# Patient Record
Sex: Female | Born: 1965
Health system: Southern US, Community
[De-identification: ages and names within clinical notes are randomized; demographics above are authoritative.]

## PROBLEM LIST (undated history)

## (undated) DIAGNOSIS — E119 Type 2 diabetes mellitus without complications: Secondary | ICD-10-CM

## (undated) DIAGNOSIS — N189 Chronic kidney disease, unspecified: Secondary | ICD-10-CM

## (undated) DIAGNOSIS — F419 Anxiety disorder, unspecified: Secondary | ICD-10-CM

## (undated) DIAGNOSIS — D649 Anemia, unspecified: Secondary | ICD-10-CM

## (undated) DIAGNOSIS — I10 Essential (primary) hypertension: Secondary | ICD-10-CM

## (undated) HISTORY — DX: Chronic kidney disease, unspecified: N18.9

## (undated) HISTORY — DX: Type 2 diabetes mellitus without complications: E11.9

## (undated) HISTORY — DX: Anxiety disorder, unspecified: F41.9

## (undated) HISTORY — DX: Anemia, unspecified: D64.9

## (undated) HISTORY — PX: OTHER SURGICAL HISTORY: SHX169

---

## 1998-10-19 ENCOUNTER — Encounter: Payer: Self-pay | Admitting: Internal Medicine

## 1998-10-19 ENCOUNTER — Ambulatory Visit (HOSPITAL_COMMUNITY): Admission: RE | Admit: 1998-10-19 | Discharge: 1998-10-19 | Payer: Self-pay | Admitting: Internal Medicine

## 1999-01-21 ENCOUNTER — Encounter: Payer: Self-pay | Admitting: Internal Medicine

## 1999-01-21 ENCOUNTER — Ambulatory Visit (HOSPITAL_COMMUNITY): Admission: RE | Admit: 1999-01-21 | Discharge: 1999-01-21 | Payer: Self-pay | Admitting: Internal Medicine

## 2000-10-23 ENCOUNTER — Other Ambulatory Visit: Admission: RE | Admit: 2000-10-23 | Discharge: 2000-10-23 | Payer: Self-pay | Admitting: *Deleted

## 2000-11-21 ENCOUNTER — Other Ambulatory Visit: Admission: RE | Admit: 2000-11-21 | Discharge: 2000-11-21 | Payer: Self-pay | Admitting: *Deleted

## 2000-11-21 ENCOUNTER — Encounter (INDEPENDENT_AMBULATORY_CARE_PROVIDER_SITE_OTHER): Payer: Self-pay

## 2001-12-22 ENCOUNTER — Other Ambulatory Visit: Admission: RE | Admit: 2001-12-22 | Discharge: 2001-12-22 | Payer: Self-pay | Admitting: Obstetrics and Gynecology

## 2004-06-02 ENCOUNTER — Other Ambulatory Visit: Admission: RE | Admit: 2004-06-02 | Discharge: 2004-06-02 | Payer: Self-pay | Admitting: Obstetrics and Gynecology

## 2005-02-26 ENCOUNTER — Ambulatory Visit: Payer: Self-pay | Admitting: Internal Medicine

## 2005-06-07 ENCOUNTER — Other Ambulatory Visit: Admission: RE | Admit: 2005-06-07 | Discharge: 2005-06-07 | Payer: Self-pay | Admitting: Obstetrics and Gynecology

## 2005-06-14 ENCOUNTER — Ambulatory Visit (HOSPITAL_COMMUNITY): Admission: RE | Admit: 2005-06-14 | Discharge: 2005-06-14 | Payer: Self-pay | Admitting: Obstetrics and Gynecology

## 2005-09-07 ENCOUNTER — Ambulatory Visit: Payer: Self-pay | Admitting: Internal Medicine

## 2006-02-13 ENCOUNTER — Ambulatory Visit: Payer: Self-pay | Admitting: Internal Medicine

## 2006-10-14 ENCOUNTER — Ambulatory Visit: Payer: Self-pay | Admitting: Internal Medicine

## 2007-04-16 ENCOUNTER — Ambulatory Visit: Payer: Self-pay | Admitting: Internal Medicine

## 2007-06-24 ENCOUNTER — Ambulatory Visit: Payer: Self-pay | Admitting: Internal Medicine

## 2007-06-24 LAB — CONVERTED CEMR LAB
AST: 20 units/L (ref 0–37)
Albumin: 3.5 g/dL (ref 3.5–5.2)
Basophils Relative: 0.5 % (ref 0.0–1.0)
Bilirubin, Direct: 0.1 mg/dL (ref 0.0–0.3)
CO2: 29 meq/L (ref 19–32)
Chloride: 99 meq/L (ref 96–112)
Creatinine, Ser: 0.9 mg/dL (ref 0.4–1.2)
Eosinophils Relative: 1.3 % (ref 0.0–5.0)
Glucose, Bld: 94 mg/dL (ref 70–99)
HCT: 37.5 % (ref 36.0–46.0)
Hemoglobin, Urine: NEGATIVE
Hemoglobin: 12.5 g/dL (ref 12.0–15.0)
Iron: 38 ug/dL — ABNORMAL LOW (ref 42–145)
Ketones, ur: NEGATIVE mg/dL
Leukocytes, UA: NEGATIVE
MCHC: 33.3 g/dL (ref 30.0–36.0)
Monocytes Absolute: 0.6 10*3/uL (ref 0.2–0.7)
Neutrophils Relative %: 63.2 % (ref 43.0–77.0)
Nitrite: NEGATIVE
Potassium: 3.7 meq/L (ref 3.5–5.1)
RBC: 4.38 M/uL (ref 3.87–5.11)
RDW: 16.3 % — ABNORMAL HIGH (ref 11.5–14.6)
Sodium: 135 meq/L (ref 135–145)
Total Bilirubin: 0.4 mg/dL (ref 0.3–1.2)
Total Protein: 6.9 g/dL (ref 6.0–8.3)
Urobilinogen, UA: 0.2 (ref 0.0–1.0)
Vit D, 1,25-Dihydroxy: 52 (ref 20–57)
Vitamin B-12: 317 pg/mL (ref 211–911)
WBC: 5.1 10*3/uL (ref 4.5–10.5)
pH: 6 (ref 5.0–8.0)

## 2007-07-30 ENCOUNTER — Ambulatory Visit: Payer: Self-pay | Admitting: Internal Medicine

## 2007-10-27 ENCOUNTER — Telehealth: Payer: Self-pay | Admitting: Internal Medicine

## 2007-11-17 ENCOUNTER — Telehealth: Payer: Self-pay | Admitting: Internal Medicine

## 2007-11-18 ENCOUNTER — Ambulatory Visit: Payer: Self-pay | Admitting: Internal Medicine

## 2007-11-18 DIAGNOSIS — I10 Essential (primary) hypertension: Secondary | ICD-10-CM | POA: Insufficient documentation

## 2007-11-18 DIAGNOSIS — D259 Leiomyoma of uterus, unspecified: Secondary | ICD-10-CM | POA: Insufficient documentation

## 2008-03-30 ENCOUNTER — Encounter: Admission: RE | Admit: 2008-03-30 | Discharge: 2008-03-30 | Payer: Self-pay | Admitting: Obstetrics and Gynecology

## 2008-04-08 ENCOUNTER — Encounter: Admission: RE | Admit: 2008-04-08 | Discharge: 2008-04-08 | Payer: Self-pay | Admitting: Interventional Radiology

## 2008-05-27 ENCOUNTER — Telehealth: Payer: Self-pay | Admitting: Internal Medicine

## 2008-05-28 ENCOUNTER — Telehealth (INDEPENDENT_AMBULATORY_CARE_PROVIDER_SITE_OTHER): Payer: Self-pay | Admitting: *Deleted

## 2008-05-28 ENCOUNTER — Ambulatory Visit: Payer: Self-pay | Admitting: Internal Medicine

## 2008-05-28 ENCOUNTER — Ambulatory Visit (HOSPITAL_COMMUNITY): Admission: RE | Admit: 2008-05-28 | Discharge: 2008-05-28 | Payer: Self-pay | Admitting: Interventional Radiology

## 2008-06-09 ENCOUNTER — Telehealth (INDEPENDENT_AMBULATORY_CARE_PROVIDER_SITE_OTHER): Payer: Self-pay | Admitting: *Deleted

## 2008-06-18 ENCOUNTER — Ambulatory Visit (HOSPITAL_COMMUNITY): Admission: RE | Admit: 2008-06-18 | Discharge: 2008-06-19 | Payer: Self-pay | Admitting: Interventional Radiology

## 2008-07-06 ENCOUNTER — Encounter: Admission: RE | Admit: 2008-07-06 | Discharge: 2008-07-06 | Payer: Self-pay | Admitting: Interventional Radiology

## 2008-12-28 ENCOUNTER — Encounter: Admission: RE | Admit: 2008-12-28 | Discharge: 2008-12-28 | Payer: Self-pay | Admitting: Interventional Radiology

## 2009-01-18 ENCOUNTER — Telehealth: Payer: Self-pay | Admitting: Internal Medicine

## 2009-01-21 ENCOUNTER — Ambulatory Visit: Payer: Self-pay | Admitting: Internal Medicine

## 2009-05-28 ENCOUNTER — Telehealth: Payer: Self-pay | Admitting: Family Medicine

## 2009-05-30 ENCOUNTER — Telehealth: Payer: Self-pay | Admitting: Internal Medicine

## 2009-07-25 ENCOUNTER — Telehealth: Payer: Self-pay | Admitting: Internal Medicine

## 2009-07-27 ENCOUNTER — Ambulatory Visit: Payer: Self-pay | Admitting: Internal Medicine

## 2009-07-27 DIAGNOSIS — M542 Cervicalgia: Secondary | ICD-10-CM | POA: Insufficient documentation

## 2009-07-27 DIAGNOSIS — R071 Chest pain on breathing: Secondary | ICD-10-CM | POA: Insufficient documentation

## 2010-10-13 ENCOUNTER — Ambulatory Visit: Payer: Self-pay | Admitting: Internal Medicine

## 2010-10-13 LAB — CONVERTED CEMR LAB
Albumin: 3.5 g/dL (ref 3.5–5.2)
Alkaline Phosphatase: 73 units/L (ref 39–117)
CO2: 31 meq/L (ref 19–32)
Calcium: 9 mg/dL (ref 8.4–10.5)
Chloride: 103 meq/L (ref 96–112)
Creatinine, Ser: 0.7 mg/dL (ref 0.4–1.2)
Glucose, Bld: 82 mg/dL (ref 70–99)
Hgb A1c MFr Bld: 6.1 % (ref 4.6–6.5)
LDL Cholesterol: 62 mg/dL (ref 0–99)
Total Bilirubin: 0.2 mg/dL — ABNORMAL LOW (ref 0.3–1.2)
Total CHOL/HDL Ratio: 2
Triglycerides: 35 mg/dL (ref 0.0–149.0)

## 2010-10-17 ENCOUNTER — Encounter: Payer: Self-pay | Admitting: Internal Medicine

## 2010-11-06 ENCOUNTER — Telehealth: Payer: Self-pay | Admitting: Internal Medicine

## 2010-12-24 ENCOUNTER — Encounter: Payer: Self-pay | Admitting: Interventional Radiology

## 2011-01-04 NOTE — Letter (Signed)
[facility] [address] [place], [place]  [address] Phone: [phone]      October 17, 2010   [facility] [address] [place], [place] [address]  RE:  LAB RESULTS  Dear  Sydney [name],  The following is an interpretation of your most recent lab tests.  Please take note of any instructions provided or changes to medications that have resulted from your lab work.  ELECTROLYTES:  Good - no changes needed  KIDNEY FUNCTION TESTS:  Good - no changes needed  LIVER FUNCTION TESTS:  Good - no changes needed  Health professionals look at cholesterol as more involved than just the total cholesterol. We consider the level of LDL (bad) cholesterol, HDL (good), cholesterol, and Triglycerides (Grease) in the blood.  1. Your LDL should be under 100, and the HDL should be over 45, if you have any vascular disease such as heart attack, angina, stroke, TIA (mini stroke), claudication (pain in the legs when you walk due to poor circulation),  Abdominal Aortic Aneurysm (AAA), diabetes or prediabetes.  2. Your LDL should be under 130 if you have any two of the following:     a. Smoke or chew tobacco,     b. High blood pressure (if you are on medication or over 140/90 without medication),     c. Female gender,    d. HDL below 40,    e. A female relative (father, brother, or son), who have had any vascular event          as described in #1. above under the age of 57, or a female relative (mother,       sister, or daughter) who had an event as described above under age 64. (An HDL over 60 will subtract one risk factor from the total, so if you have two items in # 2 above, but an HDL over 60, you then fall into category # 3 below).  3. Your LDL should be under 160 if you have any one of the above.  Triglycerides should be under 200 with the ideal being under 150.  For diabetes or pre-diabetes, the ideal HgbA1C should be under 6.0%.  If you fall into any of the above  categories, you should make a follow up appointment to discuss this with your physician.  LIPID PANEL:  Good - no changes needed Triglyceride: 35.0   Cholesterol: 172   LDL: 62   HDL: 103.30   Chol/HDL%:  2   DIABETIC STUDIES:  Excellent - no changes needed Blood Glucose: 82   HgbA1C: 6.1     Lab results are NORMAL.  For any questions please make an appointment with Dr. Posey Rea.   Sincerely Yours,    Jacques Navy MD Patient: Sydney [name] Note: All result statuses are Final unless otherwise noted.  Tests: (1) BMP (METABOL)   Sodium                    138 mEq/L                   135-145   Potassium                 4.2 mEq/L                   3.5-5.1   Chloride                  103 mEq/L  96-112   Carbon Dioxide            31 mEq/L                    19-32   Glucose                   82 mg/dL                    09-81   BUN                       18 mg/dL                    1-91   Creatinine                0.7 mg/dL                   4.7-8.2   Calcium                   9.0 mg/dL                   9.5-62.1   GFR                       114.06 mL/min               >60  Tests: (2) Lipid Panel (LIPID)   Cholesterol               172 mg/dL                   3-086     ATP III Classification            Desirable:  < 200 mg/dL                    Borderline High:  200 - 239 mg/dL               High:  > = 240 mg/dL   Triglycerides             35.0 mg/dL                  5.7-846.9     Normal:  <150 mg/dL     Borderline High:  629 - 199 mg/dL   HDL                       528.41 mg/dL                >32.44   VLDL Cholesterol          7.0 mg/dL                   0.1-02.7   LDL Cholesterol           62 mg/dL                    2-53  CHO/HDL Ratio:  CHD Risk                             2                    Men          Women  1/2 Average Risk     3.4          3.3     Average Risk          5.0          4.4     2X Average Risk          9.6          7.1     3X  Average Risk          15.0          11.0                           Tests: (3) Hepatic/Liver Function Panel (HEPATIC)   Total Bilirubin      [L]  0.2 mg/dL                   1.6-1.0   Direct Bilirubin          0.0 mg/dL                   9.6-0.4   Alkaline Phosphatase      73 U/L                      39-117   AST                       15 U/L                      0-37   ALT                       11 U/L                      0-35   Total Protein             7.5 g/dL                    5.4-0.9   Albumin                   3.5 g/dL                    8.1-1.9  Tests: (4) Hemoglobin A1C (A1C)   Hemoglobin A1C            6.1 %                       4.6-6.5     Glycemic Control Guidelines for People with Diabetes:     Non Diabetic:  <6%     Goal of Therapy: <7%     Additional Action Suggested:  >8%

## 2011-01-04 NOTE — Assessment & Plan Note (Signed)
Summary: ?blood pressure/plot/lb   Vital Signs:  Patient profile:   45 year old female Height:      62 inches Weight:      140 pounds BMI:     25.70 O2 Sat:      98 % on Room air Temp:     98.2 degrees F oral Pulse rate:   60 / minute Pulse rhythm:   regular BP sitting:   130 / 80  (left arm) Cuff size:   large  Vitals Entered By: Rock Nephew CMA (October 13, 2010 11:02 AM)  O2 Flow:  Room air CC: Patient would like to discuss restart BP meds   Primary Care Anmol Paschen:  Tresa Garter MD  CC:  Patient would like to discuss restart BP meds.  History of Present Illness: Patient presents to refill BP meds. She has  been off medication for about a year, never filled Rx from last August. she has  been doing OK. She has not checked her BP in the interval. She has  been asymptomatic. Chart reviewed: at previous visits over the past two years her SBP 117-120, with DBP 80s. Today BP is 13/80. she had tolerated Avalide without problems.  She reports that she will get very shaky  between meals and is relieved when she take some form of concentrated sweet. No documented hypoglycemia. She does have a family history of DM  Patient's last labs were in '08 and at surgery '09. No lipid panel available.  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    Physical Exam  General:  WNWD woman in no distress Head:  normocephalic and atraumatic.   Eyes:  pupils equal and pupils round.  C&S clear Neck:  supple.   Lungs:  normal respiratory effort.   Heart:  normal rate and regular rhythm.   Msk:  normal ROM.   Pulses:  2+ radial Extremities:  No clubbing, cyanosis, edema, or deformity noted with normal full range of motion of all joints.   Neurologic:  alert & oriented X3, cranial nerves II-XII intact, and gait normal.   Skin:  turgor normal, color normal, and no rashes.   Psych:  Oriented X3, normally interactive, and good eye contact.     Impression & Recommendations:  Problem # 1:  HYPERTENSION (ICD-401.9) Patient with prehypertension that was previously well controlled.  Plan - continue ARB/Hct but use generic product, i.e. losartan/hct           Follow-up with Dr. AVP  Her updated medication list for this problem includes:    Hyzaar 100-25 Mg Tabs (Losartan potassium-hctz) .Marland Kitchen... Take 1 tab by mouth daily  Orders: TLB-BMP (Basic Metabolic Panel-BMET) (80048-METABOL)  Problem # 2:  Preventive Health Care (ICD-V70.0) Current with gyn. She is due for lipid panel. Due to reports of what may be hypoglycemia and a family h/o DM will check A1C   Orders: TLB-Lipid Panel (80061-LIPID) TLB-Hepatic/Liver Function Pnl (80076-HEPATIC) TLB-A1C / Hgb A1C (Glycohemoglobin) (  04540-J8J)  Patient: Sydney [name] Note: All result statuses are Final unless otherwise noted.  Tests: (1) BMP (METABOL)   Sodium                    138 mEq/L                   135-145   Potassium                 4.2 mEq/L                   3.5-5.1   Chloride                  103 mEq/L                   96-112   Carbon Dioxide            31 mEq/L                    19-32   Glucose                   82 mg/dL                    19-14   BUN                       18 mg/dL                    7-82   Creatinine                 0.7 mg/dL                   9.5-6.2   Calcium                   9.0 mg/dL                   1.3-08.6   GFR                       114.06 mL/min               >60  Tests: (2) Lipid Panel (LIPID)   Cholesterol               172 mg/dL                   5-784   Triglycerides             35.0 mg/dL                  6.9-629.5     Normal:  <150 mg/dL     Borderline High:  284 - 199 mg/dL   HDL                       132.44 mg/dL                >01.02   VLDL Cholesterol          7.0 mg/dL                   7.2-53.6   LDL Cholesterol           62 mg/dL  0-99  A1C pending  Complete Medication List: 1)  Vitamin D3 1000 Unit Tabs (Cholecalciferol) .Marland Kitchen.. 1 qd 2)  Hyzaar 100-25 Mg Tabs (Losartan potassium-hctz) .... Take 1 tab by mouth daily 3)  Naprosyn 500 Mg Tabs (Naproxen) .Marland Kitchen.. 1 two times a day pc as needed pain Prescriptions: HYZAAR 100-25 MG TABS (LOSARTAN POTASSIUM-HCTZ) Take 1 tab by mouth daily  #90 x 3   Entered and Authorized by:   Jacques Navy MD   Signed by:   Jacques Navy MD on 10/13/2010   Method used:   Electronically to        [facility]  [phone]* (retail)       [address]       [place], [place]  [address]       Ph: [phone] or [phone]       Fax: [phone]   RxID:   [id number]    Orders Added: 1)  TLB-BMP (Basic Metabolic Panel-BMET) [80048-METABOL] 2)  TLB-Lipid Panel [80061-LIPID] 3)  TLB-Hepatic/Liver Function Pnl [80076-HEPATIC] 4)  TLB-A1C / Hgb A1C (Glycohemoglobin) [83036-A1C] 5)  Est. Patient Level III [36644]

## 2011-01-04 NOTE — Progress Notes (Signed)
Summary: BP MED   Phone Note Call from Patient   Summary of Call: Pt was given different rx at last office visit which was w/Dr Norins b/c Plot was out of the office. She has not started the hyzaar and would like Dr Posey Rea to confirm he agrees w/the new med.  Initial call taken by: Lamar Sprinkles, CMA,  November 06, 2010 3:09 PM  Follow-up for Phone Call        I agree. Is BP nl? Follow-up by: Tresa Garter MD,  November 06, 2010 6:10 PM  Additional Follow-up for Phone Call Additional follow up Details #1::        Mailbox full  Additional Follow-up by: Lamar Sprinkles, CMA,  November 07, 2010 8:42 AM    Additional Follow-up for Phone Call Additional follow up Details #2::    Called pt gave md response. Pt is very concern about starting the new BP meds. She states when she was taking Avalide was working fine for her. Want to know if Dr. Posey Rea would have rx hyzaar for her if she saw him on that day. If not req to go back on avalide.    Follow-up by: Orlan Leavens RMA,  November 07, 2010 12:50 PM  Additional Follow-up for Phone Call Additional follow up Details #3:: Details for Additional Follow-up Action Taken: I think Dr Debby Bud was trying to save her money. Any med that controls her BP and has no side efeects on her is suitable. We can go back to Avalide if she feels more comfortable with it... Georgina Quint Plotnikov MD  November 07, 2010 1:14 PM   Pt informed, she wants avalide. No samples avail but there is 14 day trial & seperate discount card. Pt was on 300/25 1/2 daily but rx was written for 1 once daily. Avalide does not come in this strength any longer. Rx's ready w/savings cards. Pt aware to pick up...............Marland KitchenLamar Sprinkles, CMA  November 07, 2010 5:50 PM  Agree. Thank you!  Georgina Quint Plotnikov MD  November 07, 2010 6:11 PM   New/Updated Medications: AVALIDE 150-12.5 MG TABS (IRBESARTAN-HYDROCHLOROTHIAZIDE) 1 once daily Prescriptions: AVALIDE 150-12.5 MG TABS  (IRBESARTAN-HYDROCHLOROTHIAZIDE) 1 once daily  #30 x 6   Entered by:   Lamar Sprinkles, CMA   Authorized by:   Tresa Garter MD   Signed by:   Lamar Sprinkles, CMA on 11/07/2010   Method used:   Print then Give to Patient   RxID:   [id number] AVALIDE 150-12.5 MG TABS (IRBESARTAN-HYDROCHLOROTHIAZIDE) 1 once daily  #14 x 0   Entered by:   Lamar Sprinkles, CMA   Authorized by:   Tresa Garter MD   Signed by:   Lamar Sprinkles, CMA on 11/07/2010   Method used:   Print then Give to Patient   RxID:   [id number]

## 2011-04-20 NOTE — Assessment & Plan Note (Signed)
[facility]                                 ON-CALL NOTE   NAME:[name], Sydney                      MRN:          [id number]  DATE:10/29/2007                            DOB:          06/16/1972    Patient of Dr. Posey Rea   PHONE NUMBER:  [phone]   SUBJECTIVE:  The patient called with a concern regarding her blood  pressure medication.  She had a prescription called in by her doctor's  office for Avalide 150/25.  She was given samples to tide her over in  the meantime, but was given the wrong dose and was given 300/12.5 mg.  She wants to know whether she can take this medication.   ASSESSMENT:  I instructed her to take half tablet for the next two days  and once she can pick up her medication, she will return to her exact  dose.     Kerby Nora, MD  Electronically Signed    AB/MedQ  DD: 10/29/2007  DT: 10/29/2007  Job #: [id number]

## 2011-05-30 ENCOUNTER — Telehealth: Payer: Self-pay | Admitting: *Deleted

## 2011-05-30 NOTE — Telephone Encounter (Signed)
Agree. Thx 

## 2011-05-30 NOTE — Telephone Encounter (Signed)
Spoke w/pt - She woke up yesterday w/h/a that lasted all day. Per pt, it is rare that she has h/a. She checked BP at pharmacy, it was 145/112. She had eaten and drank very little that day. Pt took 500 mg tylenol and slept all night - woke up w/no h/a. I advised her to monitor bp on daily basis whether she had h/a or not. If BP is 140/90 or above to contact office for eval - she agreed.

## 2011-08-30 LAB — CBC
HCT: 37
Hemoglobin: 12.3
RBC: 4.29

## 2011-08-30 LAB — CREATININE, SERUM
Creatinine, Ser: 0.85
GFR calc non Af Amer: >60

## 2011-08-31 LAB — HCG, SERUM, QUALITATIVE: Preg, Serum: NEGATIVE

## 2011-12-04 LAB — HM PAP SMEAR

## 2011-12-04 LAB — HM MAMMOGRAPHY

## 2012-05-28 ENCOUNTER — Emergency Department (HOSPITAL_COMMUNITY): Payer: Federal, State, Local not specified - PPO

## 2012-05-28 ENCOUNTER — Emergency Department (HOSPITAL_COMMUNITY)
Admission: EM | Admit: 2012-05-28 | Discharge: 2012-05-28 | Disposition: A | Discharge status: Home / Self Care | Payer: Federal, State, Local not specified - PPO | Attending: Emergency Medicine | Admitting: Emergency Medicine

## 2012-05-28 ENCOUNTER — Encounter (HOSPITAL_COMMUNITY): Payer: Self-pay | Admitting: Emergency Medicine

## 2012-05-28 ENCOUNTER — Telehealth: Payer: Self-pay | Admitting: Internal Medicine

## 2012-05-28 DIAGNOSIS — G4700 Insomnia, unspecified: Secondary | ICD-10-CM | POA: Insufficient documentation

## 2012-05-28 DIAGNOSIS — R51 Headache: Secondary | ICD-10-CM | POA: Insufficient documentation

## 2012-05-28 DIAGNOSIS — I10 Essential (primary) hypertension: Secondary | ICD-10-CM | POA: Insufficient documentation

## 2012-05-28 HISTORY — DX: Essential (primary) hypertension: I10

## 2012-05-28 LAB — POCT I-STAT, CHEM 8
HCT: 40 % (ref 36.0–46.0)
Hemoglobin: 13.6 g/dL (ref 12.0–15.0)
Potassium: 3.7 mEq/L (ref 3.5–5.1)
Sodium: 139 mEq/L (ref 135–145)
TCO2: 24 mmol/L (ref 0–100)

## 2012-05-28 LAB — URINALYSIS, ROUTINE W REFLEX MICROSCOPIC
Leukocytes, UA: NEGATIVE
Nitrite: NEGATIVE
Protein, ur: NEGATIVE mg/dL
Urobilinogen, UA: 0.2 mg/dL (ref 0.0–1.0)

## 2012-05-28 MED ORDER — HYDROCHLOROTHIAZIDE 25 MG PO TABS: 25.0000 mg | ORAL_TABLET | Freq: Every day | ORAL | Status: DC

## 2012-05-28 NOTE — Telephone Encounter (Signed)
Caller: Dawanda/Patient; PCP: Sonda Primes; CB#: [phone]; ; ; Call regarding Headache, Insomnia; discontinued hbp medication 2 years ago on advice of Seymour Elam MD.  Onset of several consecutive nights not sleeping well, and then headache began 05/27/12 PM.  Continues to have headache 05/28/12.  Noted an episode of blurred vision 1445 05/28/12.  Per protocol, advised 911; patient declines, but states she will go to ED.

## 2012-05-28 NOTE — ED Provider Notes (Signed)
History     CSN: [id number]  Arrival date & time 05/28/12  9147   First MD Initiated Contact with Patient 05/28/12 2011      Chief Complaint  Patient presents with  . Headache  . Insomnia   HPI  History provided by the patient. Patient is a 46 year old female with history of hypertension presents with complaints of brief episodic blurry vision earlier today. Patient states that she was sitting down at her computer and working for a little while she had a brief episode of blurry vision in both eyes. She got up to walk to the kitchen to get something to drink. Symptoms lasted 10-20 minutes. Patient felt concerned she's never had symptoms like this before patient also states that she has history of hypertension and was formally treated for this by Dr. Jennette Banker of put on one of her last visits another physician told her she no longer needed blood pressure medications and she has been off medicine for the last few years. Patient was concerned that her vision symptoms may be related to her elevated blood pressure. Patient currently denies any other symptoms. She denies any headache, dizziness, confusion, speech difficulty, weakness or numbness in extremities, chest pain, heart palpitations or shortness of breath. Patient is a nonsmoker. No history of diabetes. Symptoms are described as mild. She denies any other aggravating or alleviating factors. Denies any other associated symptoms.    Past Medical History  Diagnosis Date  . Hypertension     History reviewed. No pertinent past surgical history.  No family history on file.  History  Substance Use Topics  . Smoking status: Never Smoker   . Smokeless tobacco: Not on file  . Alcohol Use: No    OB History    Grav Para Term Preterm Abortions TAB SAB Ect Mult Living                  Review of Systems  Constitutional: Negative for fever and chills.  HENT: Negative for neck pain.   Eyes: Positive for visual disturbance. Negative for  pain and redness.  Respiratory: Negative for cough and shortness of breath.   Cardiovascular: Negative for chest pain and palpitations.  Gastrointestinal: Negative for nausea, vomiting and abdominal pain.  Neurological: Negative for dizziness, light-headedness and headaches.    Allergies  Enalapril maleate  Home Medications   Current Outpatient Rx  Name Route Sig Dispense Refill  . CALCIUM CARBONATE-VITAMIN D 500-200 MG-UNIT PO TABS Oral Take 1 tablet by mouth daily.    Marland Kitchen VITAMIN D 1000 UNITS PO TABS Oral Take 1,000 Units by mouth daily.    . OMEGA-3 FATTY ACIDS 1000 MG PO CAPS Oral Take 1 g by mouth daily.    Marland Kitchen VITAMIN B-6 250 MG PO TABS Oral Take 250 mg by mouth daily.    Marland Kitchen VITAMIN A 82956 UNITS PO CAPS Oral Take 10,000 Units by mouth daily.    Marland Kitchen VITAMIN B-12 1000 MCG PO TABS Oral Take 1,000 mcg by mouth daily.    Marland Kitchen VITAMIN C 500 MG PO TABS Oral Take 500 mg by mouth daily.    Marland Kitchen VITAMIN E 100 UNITS PO CAPS Oral Take 100 Units by mouth daily.    Marland Kitchen ZINC GLUCONATE 50 MG PO TABS Oral Take 50 mg by mouth daily.      BP 157/102  Pulse 57  Temp 98.8 F (37.1 C) (Oral)  Resp 16  Ht 5\' 2"  (1.575 m)  SpO2 98%  LMP 05/04/2012  Physical Exam  Nursing note and vitals reviewed. Constitutional: She is oriented to person, place, and time. She appears well-developed and well-nourished. No distress.  HENT:  Head: Normocephalic and atraumatic.  Mouth/Throat: Oropharynx is clear and moist.  Eyes: Conjunctivae and EOM are normal. Pupils are equal, round, and reactive to light.  Neck: Normal range of motion. Neck supple. Carotid bruit is not present.  Cardiovascular: Normal rate and regular rhythm.   No murmur heard. Pulmonary/Chest: Effort normal and breath sounds normal. No respiratory distress. She has no wheezes. She has no rales.  Abdominal: Soft. There is no tenderness.  Neurological: She is alert and oriented to person, place, and time. She has normal strength. No cranial nerve  deficit or sensory deficit. Coordination normal.  Skin: Skin is warm and dry. No rash noted.  Psychiatric: She has a normal mood and affect. Her behavior is normal.    ED Course  Procedures   Results for orders placed during the hospital encounter of 05/28/12  POCT I-STAT, CHEM 8      Component Value Range   Sodium 139  135 - 145 mEq/L   Potassium 3.7  3.5 - 5.1 mEq/L   Chloride 103  96 - 112 mEq/L   BUN 12  6 - 23 mg/dL   Creatinine, Ser 1.61  0.50 - 1.10 mg/dL   Glucose, Bld 85  70 - 99 mg/dL   Calcium, Ion 0.96  0.45 - 1.32 mmol/L   TCO2 24  0 - 100 mmol/L   Hemoglobin 13.6  12.0 - 15.0 g/dL   HCT 40.9  81.1 - 91.4 %  URINALYSIS, ROUTINE W REFLEX MICROSCOPIC      Component Value Range   Color, Urine YELLOW  YELLOW   APPearance CLEAR  CLEAR   Specific Gravity, Urine 1.017  1.005 - 1.030   pH 7.0  5.0 - 8.0   Glucose, UA NEGATIVE  NEGATIVE mg/dL   Hgb urine dipstick NEGATIVE  NEGATIVE   Bilirubin Urine NEGATIVE  NEGATIVE   Ketones, ur NEGATIVE  NEGATIVE mg/dL   Protein, ur NEGATIVE  NEGATIVE mg/dL   Urobilinogen, UA 0.2  0.0 - 1.0 mg/dL   Nitrite NEGATIVE  NEGATIVE   Leukocytes, UA NEGATIVE  NEGATIVE  PREGNANCY, URINE      Component Value Range   Preg Test, Ur NEGATIVE  NEGATIVE     Ct Head Wo Contrast  05/28/2012  *RADIOLOGY REPORT*  Clinical Data: 46 year old female with headache and insomnia.  CT HEAD WITHOUT CONTRAST  Technique:  Contiguous axial images were obtained from the base of the skull through the vertex without contrast.  Comparison: None.  Findings: Visualized paranasal sinuses and mastoids are clear.  No acute osseous abnormality identified.  Visualized orbits and scalp soft tissues are within normal limits.  Cerebral volume is within normal limits for age.  No midline shift, ventriculomegaly, mass effect, evidence of mass lesion, intracranial hemorrhage or evidence of cortically based acute infarction.  Gray-white matter differentiation is within normal  limits throughout the brain.  No suspicious intracranial vascular hyperdensity.  IMPRESSION: Normal noncontrast CT appearance of the brain.  Original Report Authenticated By: Harley Hallmark, M.D.     1. Hypertension       MDM  8:50PM patient seen and evaluated. Patient in no acute distress. Patient currently without any symptoms. Symptoms were brief lasting 20 minutes or less.   ABCD2 Score = 2 and low risk (BP >140/90, duration of symptoms 10-83min)  Patient discussed with attending  physician. Labs and CT scan unremarkable. At this time patient to able to return home. She agrees with this plan. We'll prescribe HCTZ for blood pressure.     Angus Seller, [place] 05/29/12 [phone]

## 2012-05-28 NOTE — Discharge Instructions (Signed)
You were seen and evaluated for your symptoms of blurry vision. Your lab tests and CAT scan do not show any concerning causes of your symptoms. Your blood pressure was elevated today he given a prescription for blood pressure medicine to help with this. Please continue followup with your primary care provider for treatment of your blood pressure and medical conditions. Return for any worsening symptoms.   Hypertension Information As your heart beats, it forces blood through your arteries. This force is your blood pressure. If the pressure is too high, it is called hypertension (HTN) or high blood pressure. HTN is dangerous because you may have it and not know it. High blood pressure may mean that your heart has to work harder to pump blood. Your arteries may be narrow or stiff. The extra work puts you at risk for heart disease, stroke, and other problems.  Blood pressure consists of two numbers, a higher number over a lower, 110/72, for example. It is stated as "110 over 72." The ideal is below 120 for the top number (systolic) and under 80 for the bottom (diastolic).  You should pay close attention to your blood pressure if you have certain conditions such as:  Heart failure.   Prior heart attack.   Diabetes   Chronic kidney disease.   Prior stroke.   Multiple risk factors for heart disease.  To see if you have HTN, your blood pressure should be measured while you are seated with your arm held at the level of the heart. It should be measured at least twice. A one-time elevated blood pressure reading (especially in the Emergency Department) does not mean that you need treatment. There may be conditions in which the blood pressure is different between your right and left arms. It is important to see your caregiver soon for a recheck. Most people have essential hypertension which means that there is not a specific cause. This type of high blood pressure may be lowered by changing lifestyle factors  such as:  Stress.   Smoking.   Lack of exercise.   Excessive weight.   Drug/tobacco/alcohol use.   Eating less salt.  Most people do not have symptoms from high blood pressure until it has caused damage to the body. Effective treatment can often prevent, delay or reduce that damage. TREATMENT  Treatment for high blood pressure, when a cause has been identified, is directed at the cause. There are a large number of medications to treat HTN. These fall into several categories, and your caregiver will help you select the medicines that are best for you. Medications may have side effects. You should review side effects with your caregiver. If your blood pressure stays high after you have made lifestyle changes or started on medicines,   Your medication(s) may need to be changed.   Other problems may need to be addressed.   Be certain you understand your prescriptions, and know how and when to take your medicine.   Be sure to follow up with your caregiver within the time frame advised (usually within two weeks) to have your blood pressure rechecked and to review your medications.   If you are taking more than one medicine to lower your blood pressure, make sure you know how and at what times they should be taken. Taking two medicines at the same time can result in blood pressure that is too low.  Document Released: 01/22/2006 Document Revised: 08/01/2011 Document Reviewed: 01/29/2008 [facility] Patient Information 2012 [employer], [place].  RESOURCE GUIDE  Chronic Pain Problems: Contact [facility]  [phone] Patients need to be referred by their primary care doctor.  Insufficient Money for Medicine: Contact United Way:  call "211" or [facility] ([phone].  No Primary Care Doctor: - Call [facility] Connect  [phone] - can help you locate a primary care doctor that  accepts your insurance, provides certain services, etc. - Physician Referral Service-  [phone]  Agencies that provide inexpensive medical care: - [facility]  [phone] - [facility]  [phone] - [facility]  [phone] - Women's Clinic  [phone] - [facility]  [phone] [facility]  [phone]  Medicaid-accepting [facility] Providers: - [facility]- [address]  [phone], Mon-Fri 9am-7pm, Sat 9am-1pm - [facility]- [address] [place], [address] [place]  [phone] - [facility]- [address] [place]  [phone] [facility]- [address]  [phone] - Renaye Rakers- [address] [place], [address], [phone]  Only accepts [place] patients after they have their name  applied to their card  Self Pay (no insurance) in [place]: - Sickle Cell Patients: Dr Willey Blade, [facility]  [address] [place], [phone] - [facility]- [address] [place]  [phone]       [facility]- [address]       -     [facility]- see information above (Speak to [employer] if you do not have insurance)       -  [facility]- [address] [place], [phone]       -  [facility]- [address],  [phone]       -  [facility]- [address], [phone]       -  Dr Julio Sicks-  [address], [place], [phone]       -  [facility]- [address] [phone]       -  [facility]- [address] [phone], also [address] [phone]       -    [facility]- [address] [place], [phone], 1st & 3rd Saturday   every month, 10am-1pm  1) Find a Doctor and Pay Out of Pocket Although you won't have to find out who is covered by your insurance plan, it is a good idea to ask around and get recommendations. You will then need to call the office and  see if the doctor you have chosen will accept you as a new patient and what types of options they offer for patients who are self-pay. Some doctors offer discounts or will set up payment plans for their patients who do not have insurance, but you will need to ask so you aren't surprised when you get to your appointment.  2) Contact Your Local Health Department Not all health departments have doctors that can see patients for sick visits, but many do, so it is worth a call to see if yours does. If you don't know where your local health department is, you can check in your phone book. The CDC also has a tool to help you locate your state's health department, and many state websites also  have listings of all of their local health departments.  3) Find a Walk-in Clinic If your illness is not likely to be very severe or complicated, you may want to try a walk in clinic. These are popping up all over the country in pharmacies, drugstores, and shopping centers. They're usually staffed by nurse practitioners or physician assistants that have been trained to treat common illnesses and complaints. They're usually fairly quick and inexpensive. However, if you have serious medical issues or chronic medical problems, these are probably not your best option  STD Testing - [facility] Department of [facility] [place], STD Clinic, [address], [place], phone [phone] or [phone].  Monday - Friday, call for an appointment. [facility] Department of [employer], STD Clinic, [address], [place], phone [phone] or [phone].  Monday - Friday, call for an appointment.  Abuse/Neglect: [facility] Child Abuse Hotline 602-234-1165 [facility] Child Abuse Hotline 703 588 7813 (After Hours)  Emergency Shelter:  [facility] [phone]  Maternity Homes: - [facility] at [facility] [place] [facility] [phone] - [facility] 2061044389  MRSA Hotline #:   385 707 5755  [facility] Resources  [facility]  United Way [facility] Dept. [address]                 [address]         [address] [place] [address]  Blondell Reveal Phone:  [phone]                                  Phone:  [phone]                   Phone:  [phone]  [facility] Mental Health, [phone] - [facility] - [phone]       -     [facility] in [place], [address],                                  [phone], [facility] Child Abuse Hotline 9303082639 or (301) 565-0309 (After Hours)   Behavioral Health Services  Substance Abuse Resources: - [facility] Drug Services  ([phone] - [facility] [phone] - [facility] [phone] Floydene Flock [phone] - Residential & Outpatient Substance Abuse Program  [phone]  Psychological Services: [facility]  [phone] Services  [phone] - [facility], [phone] [place]. [address] [place], ACCESS LINE: (501)842-8304 or [phone], [employer]za  Dental Assistance  If unable to pay or uninsured, contact:  [facility] or [facility]. to become qualified for the adult dental clinic.  Patients with Medicaid: [facility] [phone] [address], [phone] [address], [phone]  If unable  to pay, or uninsured, contact [facility] (949)825-9251) or [facility] Department [phone] in [place], [phone] in [facility]) to become qualified for the adult dental clinic  Other Low-Cost Community Dental Services: - [facility]- [address] [place], [place], [place], [address], [phone], [phone], 2nd and 4th  Thursday of the month at 6:30am.  10 clients each day by appointment, can sometimes see walk-in patients if someone does not show for an appointment. [facility]- [address] [place], [place], [address], [phone] - [facility]- [address], [place], [place], [address], [phone] - [facility]- [phone] [facility] Department- [phone] [facility] Department- [phone]

## 2012-05-28 NOTE — ED Notes (Signed)
Pt alert, nad, c/o insomnia for weeks, h/a onset was yesterday, ambulates to triage, steady gait noted, speech clear, resp even unlabored, skin pwd

## 2012-05-28 NOTE — ED Notes (Signed)
Pt in c/o insomnia x2-3 weeks, also episode of headache and blurred vision yesterday, denies pain or symptoms at this time, states she has a history of hypertension and has been off medication x2 years, states she was taken off medication but has not followed up since that time. Pt states she has a history of insomnia and it occurs multiple times a year and usually resolves without medication. Pt here after contacting primary MD and told to come in due to history of hypertension.

## 2012-05-29 LAB — PREGNANCY, URINE: Preg Test, Ur: NEGATIVE

## 2012-05-29 NOTE — Telephone Encounter (Signed)
Noted. Thx.

## 2012-05-31 NOTE — ED Provider Notes (Signed)
Medical screening examination/treatment/procedure(s) were performed by non-physician practitioner and as supervising physician I was immediately available for consultation/collaboration.   Breckyn Troyer M Aurilla Coulibaly, DO 05/31/12 1255 

## 2013-01-26 ENCOUNTER — Ambulatory Visit: Payer: Federal, State, Local not specified - PPO | Admitting: Internal Medicine

## 2013-02-02 ENCOUNTER — Ambulatory Visit: Payer: Federal, State, Local not specified - PPO | Admitting: Internal Medicine

## 2013-02-06 ENCOUNTER — Ambulatory Visit: Payer: Federal, State, Local not specified - PPO | Admitting: Internal Medicine

## 2013-02-19 ENCOUNTER — Ambulatory Visit: Payer: Federal, State, Local not specified - PPO | Admitting: Internal Medicine

## 2013-03-13 ENCOUNTER — Encounter: Payer: Self-pay | Admitting: Internal Medicine

## 2013-03-13 ENCOUNTER — Other Ambulatory Visit (INDEPENDENT_AMBULATORY_CARE_PROVIDER_SITE_OTHER): Payer: Federal, State, Local not specified - PPO

## 2013-03-13 ENCOUNTER — Ambulatory Visit (INDEPENDENT_AMBULATORY_CARE_PROVIDER_SITE_OTHER): Payer: Federal, State, Local not specified - PPO | Admitting: Internal Medicine

## 2013-03-13 VITALS — BP 150/100 | HR 80 | Temp 98.1°F | Resp 16 | Wt 151.0 lb

## 2013-03-13 DIAGNOSIS — H538 Other visual disturbances: Secondary | ICD-10-CM

## 2013-03-13 DIAGNOSIS — I10 Essential (primary) hypertension: Secondary | ICD-10-CM

## 2013-03-13 DIAGNOSIS — G4700 Insomnia, unspecified: Secondary | ICD-10-CM

## 2013-03-13 LAB — CBC WITH DIFFERENTIAL/PLATELET
Basophils Absolute: 0 10*3/uL (ref 0.0–0.1)
Eosinophils Relative: 0.5 % (ref 0.0–5.0)
HCT: 40.1 % (ref 36.0–46.0)
Hemoglobin: 13.7 g/dL (ref 12.0–15.0)
Lymphs Abs: 1.3 10*3/uL (ref 0.7–4.0)
MCV: 86.4 fl (ref 78.0–100.0)
Monocytes Absolute: 0.4 10*3/uL (ref 0.1–1.0)
Neutro Abs: 2.6 10*3/uL (ref 1.4–7.7)
Platelets: 219 10*3/uL (ref 150.0–400.0)
RDW: 14.1 % (ref 11.5–14.6)

## 2013-03-13 LAB — URINALYSIS
Bilirubin Urine: NEGATIVE
Ketones, ur: NEGATIVE
Leukocytes, UA: NEGATIVE
Nitrite: NEGATIVE
Urobilinogen, UA: 0.2 (ref 0.0–1.0)
pH: 6.5 (ref 5.0–8.0)

## 2013-03-13 LAB — TSH: TSH: 1.5 u[IU]/mL (ref 0.35–5.50)

## 2013-03-13 LAB — BASIC METABOLIC PANEL
BUN: 15 mg/dL (ref 6–23)
Chloride: 104 mEq/L (ref 96–112)
Glucose, Bld: 77 mg/dL (ref 70–99)
Potassium: 4.1 mEq/L (ref 3.5–5.1)
Sodium: 137 mEq/L (ref 135–145)

## 2013-03-13 LAB — HEPATIC FUNCTION PANEL
ALT: 27 U/L (ref 0–35)
AST: 25 U/L (ref 0–37)
Albumin: 4 g/dL (ref 3.5–5.2)
Total Bilirubin: 0.3 mg/dL (ref 0.3–1.2)

## 2013-03-13 MED ORDER — ZOLPIDEM TARTRATE 10 MG PO TABS: 10.0000 mg | ORAL_TABLET | Freq: Every evening | ORAL | Status: DC | PRN

## 2013-03-13 MED ORDER — LOSARTAN POTASSIUM-HCTZ 100-25 MG PO TABS: 1.0000 | ORAL_TABLET | Freq: Every day | ORAL | Status: DC

## 2013-03-13 NOTE — Assessment & Plan Note (Signed)
Needs an eye exam

## 2013-03-13 NOTE — Progress Notes (Signed)
  Subjective:    Patient ID: Sydney [name], female    DOB: 12-Apr-1966, 47 y.o.   MRN: [id number]  HPI  Not seen in 3 + years C/o insomnia - chronic, worse lately C/o elev BP   Review of Systems  Constitutional: Negative for chills, activity change, appetite change, fatigue and unexpected weight change.  HENT: Negative for congestion, mouth sores and sinus pressure.   Eyes: Negative for visual disturbance.  Respiratory: Negative for cough, chest tightness and shortness of breath.   Gastrointestinal: Negative for nausea and abdominal pain.  Genitourinary: Negative for urgency, frequency, vaginal discharge, difficulty urinating and vaginal pain.  Musculoskeletal: Negative for back pain and gait problem.  Skin: Negative for pallor and rash.  Neurological: Negative for dizziness, tremors, weakness, numbness and headaches.  Psychiatric/Behavioral: Negative for confusion and sleep disturbance.    Wt Readings from Last 3 Encounters:  03/13/13 151 lb (68.493 kg)  10/13/10 140 lb (63.504 kg)  07/27/09 137 lb (62.143 kg)   BP Readings from Last 3 Encounters:  03/13/13 150/100  05/28/12 152/103  10/13/10 130/80        Objective:   Physical Exam  Constitutional: She appears well-developed. No distress.  HENT:  Head: Normocephalic.  Right Ear: External ear normal.  Left Ear: External ear normal.  Nose: Nose normal.  Mouth/Throat: Oropharynx is clear and moist.  Eyes: Conjunctivae are normal. Pupils are equal, round, and reactive to light. Right eye exhibits no discharge. Left eye exhibits no discharge.  Neck: Normal range of motion. Neck supple. No JVD present. No tracheal deviation present. No thyromegaly present.  Cardiovascular: Normal rate, regular rhythm and normal heart sounds.   Pulmonary/Chest: No stridor. No respiratory distress. She has no wheezes.  Abdominal: Soft. Bowel sounds are normal. She exhibits no distension and no mass. There is no tenderness. There is no  rebound and no guarding.  Musculoskeletal: She exhibits no edema and no tenderness.  Lymphadenopathy:    She has no cervical adenopathy.  Neurological: She displays normal reflexes. No cranial nerve deficit. She exhibits normal muscle tone. Coordination normal.  Skin: No rash noted. No erythema.  Psychiatric: She has a normal mood and affect. Her behavior is normal. Judgment and thought content normal.          Assessment & Plan:

## 2013-03-13 NOTE — Assessment & Plan Note (Signed)
Labs See med change 

## 2013-03-13 NOTE — Assessment & Plan Note (Signed)
Discussed  Zolpide prn

## 2013-03-19 ENCOUNTER — Encounter: Payer: Self-pay | Admitting: Internal Medicine

## 2014-05-26 ENCOUNTER — Encounter: Payer: Self-pay | Admitting: Internal Medicine

## 2014-05-26 ENCOUNTER — Ambulatory Visit (INDEPENDENT_AMBULATORY_CARE_PROVIDER_SITE_OTHER): Payer: Federal, State, Local not specified - PPO | Admitting: Internal Medicine

## 2014-05-26 ENCOUNTER — Telehealth: Payer: Self-pay | Admitting: Internal Medicine

## 2014-05-26 VITALS — BP 178/112 | HR 68 | Temp 98.1°F | Resp 16 | Ht 62.0 in | Wt 157.0 lb

## 2014-05-26 DIAGNOSIS — I10 Essential (primary) hypertension: Secondary | ICD-10-CM

## 2014-05-26 DIAGNOSIS — G4700 Insomnia, unspecified: Secondary | ICD-10-CM

## 2014-05-26 MED ORDER — LOSARTAN POTASSIUM-HCTZ 100-25 MG PO TABS: 1.0000 | ORAL_TABLET | Freq: Every day | ORAL | Status: DC

## 2014-05-26 MED ORDER — SUVOREXANT 10 MG PO TABS: 10.0000 mg | ORAL_TABLET | Freq: Every evening | ORAL | Status: DC | PRN

## 2014-05-26 NOTE — Progress Notes (Signed)
  Subjective:    HPI   C/o insomnia - chronic; never took Ambien C/o elev BP - not taking Rx   Review of Systems  Constitutional: Negative for chills, activity change, appetite change, fatigue and unexpected weight change.  HENT: Negative for congestion, mouth sores and sinus pressure.   Eyes: Negative for visual disturbance.  Respiratory: Negative for cough, chest tightness and shortness of breath.   Gastrointestinal: Negative for nausea and abdominal pain.  Genitourinary: Negative for urgency, frequency, vaginal discharge, difficulty urinating and vaginal pain.  Musculoskeletal: Negative for back pain and gait problem.  Skin: Negative for pallor and rash.  Neurological: Negative for dizziness, tremors, weakness, numbness and headaches.  Psychiatric/Behavioral: Negative for confusion and sleep disturbance.    Wt Readings from Last 3 Encounters:  05/26/14 157 lb (71.215 kg)  03/13/13 151 lb (68.493 kg)  10/13/10 140 lb (63.504 kg)   BP Readings from Last 3 Encounters:  05/26/14 178/112  03/13/13 150/100  05/28/12 152/103        Objective:   Physical Exam  Constitutional: She appears well-developed. No distress.  HENT:  Head: Normocephalic.  Right Ear: External ear normal.  Left Ear: External ear normal.  Nose: Nose normal.  Mouth/Throat: Oropharynx is clear and moist.  Eyes: Conjunctivae are normal. Pupils are equal, round, and reactive to light. Right eye exhibits no discharge. Left eye exhibits no discharge.  Neck: Normal range of motion. Neck supple. No JVD present. No tracheal deviation present. No thyromegaly present.  Cardiovascular: Normal rate, regular rhythm and normal heart sounds.   Pulmonary/Chest: No stridor. No respiratory distress. She has no wheezes.  Abdominal: Soft. Bowel sounds are normal. She exhibits no distension and no mass. There is no tenderness. There is no rebound and no guarding.  Musculoskeletal: She exhibits no edema and no tenderness.   Lymphadenopathy:    She has no cervical adenopathy.  Neurological: She displays normal reflexes. No cranial nerve deficit. She exhibits normal muscle tone. Coordination normal.  Skin: No rash noted. No erythema.  Psychiatric: She has a normal mood and affect. Her behavior is normal. Judgment and thought content normal.    Lab Results  Component Value Date   WBC 4.4* 03/13/2013   HGB 13.7 03/13/2013   HCT 40.1 03/13/2013   PLT 219.0 03/13/2013   GLUCOSE 77 03/13/2013   CHOL 172 10/13/2010   TRIG 35.0 10/13/2010   HDL 103.30 10/13/2010   LDLCALC 62 10/13/2010   ALT 27 03/13/2013   AST 25 03/13/2013   NA 137 03/13/2013   K 4.1 03/13/2013   CL 104 03/13/2013   CREATININE 0.7 03/13/2013   BUN 15 03/13/2013   CO2 27 03/13/2013   TSH 1.50 03/13/2013   HGBA1C 6.1 10/13/2010          Assessment & Plan:

## 2014-05-26 NOTE — Telephone Encounter (Signed)
Relevant patient education assigned to patient using Emmi. ° °

## 2014-05-26 NOTE — Progress Notes (Signed)
Pre visit review using our clinic review tool, if applicable. No additional management support is needed unless otherwise documented below in the visit note. 

## 2014-05-26 NOTE — Assessment & Plan Note (Signed)
Re-start Rx NAS diet

## 2014-05-26 NOTE — Assessment & Plan Note (Signed)
Pt declined Ambien, SSRIs Will try Belsomra

## 2014-05-31 DIAGNOSIS — Z0279 Encounter for issue of other medical certificate: Secondary | ICD-10-CM

## 2014-07-14 ENCOUNTER — Telehealth: Payer: Self-pay | Admitting: *Deleted

## 2014-07-14 NOTE — Telephone Encounter (Signed)
Called pt back no answer & can't leave msg due to vm full...Sydney [name]

## 2014-07-14 NOTE — Telephone Encounter (Signed)
Pt return call back inform her Dr. Tamala Julian can see her concerning her knee pain. Made appt for 07/28/14...Johny Chess

## 2014-07-14 NOTE — Telephone Encounter (Signed)
Left msg on triage stating she is still having problem with her knees. Sometimes it feel like they are going out. Requesting referral to see orthopedic md.../lmb

## 2014-07-21 ENCOUNTER — Telehealth: Payer: Self-pay | Admitting: *Deleted

## 2014-07-21 MED ORDER — VITAMIN D 1000 UNITS PO TABS: 1000.0000 [IU] | ORAL_TABLET | Freq: Every day | ORAL | Status: DC

## 2014-07-21 MED ORDER — VITAMIN B-12 1000 MCG PO TABS: 1000.0000 ug | ORAL_TABLET | Freq: Every day | ORAL | Status: DC

## 2014-07-21 MED ORDER — VITAMIN A 10000 UNITS PO CAPS: 10000.0000 [IU] | ORAL_CAPSULE | Freq: Every day | ORAL | Status: DC

## 2014-07-21 MED ORDER — VITAMIN B-6 250 MG PO TABS: 250.0000 mg | ORAL_TABLET | Freq: Every day | ORAL | Status: DC

## 2014-07-21 NOTE — Telephone Encounter (Signed)
Pt stated she is wanting a rx for avalide like she was taking before. Another md change rx to losartain.she has never used. rquesting md to rx avalide at highest dose and she can cut in half like before...Johny Chess

## 2014-07-22 MED ORDER — IRBESARTAN-HYDROCHLOROTHIAZIDE 300-25 MG PO TABS: 1.0000 | ORAL_TABLET | Freq: Every day | ORAL | Status: DC

## 2014-07-22 MED ORDER — BIOTIN 1000 MCG PO TABS: 1000.0000 ug | ORAL_TABLET | Freq: Every day | ORAL | Status: DC

## 2014-07-22 MED ORDER — VITAMIN C & E COMBINATION 500-400 MG-UNIT PO CAPS: ORAL_CAPSULE | ORAL | Status: DC

## 2014-07-22 NOTE — Telephone Encounter (Signed)
Notified pt md change BP med back to avalide rx has been sent to [facility], along with otc meds so she can use her flex spend card...Johny Chess

## 2014-07-22 NOTE — Telephone Encounter (Signed)
Ok. Done 

## 2014-07-23 ENCOUNTER — Telehealth: Payer: Self-pay | Admitting: *Deleted

## 2014-07-23 ENCOUNTER — Telehealth: Payer: Self-pay | Admitting: Internal Medicine

## 2014-07-23 MED ORDER — IRBESARTAN-HYDROCHLOROTHIAZIDE 300-12.5 MG PO TABS: 1.0000 | ORAL_TABLET | Freq: Every day | ORAL | Status: DC

## 2014-07-23 NOTE — Telephone Encounter (Signed)
Patient states pharmacy did not receive script for avalide or vit b6, vit b12, vit a, vit ce, fish oil omega3, viotion, iron (pt does not know mg) or valerian.  Patient uses pharmacy at [facility] on [address]  She has to have scripts for over the counter in order to use her benefits card.

## 2014-07-23 NOTE — Telephone Encounter (Signed)
Pt called and stated pharmacy states they do not make the 300/25 mg anymore come in 300/12.5mg . Inform pt will contact pharmacy and updated script. Called [facility] spoke with Heather change the 300/25 mg to 300/12.54  Mg.../lmb

## 2014-07-23 NOTE — Telephone Encounter (Signed)
Called [facility] spoke with pharmacy they stated they received all prescriptions. Called pt back inform her all rx's was sent & received...Sydney [name]

## 2014-07-28 ENCOUNTER — Other Ambulatory Visit (INDEPENDENT_AMBULATORY_CARE_PROVIDER_SITE_OTHER): Payer: Federal, State, Local not specified - PPO

## 2014-07-28 ENCOUNTER — Encounter: Payer: Self-pay | Admitting: Family Medicine

## 2014-07-28 ENCOUNTER — Ambulatory Visit (INDEPENDENT_AMBULATORY_CARE_PROVIDER_SITE_OTHER): Payer: Federal, State, Local not specified - PPO | Admitting: Family Medicine

## 2014-07-28 VITALS — BP 142/90 | HR 63 | Ht 62.0 in | Wt 154.0 lb

## 2014-07-28 DIAGNOSIS — M25561 Pain in right knee: Secondary | ICD-10-CM

## 2014-07-28 DIAGNOSIS — M25569 Pain in unspecified knee: Secondary | ICD-10-CM

## 2014-07-28 DIAGNOSIS — S83206A Unspecified tear of unspecified meniscus, current injury, right knee, initial encounter: Secondary | ICD-10-CM | POA: Insufficient documentation

## 2014-07-28 DIAGNOSIS — IMO0002 Reserved for concepts with insufficient information to code with codable children: Secondary | ICD-10-CM

## 2014-07-28 MED ORDER — MELOXICAM 15 MG PO TABS: 15.0000 mg | ORAL_TABLET | Freq: Every day | ORAL | Status: DC

## 2014-07-28 NOTE — Progress Notes (Signed)
  [name] [place], [place] [address] Phone: [phone] Subjective:    I'm seeing this patient by the request  of:  Walker Kehr, MD   CC: Knee pain  CHY:IFOYDXAJOI Sydney [name] is a 48 y.o. female coming in with complaint of knee pain. Patient states she is having this pain on and off for approximately 1 year. Patient states over the course last month she notices when she was dancing she had significant amount of pain when she was doing a twisting motion. Patient states the next day she did have some swelling. Since then whenever she just some twisting she can have sharp pain that is 8/10 in severity. Patient states the pain is mostly on the medial as well as lateral aspect of the knee. States that she is starting to lose flexibility in this knee as well. Patient states that flexing the knee can be troublesome. Patient denies any nighttime awakening at night. Patient states that ibuprofen and Tylenol does help a little bit.    Past medical history, social, surgical and family history all reviewed in electronic medical record.   Review of Systems: No headache, visual changes, nausea, vomiting, diarrhea, constipation, dizziness, abdominal pain, skin rash, fevers, chills, night sweats, weight loss, swollen lymph nodes, body aches, joint swelling, muscle aches, chest pain, shortness of breath, mood changes.   Objective Blood pressure 142/90, pulse 63, height 5\' 2"  (1.575 m), weight 154 lb (69.854 kg), SpO2 95.00%.  General: No apparent distress alert and oriented x3 mood and affect normal, dressed appropriately.  HEENT: Pupils equal, extraocular movements intact  Respiratory: Patient's speak in full sentences and does not appear short of breath  Cardiovascular: No lower extremity edema, non tender, no erythema  Skin: Warm dry intact with no signs of infection or rash on extremities or on axial skeleton.  Abdomen: Soft nontender  Neuro: Cranial  nerves II through XII are intact, neurovascularly intact in all extremities with 2+ DTRs and 2+ pulses.  Lymph: No lymphadenopathy of posterior or anterior cervical chain or axillae bilaterally.  Gait normal with good balance and coordination.  MSK:  Non tender with full range of motion and good stability and symmetric strength and tone of shoulders, elbows, wrist, hip, and ankles bilaterally.  Knee: Right Normal to inspection with no erythema or effusion or obvious bony abnormalities. Palpation normal with no warmth, joint line tenderness, patellar tenderness, or condyle tenderness. ROM full in flexion and extension and lower leg rotation. Ligaments with solid consistent endpoints including ACL, PCL, LCL, MCL. Positive Mcmurray's, Apley's, and Thessalonian tests. Non painful patellar compression. Patellar glide without crepitus. Patellar and quadriceps tendons unremarkable. Hamstring and quadriceps strength is normal.   MSK US performed of: Right knee This study was ordered, performed, and interpreted by Charlann Boxer D.O.  Knee: All structures visualized. Anteromedial meniscus has what appears to be an acute on chronic tear with mild displacement. Hypoechoic changes noted Anterolateral, posteromedial, and posterolateral menisci unremarkable without tearing, fraying, effusion, or displacement. Patellar Tendon unremarkable on long and transverse views without effusion. No abnormality of prepatellar bursa. LCL and MCL unremarkable on long and transverse views. No abnormality of origin of medial or lateral head of the gastrocnemius.  IMPRESSION:  Acute on chronic meniscal tear    Impression and Recommendations:     This case required medical decision making of moderate complexity.

## 2014-07-28 NOTE — Patient Instructions (Addendum)
Very nice to meet you Ice 20 minutes 2 times daily. Usually after activity and before bed. Exercises 3 times a week.  Meloxicam daily for 10 days then as needed Avoid significant twisting or squatting if you can Wear brace during the day not at night Come back in 3-4 weeks.

## 2014-07-28 NOTE — Assessment & Plan Note (Signed)
Patient does have acute and chronic meniscal tear. I think patient's physical findings is consistent with this. We discussed different treatment options the patient chose to do more of a conservative approach. Patient has decided to do an icing protocol, bracing, as well as anti-inflammatories for short course of time.   Patient will do this as well as a home exercise and come back again in 3-4 weeks. Patient declined x-rays or injection today.

## 2014-07-30 ENCOUNTER — Other Ambulatory Visit: Payer: Self-pay | Admitting: *Deleted

## 2014-07-30 MED ORDER — IRBESARTAN-HYDROCHLOROTHIAZIDE 300-12.5 MG PO TABS: 1.0000 | ORAL_TABLET | Freq: Every day | ORAL | Status: DC

## 2014-07-30 MED ORDER — PYRIDOXINE HCL 200 MG PO TABS: 200.0000 mg | ORAL_TABLET | Freq: Every day | ORAL | Status: DC

## 2014-08-27 ENCOUNTER — Encounter: Payer: Self-pay | Admitting: Internal Medicine

## 2014-08-27 ENCOUNTER — Encounter: Payer: Self-pay | Admitting: Family Medicine

## 2014-08-27 ENCOUNTER — Ambulatory Visit (INDEPENDENT_AMBULATORY_CARE_PROVIDER_SITE_OTHER): Payer: Federal, State, Local not specified - PPO | Admitting: Family Medicine

## 2014-08-27 ENCOUNTER — Ambulatory Visit (INDEPENDENT_AMBULATORY_CARE_PROVIDER_SITE_OTHER): Payer: Federal, State, Local not specified - PPO | Admitting: Internal Medicine

## 2014-08-27 VITALS — BP 138/86 | HR 57 | Ht 62.0 in | Wt 159.0 lb

## 2014-08-27 VITALS — BP 138/86 | HR 57 | Wt 159.0 lb

## 2014-08-27 DIAGNOSIS — G4700 Insomnia, unspecified: Secondary | ICD-10-CM

## 2014-08-27 DIAGNOSIS — I10 Essential (primary) hypertension: Secondary | ICD-10-CM

## 2014-08-27 DIAGNOSIS — Z5189 Encounter for other specified aftercare: Secondary | ICD-10-CM

## 2014-08-27 DIAGNOSIS — S83206D Unspecified tear of unspecified meniscus, current injury, right knee, subsequent encounter: Secondary | ICD-10-CM

## 2014-08-27 MED ORDER — LOSARTAN POTASSIUM-HCTZ 100-25 MG PO TABS: 1.0000 | ORAL_TABLET | Freq: Every day | ORAL | Status: DC

## 2014-08-27 MED ORDER — AMLODIPINE BESYLATE 5 MG PO TABS: 5.0000 mg | ORAL_TABLET | Freq: Every day | ORAL | Status: DC

## 2014-08-27 MED ORDER — IRBESARTAN-HYDROCHLOROTHIAZIDE 150-12.5 MG PO TABS: 2.0000 | ORAL_TABLET | Freq: Every day | ORAL | Status: DC

## 2014-08-27 NOTE — Patient Instructions (Signed)
You are doing great!!! Ice after exercise would still be good Brace with exercise and dance for next month.  Ok to squat 75 degrees and increase 5 degrees a week.  THen ok to increase wight 10% a week Start at 50% of what you were doing.  Start a walk-run progression: - Initially start one minute walking than one minute running for 20 mins in the first week,   then 25 mins during the second week, then 30 mins afterwards.  Once you have reached 30 mins: - Run 2 mins, then walk 1 min. -Then run 3 mins, and walk 1 min. -Then run 4 mins, and walk 1 min. -Then run 5 mins, and walk 1 min. -Slowly build up weekly to running 30 mins nonstop.  If painful at any of the steps, back up one step.  See me in 4 weeks if not perfect

## 2014-08-27 NOTE — Assessment & Plan Note (Signed)
Better  

## 2014-08-27 NOTE — Progress Notes (Signed)
Pre visit review using our clinic review tool, if applicable. No additional management support is needed unless otherwise documented below in the visit note. 

## 2014-08-27 NOTE — Assessment & Plan Note (Signed)
Continue with current prescription therapy as reflected on the Med list. BP Readings from Last 3 Encounters:  08/27/14 138/86  08/27/14 138/86  07/28/14 142/90

## 2014-08-27 NOTE — Assessment & Plan Note (Signed)
Patient has improved significantly over the course last 6 weeks. Patient was given different strengthening exercises and we are going to give her a running progression to increase slowly. On this progression. We discussed icing protocol and continue in the brace for another month just to be safe. Patient is very active and does work 2 jobs to does not have a significant amount of time to do physical therapy. Patient will follow up again with me in 4 weeks for further evaluation and treatment.  Spent greater than 25 minutes with patient face-to-face and had greater than 50% of counseling including as described above in assessment and plan.

## 2014-08-27 NOTE — Progress Notes (Signed)
  [name] [address] [place], [place] [address] Phone: [phone] Subjective:    I'm seeing this patient by the request  of:  Walker Kehr, MD   CC: Knee pain  [id number] Sydney [name] is a 48 y.o. female coming in with complaint of knee pain. Patient was found to have an acute on chronic meniscal tear. Patient was given a brace, icing protocol, anti-inflammatories and we did decrease her activity. Patient states that she is feeling 90% better. Patient has continued to be its but has not been to the gym. Denies any radiation of pain, any numbness, for any pain at night. Patient is no longer doing the anti-inflammatories. Patient is happy with the results so far. Patient has been doing the exercises intermittently.    Past medical history, social, surgical and family history all reviewed in electronic medical record.   Review of Systems: No headache, visual changes, nausea, vomiting, diarrhea, constipation, dizziness, abdominal pain, skin rash, fevers, chills, night sweats, weight loss, swollen lymph nodes, body aches, joint swelling, muscle aches, chest pain, shortness of breath, mood changes.   Objective Blood pressure 138/86, pulse 57, height 5\' 2"  (1.575 m), weight 159 lb (72.122 kg), SpO2 99.00%.  General: No apparent distress alert and oriented x3 mood and affect normal, dressed appropriately.  HEENT: Pupils equal, extraocular movements intact  Respiratory: Patient's speak in full sentences and does not appear short of breath  Cardiovascular: No lower extremity edema, non tender, no erythema  Skin: Warm dry intact with no signs of infection or rash on extremities or on axial skeleton.  Abdomen: Soft nontender  Neuro: Cranial nerves II through XII are intact, neurovascularly intact in all extremities with 2+ DTRs and 2+ pulses.  Lymph: No lymphadenopathy of posterior or anterior cervical chain or axillae bilaterally.  Gait normal with good  balance and coordination.  MSK:  Non tender with full range of motion and good stability and symmetric strength and tone of shoulders, elbows, wrist, hip, and ankles bilaterally.  Knee: Right Normal to inspection with no erythema or effusion or obvious bony abnormalities. Palpation normal with no warmth, joint line tenderness, patellar tenderness, or condyle tenderness. ROM full in flexion and extension and lower leg rotation. Ligaments with solid consistent endpoints including ACL, PCL, LCL, MCL. Mildly positive Mcmurray's, Apley's, and Thessalonian tests. Non painful patellar compression. Patellar glide without crepitus. Patellar and quadriceps tendons unremarkable. Hamstring and quadriceps strength is normal.     Impression and Recommendations:     This case required medical decision making of moderate complexity.

## 2014-08-27 NOTE — Progress Notes (Signed)
   Subjective:    HPI   F/u insomnia - chronic; never took Ambien F/u HTN   Review of Systems  Constitutional: Negative for chills, activity change, appetite change, fatigue and unexpected weight change.  HENT: Negative for congestion, mouth sores and sinus pressure.   Eyes: Negative for visual disturbance.  Respiratory: Negative for cough, chest tightness and shortness of breath.   Gastrointestinal: Negative for nausea and abdominal pain.  Genitourinary: Negative for urgency, frequency, vaginal discharge, difficulty urinating and vaginal pain.  Musculoskeletal: Negative for back pain and gait problem.  Skin: Negative for pallor and rash.  Neurological: Negative for dizziness, tremors, weakness, numbness and headaches.  Psychiatric/Behavioral: Negative for confusion and sleep disturbance.    Wt Readings from Last 3 Encounters:  08/27/14 159 lb (72.122 kg)  08/27/14 159 lb (72.122 kg)  07/28/14 154 lb (69.854 kg)   BP Readings from Last 3 Encounters:  08/27/14 138/86  08/27/14 138/86  07/28/14 142/90        Objective:   Physical Exam  Constitutional: She appears well-developed. No distress.  HENT:  Head: Normocephalic.  Right Ear: External ear normal.  Left Ear: External ear normal.  Nose: Nose normal.  Mouth/Throat: Oropharynx is clear and moist.  Eyes: Conjunctivae are normal. Pupils are equal, round, and reactive to light. Right eye exhibits no discharge. Left eye exhibits no discharge.  Neck: Normal range of motion. Neck supple. No JVD present. No tracheal deviation present. No thyromegaly present.  Cardiovascular: Normal rate, regular rhythm and normal heart sounds.   Pulmonary/Chest: No stridor. No respiratory distress. She has no wheezes.  Abdominal: Soft. Bowel sounds are normal. She exhibits no distension and no mass. There is no tenderness. There is no rebound and no guarding.  Musculoskeletal: She exhibits no edema and no tenderness.  Lymphadenopathy:   She has no cervical adenopathy.  Neurological: She displays normal reflexes. No cranial nerve deficit. She exhibits normal muscle tone. Coordination normal.  Skin: No rash noted. No erythema.  Psychiatric: She has a normal mood and affect. Her behavior is normal. Judgment and thought content normal.    Lab Results  Component Value Date   WBC 4.4* 03/13/2013   HGB 13.7 03/13/2013   HCT 40.1 03/13/2013   PLT 219.0 03/13/2013   GLUCOSE 77 03/13/2013   CHOL 172 10/13/2010   TRIG 35.0 10/13/2010   HDL 103.30 10/13/2010   LDLCALC 62 10/13/2010   ALT 27 03/13/2013   AST 25 03/13/2013   NA 137 03/13/2013   K 4.1 03/13/2013   CL 104 03/13/2013   CREATININE 0.7 03/13/2013   BUN 15 03/13/2013   CO2 27 03/13/2013   TSH 1.50 03/13/2013   HGBA1C 6.1 10/13/2010          Assessment & Plan:

## 2014-09-29 ENCOUNTER — Encounter: Payer: Self-pay | Admitting: Family Medicine

## 2014-09-29 ENCOUNTER — Ambulatory Visit (INDEPENDENT_AMBULATORY_CARE_PROVIDER_SITE_OTHER): Payer: Federal, State, Local not specified - PPO | Admitting: Family Medicine

## 2014-09-29 VITALS — BP 112/80 | HR 67 | Ht 62.0 in | Wt 153.0 lb

## 2014-09-29 DIAGNOSIS — R252 Cramp and spasm: Secondary | ICD-10-CM | POA: Insufficient documentation

## 2014-09-29 DIAGNOSIS — S83206D Unspecified tear of unspecified meniscus, current injury, right knee, subsequent encounter: Secondary | ICD-10-CM

## 2014-09-29 MED ORDER — FERROUS SULFATE 325 (65 FE) MG PO TBEC: 325.0000 mg | DELAYED_RELEASE_TABLET | Freq: Every day | ORAL | Status: DC

## 2014-09-29 NOTE — Patient Instructions (Addendum)
Good to see you Iron 325mg  daily.  New exercises 3 times a week alternating hip and knee.  Ice 20 minutes daily at the end of activity.  Continue the brace as needed If spasm occur again come back and see me.

## 2014-09-29 NOTE — Assessment & Plan Note (Signed)
Patient's exam is completely unremarkable today. Patient is able to actually do any activity that she feels she can tolerate. Continue the brace as needed. Continue icing after activity. Lungs patient does not have any recurrent pain she is more than willing to follow-up with me on an as-needed basis.

## 2014-09-29 NOTE — Assessment & Plan Note (Addendum)
Patient has had 2 nights of leg cramping at night that is waking her up. This is out of the ordinary for her. Seems to radiate sometimes up her legs but does not have any weakness and no significant duration of time either. Patient does have a past medical history significant for iron deficiency and has not been taking her pills for the last 2 weeks. Patient told to start supplementation again in patient did have a new prescription sent in. We discussed possible side effects. Patient will try this and if unfortunately the cramping does not go away in 2 weeks she will come back for further evaluation and treatment. No signs of anything such as a deep venous thrombosis. No injury that could've caused it.

## 2014-09-29 NOTE — Progress Notes (Signed)
  [name] [address] [place], [place] [address] Phone: [phone] Subjective:    I'm seeing this patient by the request  of:  Walker Kehr, MD   CC: Knee pain  [id number] Sydney [name] is a 48 y.o. female coming in with complaint of knee pain. Patient was found to have an acute on chronic meniscal tear. Patient was given a brace, icing protocol, anti-inflammatories and we did decrease her activity. Patient at last follow-up was doing significantly better one month ago. Patient was encouraged to increase her activity as tolerated but continue the bracing, icing, and topical anti-inflammatory. Patient states he continues to do significantly well. Patient states that she does have some cramping on the lateral aspect. Patient states that it has happened the last 2 nights. Patient denies any weakness. Patient states during the day she seems to be doing well. Patient has started increasing her activity without any significant pain. No clicking, popping or giving out on her.    Past medical history, social, surgical and family history all reviewed in electronic medical record.   Review of Systems: No headache, visual changes, nausea, vomiting, diarrhea, constipation, dizziness, abdominal pain, skin rash, fevers, chills, night sweats, weight loss, swollen lymph nodes, body aches, joint swelling, muscle aches, chest pain, shortness of breath, mood changes.   Objective Blood pressure 112/80, pulse 67, height 5\' 2"  (1.575 m), weight 153 lb (69.4 kg), SpO2 97.00%.  General: No apparent distress alert and oriented x3 mood and affect normal, dressed appropriately.  HEENT: Pupils equal, extraocular movements intact  Respiratory: Patient's speak in full sentences and does not appear short of breath  Cardiovascular: No lower extremity edema, non tender, no erythema  Skin: Warm dry intact with no signs of infection or rash on extremities or on axial skeleton.    Abdomen: Soft nontender  Neuro: Cranial nerves II through XII are intact, neurovascularly intact in all extremities with 2+ DTRs and 2+ pulses.  Lymph: No lymphadenopathy of posterior or anterior cervical chain or axillae bilaterally.  Gait normal with good balance and coordination.  MSK:  Non tender with full range of motion and good stability and symmetric strength and tone of shoulders, elbows, wrist, hip, and ankles bilaterally.  Knee: Right Normal to inspection with no erythema or effusion or obvious bony abnormalities. Palpation normal with no warmth, joint line tenderness, patellar tenderness, or condyle tenderness. ROM full in flexion and extension and lower leg rotation. Ligaments with solid consistent endpoints including ACL, PCL, LCL, MCL. Negative Mcmurray's, Apley's, and Thessalonian tests. Non painful patellar compression. Patellar glide without crepitus. Patellar and quadriceps tendons unremarkable. Hamstring and quadriceps strength is normal.  Completely normal exam   Impression and Recommendations:     This case required medical decision making of moderate complexity.

## 2014-10-13 ENCOUNTER — Ambulatory Visit: Payer: Federal, State, Local not specified - PPO | Admitting: Family Medicine

## 2014-12-01 LAB — HM MAMMOGRAPHY

## 2015-01-21 ENCOUNTER — Ambulatory Visit (INDEPENDENT_AMBULATORY_CARE_PROVIDER_SITE_OTHER): Payer: Federal, State, Local not specified - PPO | Admitting: Internal Medicine

## 2015-01-21 ENCOUNTER — Encounter: Payer: Self-pay | Admitting: Internal Medicine

## 2015-01-21 VITALS — BP 104/86 | HR 78 | Temp 98.6°F | Resp 15 | Ht 62.0 in | Wt 150.8 lb

## 2015-01-21 DIAGNOSIS — L2089 Other atopic dermatitis: Secondary | ICD-10-CM

## 2015-01-21 MED ORDER — PREDNISONE 20 MG PO TABS: 20.0000 mg | ORAL_TABLET | Freq: Two times a day (BID) | ORAL | Status: DC

## 2015-01-21 NOTE — Progress Notes (Signed)
Pre visit review using our clinic review tool, if applicable. No additional management support is needed unless otherwise documented below in the visit note. 

## 2015-01-21 NOTE — Patient Instructions (Signed)
Avoid perfumes and cosmetics which are not hypoallergenic. Restrict hyperallergenic foods at this time: Nuts, strawberries, seafood , chocolate, and tomatoes. Apply Cort Aid OTC twice a day to the involved tissues. Do not get this topical steroid into eyes. Use hypoallergenic cleansing motions.

## 2015-01-21 NOTE — Progress Notes (Signed)
   Subjective:    Patient ID: Sydney [name], female    DOB: 1965/12/11, 49 y.o.   MRN: [id number]  HPI The rash began 01/09/15 as papules between the breasts. She noticed it when she removed her sports bra. It was asymptomatic but subsequently she's developed some pruritis.  The evening this occurred she ate lobster bisque; she has a history of facial urticaria with with shellfish ingestion   It does appear to be spreading into the inframammary areas bilaterally. She's treated this with topical Vaseline.  There were no new exposures to cosmetics, detergents, or other topical agents.   Review of Systems  No associated itchy, watery eyes.  Swelling of the lips or tongue or intraoral lesions denied.  Shortness of breath, wheezing, or cough absent.  No vesicles, pustules or urticaria noted.  Fever ,chills , or sweats denied.   Diarrhea not present.  No dysuria, pyuria or hematuria.     Objective:   Physical Exam  Pertinent or positive findings include: She has an upper partial. She has small papules between the breasts and under the breasts bilaterally. There is a larger lesion over the epigastric area. There are no vesicles or pustules present. These lesions blanch with direct pressure.  General appearance :adequately nourished; in no distress. Eyes: No conjunctival inflammation or scleral icterus is present. Oral exam:  Lips and gums are healthy appearing.There is no oropharyngeal erythema or exudate noted.  Heart:  Normal rate and regular rhythm. S1 and S2 normal without gallop, murmur, click, rub or other extra sounds   Lungs:Chest clear to auscultation; no wheezes, rhonchi,rales ,or rubs present.No increased work of breathing.  Abdomen: bowel sounds normal, soft and non-tender without masses, organomegaly or hernias noted.  No guarding or rebound. Vascular : all pulses equal ; no bruits present. Skin:Warm & dry ; no  tenting Lymphatic: No lymphadenopathy is noted about  the head, neck, axilla Neuro: Strength, tone  normal.          Assessment & Plan:  #1 papular dermatitis, pruritic  Plan: See orders and recommendations

## 2015-02-01 ENCOUNTER — Other Ambulatory Visit: Payer: Self-pay | Admitting: Internal Medicine

## 2015-02-01 ENCOUNTER — Telehealth: Payer: Self-pay | Admitting: Internal Medicine

## 2015-02-01 DIAGNOSIS — L2089 Other atopic dermatitis: Secondary | ICD-10-CM

## 2015-02-01 MED ORDER — MOMETASONE FUROATE 0.1 % EX OINT: TOPICAL_OINTMENT | CUTANEOUS | Status: DC

## 2015-02-01 NOTE — Telephone Encounter (Signed)
Pt called in and said that rash has come back and wants to know if she needs to come back in or can there be more meds sent in for it?

## 2015-02-01 NOTE — Telephone Encounter (Signed)
Apply Cort Aid OTC twice a day to the involved tissues. This is safest if rash is on chest again.Do not get this topical steroid into eyes. Use hypoallergenic cleansing motions. Dermatology referral made

## 2015-02-01 NOTE — Telephone Encounter (Addendum)
Phone call patient has been advised. She states even though it's OTC she's requesting a script be sent to [facility] on [address]. Something to do with insurance. Different options come up. I do not know which to pick. Is it maximum strength? Advanced 12 hour? Please advise.

## 2015-02-01 NOTE — Telephone Encounter (Signed)
Pt called to check up on this request, request to send another massage about this

## 2015-02-02 ENCOUNTER — Telehealth: Payer: Self-pay | Admitting: Internal Medicine

## 2015-02-02 MED ORDER — HYDROCORTISONE 1 % EX LOTN: 1.0000 "application " | TOPICAL_LOTION | Freq: Every day | CUTANEOUS | Status: DC

## 2015-02-02 NOTE — Telephone Encounter (Signed)
Pt called stated that medication that we sent in for her has to say Cortaid in order for insurance will pay for. Please check because we call in something else yesterday.

## 2015-02-02 NOTE — Telephone Encounter (Signed)
changed

## 2015-02-14 ENCOUNTER — Telehealth: Payer: Self-pay | Admitting: Internal Medicine

## 2015-02-14 NOTE — Telephone Encounter (Signed)
Hyzaar is correct Thx

## 2015-02-14 NOTE — Telephone Encounter (Signed)
Would like a call back in regards to Dermatologist referral.

## 2015-02-14 NOTE — Telephone Encounter (Signed)
Patient has been taking losartan.  She ran out and went to fill.  They pharmacy also had a script for irbesartan hydrochlorothiazide.  Pharmacy instructed patient to ask which med is she suppose to be taking.

## 2015-02-14 NOTE — Telephone Encounter (Signed)
Chart shows Hyzaar. Please advise.

## 2015-02-15 NOTE — Telephone Encounter (Signed)
Pt informed

## 2015-02-16 ENCOUNTER — Telehealth: Payer: Self-pay | Admitting: Internal Medicine

## 2015-02-16 NOTE — Telephone Encounter (Signed)
Patient stated that the dermatologist office haven't call to schedule an appt. Please advise

## 2015-02-21 ENCOUNTER — Telehealth: Payer: Self-pay | Admitting: Internal Medicine

## 2015-02-21 ENCOUNTER — Ambulatory Visit: Payer: Federal, State, Local not specified - PPO | Admitting: Internal Medicine

## 2015-02-21 DIAGNOSIS — G479 Sleep disorder, unspecified: Secondary | ICD-10-CM

## 2015-02-21 NOTE — Telephone Encounter (Signed)
Patient came in late for appointment today and could not be seen.  She states sleep aids are not helping her.  She is requesting to be referred to a sleep specialist.

## 2015-02-21 NOTE — Telephone Encounter (Signed)
Ok Thx 

## 2015-02-22 NOTE — Telephone Encounter (Signed)
Notified pt referral has been place...lmb 

## 2015-02-22 NOTE — Telephone Encounter (Signed)
Pt scheduled. See referral.

## 2015-03-01 ENCOUNTER — Ambulatory Visit: Payer: Federal, State, Local not specified - PPO | Admitting: Internal Medicine

## 2015-03-28 ENCOUNTER — Encounter: Payer: Self-pay | Admitting: Family Medicine

## 2015-03-28 ENCOUNTER — Ambulatory Visit (INDEPENDENT_AMBULATORY_CARE_PROVIDER_SITE_OTHER): Payer: Federal, State, Local not specified - PPO | Admitting: Family Medicine

## 2015-03-28 VITALS — BP 106/72 | HR 65 | Ht 62.0 in | Wt 154.0 lb

## 2015-03-28 DIAGNOSIS — M999 Biomechanical lesion, unspecified: Secondary | ICD-10-CM | POA: Insufficient documentation

## 2015-03-28 DIAGNOSIS — M533 Sacrococcygeal disorders, not elsewhere classified: Secondary | ICD-10-CM | POA: Insufficient documentation

## 2015-03-28 DIAGNOSIS — M9904 Segmental and somatic dysfunction of sacral region: Secondary | ICD-10-CM

## 2015-03-28 DIAGNOSIS — M7542 Impingement syndrome of left shoulder: Secondary | ICD-10-CM

## 2015-03-28 MED ORDER — DICLOFENAC SODIUM 2 % TD SOLN: TRANSDERMAL | Status: DC

## 2015-03-28 NOTE — Progress Notes (Signed)
[name] [address] [place], [place] [address] Phone: [phone] Subjective:    I'm seeing this patient by the request  of:  Walker Kehr, MD   CC: hip pain and left shoulder pain  [id number] Sydney [name] is a 49 y.o. female coming in with complaint of hip pain and left shoulder pain. Patient's hip pain patient states it is mostly on the outside aspect of her right hip. Seems to happen more when she is having to stand long amount of time. States it seems to be more relocated down the posterior aspect of her hip. Patient denies any significant radiation down the legs but sometimes it seems to happen and can be associated with some of her leg cramping. Patient states that she does not stand for long amount of time and seems to do better. Denies any significant injury that she can things specifically for this problem. Rates the severity 3 out of 10  Patient's left shoulder pain.  He considers it hurts more when she is doing repetitive activity. Patient states that it is more dispositional when she reaches behind her back which is significantly above her head. Patient denies any radiation down the arm or any numbness or tingling. Patient states that it is more of a dull aching sensation and rates it 3 out of 10. Trying time awakening and does not stop her from daily activities.    Past medical history, social, surgical and family history all reviewed in electronic medical record.   Review of Systems: No headache, visual changes, nausea, vomiting, diarrhea, constipation, dizziness, abdominal pain, skin rash, fevers, chills, night sweats, weight loss, swollen lymph nodes, body aches, joint swelling, muscle aches, chest pain, shortness of breath, mood changes.   Objective Blood pressure 106/72, pulse 65, height 5\' 2"  (1.575 m), weight 154 lb (69.854 kg), SpO2 98 %.  General: No apparent distress alert and oriented x3 mood and affect normal, dressed  appropriately.  HEENT: Pupils equal, extraocular movements intact  Respiratory: Patient's speak in full sentences and does not appear short of breath  Cardiovascular: No lower extremity edema, non tender, no erythema  Skin: Warm dry intact with no signs of infection or rash on extremities or on axial skeleton.  Abdomen: Soft nontender  Neuro: Cranial nerves II through XII are intact, neurovascularly intact in all extremities with 2+ DTRs and 2+ pulses.  Lymph: No lymphadenopathy of posterior or anterior cervical chain or axillae bilaterally.  Gait normal with good balance and coordination.  MSK:  Non tender with full range of motion and good stability and symmetric strength and tone of shoulders, elbows, wrist, hip, and ankles bilaterally.  Back Exam:  Inspection: Unremarkable  Motion: Flexion 45 deg, Extension 45 deg, Side Bending to 45 deg bilaterally,  Rotation to 45 deg bilaterally  SLR laying: Negative  XSLR laying: Negative  Palpable tenderness: Tenderness over sacroiliac joint FABER: Positive right Sensory change: Gross sensation intact to all lumbar and sacral dermatomes.  Reflexes: 2+ at both patellar tendons, 2+ at achilles tendons, Babinski's downgoing.  Strength at foot  Plantar-flexion: 5/5 Dorsi-flexion: 5/5 Eversion: 5/5 Inversion: 5/5  Leg strength  Quad: 5/5 Hamstring: 5/5 Hip flexor: 5/5 Hip abductors: 4/5 but symmetric Gait unremarkable.  Shoulder: Left Inspection reveals no abnormalities, atrophy or asymmetry. Palpation is normal with no tenderness over AC joint or bicipital groove. ROM is full in all planes. Rotator cuff strength normal throughout. Mild impingement signs Speeds and Yergason's tests normal. No labral pathology  noted with negative Obrien's, negative clunk and good stability. Normal scapular function observed. No painful arc and no drop arm sign. No apprehension sign    Osteopathic findings Sacrum left on left  Procedure note 40981; 15  minutes spent for Therapeutic exercises as stated in above notes.  This included exercises focusing on stretching, strengthening, with significant focus on eccentric aspects. Shoulder Exercises that included:  Basic scapular stabilization to include adduction and depression of scapula Scaption, focusing on proper movement and good control Internal and External rotation utilizing a theraband, with elbow tucked at side entire time Rows with theraband  Proper technique shown and discussed handout in great detail with ATC.  All questions were discussed and answered.     Impression and Recommendations:     This case required medical decision making of moderate complexity.

## 2015-03-28 NOTE — Assessment & Plan Note (Signed)
Patient does have more of a sacroiliac joint dysfunction. Patient did go over home exercises with athletic trainer today. We discussed core strengthening exercises and how this can be beneficial. Discussed icing regimen and home exercises. Patient is to make these changes and come back and see me again in 3 weeks for further evaluation. Patient did respond well to osteopathic manipulation today.

## 2015-03-28 NOTE — Progress Notes (Signed)
Pre visit review using our clinic review tool, if applicable. No additional management support is needed unless otherwise documented below in the visit note. 

## 2015-03-28 NOTE — Assessment & Plan Note (Signed)
Patient went home exercises and did go over them with athletic trainer. We discussed icing and patient given a trial topical anti-inflammatories. Patient does have oral anti-inflammatories when needed. Patient will come back and see me again in 3 weeks. If continuing to have discomfort consider injection.

## 2015-03-28 NOTE — Assessment & Plan Note (Signed)
Decision today to treat with OMT was based on Physical Exam  After verbal consent patient was treated with HVLA, ME techniques in sacral areas  Patient tolerated the procedure well with improvement in symptoms  Patient given exercises, stretches and lifestyle modifications  See medications in patient instructions if given  Patient will follow up in 3 weeks

## 2015-03-28 NOTE — Patient Instructions (Signed)
Good to see you Ice is your friend Continue the exercises we discussed and new ones for your shoulder.  Sacroiliac Joint Mobilization and Rehab 1. Work on pretzel stretching, shoulder back and leg draped in front. 3-5 sets, 30 sec.. 2. hip abductor rotations. standing, hip flexion and rotation outward then inward. 3 sets, 15 reps. when can do comfortably, add ankle weights starting at 2 pounds.  3. cross over stretching - shoulder back to ground, same side leg crossover. 3-5 sets for 30 min..  4. rolling up and back knees to chest and rocking. 5. sacral tilt - 5 sets, hold for 5-10 seconds Exercises on wall.  Heel and butt touching.  Raise leg 6 inches and hold 2 seconds.  Down slow for count of 4 seconds.  1 set of 30 reps daily on both sides.  Try pennsaid to shoulder and back up to 2 times a day Good shoes with rigid bottom.  Sydney [name], [place] or New balance greater then 700 See me again in 3 weeks.

## 2015-04-13 ENCOUNTER — Ambulatory Visit (INDEPENDENT_AMBULATORY_CARE_PROVIDER_SITE_OTHER): Payer: Federal, State, Local not specified - PPO | Admitting: Pulmonary Disease

## 2015-04-13 ENCOUNTER — Encounter: Payer: Self-pay | Admitting: Pulmonary Disease

## 2015-04-13 VITALS — BP 110/70 | HR 53 | Ht 62.0 in | Wt 155.2 lb

## 2015-04-13 DIAGNOSIS — G4700 Insomnia, unspecified: Secondary | ICD-10-CM

## 2015-04-13 NOTE — Patient Instructions (Signed)
You may have insomnia or body clock Issue (delayed sleep phase) Rules of sleep hygiene were discussed  - light exercise x 30 mins daily -avoid caffeinated beverages after 4 pm - no more than 20 mins staying awake in bed, if not asleep, get out of bed & reading or light music - No TV or computer games at bedtime.  Keep sleep diary OK to take melatonin 5-10 mg 2 h prior to bedtime

## 2015-04-13 NOTE — Assessment & Plan Note (Addendum)
You may have insomnia or body clock Issue (delayed sleep phase) Rules of sleep hygiene were discussed  - light exercise x 30 mins daily -avoid caffeinated beverages after 4 pm - no more than 20 mins staying awake in bed, if not asleep, get out of bed & reading or light music - No TV or computer games at bedtime.  Keep sleep diary OK to take melatonin 5-10 mg 2 h prior to bedtime She was also given the contact for a cognitive behavioral therapist to help with sleep consolidation

## 2015-04-13 NOTE — Progress Notes (Signed)
Subjective:    Patient ID: Sydney [name], female    DOB: [birth year], 49 y.o.   MRN: [id number]  HPI   Chief Complaint  Patient presents with  . SLEEP CONSULT    Referred by Dr. Alain Marion; Trouble falling asleep and staying sleep; does not snore; does not stop breathing during sleep.  Epworth Score: 61   49 year old never smoker presents for evaluation of insomnia. 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 She reports trouble falling asleep and also has difficulty maintaining sleep. She has always been a night owl since her teenage years, bedtime is as late as 1 AM, earliest at midnight. In spite of taking melatonin at bedtime, and takes her about an hour to 2 hours to fall asleep, she sleeps on her side with one pillow. She has nocturnal awakening, she has a long sleep latency again is unable to falls back asleep. She has to get out of bed by 6 AM to 6:45 AM with difficulty for her job. On Friday mornings, she is able to stay in bed later up to 10:00. Her weight has remained steady. She drinks 16 ounces of caffeine in the morning. There is no history suggestive of cataplexy, sleep paralysis or parasomnias She denies creepy crawly sensations in the legs or leg discomfort or cramps after bedtime  She was given prescription for Ambien and other sleep aids but would like to avoid this if necessary  Past Medical History  Diagnosis Date  . Hypertension     No past surgical history on file.  Allergies  Allergen Reactions  . Shellfish Allergy Hives    Facial urticaria  . Enalapril Maleate     REACTION: cough   History   Social History  . Marital Status: Single    Spouse Name: N/A  . Number of Children: N/A  . Years of Education: N/A   Occupational History  . Not on file.   Social History Main Topics  . Smoking status: Never Smoker   . Smokeless tobacco: Not on file  . Alcohol Use: No    . Drug Use: No  . Sexual Activity: Not on file   Other Topics Concern  . Not on file   Social History Narrative    No family history on file.   Review of Systems  Constitutional: Negative for fever, chills and unexpected weight change.  HENT: Negative for congestion, dental problem, ear pain, nosebleeds, postnasal drip, rhinorrhea, sinus pressure, sneezing, sore throat, trouble swallowing and voice change.   Eyes: Negative for visual disturbance.  Respiratory: Negative for cough, choking and shortness of breath.   Cardiovascular: Negative for chest pain and leg swelling.  Gastrointestinal: Negative for vomiting, abdominal pain and diarrhea.  Genitourinary: Negative for difficulty urinating.  Musculoskeletal: Negative for arthralgias.  Skin: Negative for rash.  Neurological: Negative for tremors, syncope and headaches.  Hematological: Does not bruise/bleed easily.       Objective:   Physical Exam  Gen. Pleasant, well-nourished, in no distress, normal affect ENT - no lesions, no post nasal drip Neck: No JVD, no thyromegaly, no carotid bruits Lungs: no use of accessory muscles, no dullness to percussion, clear without rales or rhonchi  Cardiovascular: Rhythm regular, heart sounds  normal, no murmurs or gallops, no peripheral edema Abdomen: soft and non-tender, no hepatosplenomegaly, BS normal. Musculoskeletal: No deformities, no cyanosis or clubbing Neuro:  alert, non focal  Assessment & Plan:

## 2015-04-18 ENCOUNTER — Encounter: Payer: Self-pay | Admitting: Family Medicine

## 2015-04-18 ENCOUNTER — Ambulatory Visit (INDEPENDENT_AMBULATORY_CARE_PROVIDER_SITE_OTHER): Payer: Federal, State, Local not specified - PPO | Admitting: Family Medicine

## 2015-04-18 VITALS — BP 118/82 | HR 61 | Ht 62.0 in | Wt 153.0 lb

## 2015-04-18 DIAGNOSIS — M9904 Segmental and somatic dysfunction of sacral region: Secondary | ICD-10-CM

## 2015-04-18 DIAGNOSIS — M999 Biomechanical lesion, unspecified: Secondary | ICD-10-CM

## 2015-04-18 DIAGNOSIS — M533 Sacrococcygeal disorders, not elsewhere classified: Secondary | ICD-10-CM

## 2015-04-18 DIAGNOSIS — M7542 Impingement syndrome of left shoulder: Secondary | ICD-10-CM | POA: Diagnosis not present

## 2015-04-18 NOTE — Progress Notes (Signed)
Pre visit review using our clinic review tool, if applicable. No additional management support is needed unless otherwise documented below in the visit note. 

## 2015-04-18 NOTE — Assessment & Plan Note (Signed)
Decision today to treat with OMT was based on Physical Exam  After verbal consent patient was treated with HVLA, ME techniques in sacral areas  Patient tolerated the procedure well with improvement in symptoms  Patient given exercises, stretches and lifestyle modifications  See medications in patient instructions if given  Patient will follow up in 4 weeks          

## 2015-04-18 NOTE — Assessment & Plan Note (Signed)
Continues to improve, no change

## 2015-04-18 NOTE — Patient Instructions (Signed)
Good to see you .Ice 20 minutes 2 times daily. Usually after activity and before bed. Call me if shoulder gets worse Continue the exercise and try to focus on back and core strength a little more.  Go shopping for shoes.  [employer] See me again in 4 weeks.

## 2015-04-18 NOTE — Progress Notes (Signed)
[name] [address] [place], [place] [address] Phone: [phone] Subjective:    CC: hip pain and left shoulder pain follow-up  [id number] Sydney [name] is a 49 y.o. female coming in with complaint of hip pain and left shoulder pain. Patient's hip pain patient states it is mostly on the outside aspect of her right hip. Seems to happen more when she is having to stand long amount of time. States it seems to be more relocated down the posterior aspect of her hip. Patient has been diagnosed with more of a sacroiliac joint dysfunction. Patient was given home exercises and states that she's made some mild improvement. Patient continues to have difficult he when she stands for long amount of time. Patient though has been working out more regularly and is feeling overall somewhat better.  Patient's left shoulder pain.  Patient states that she continues to improve. Very mild discomfort more when reaching away from her body. States that there is no weakness. Nothing that his stopping her from her daily activities.    Past medical history, social, surgical and family history all reviewed in electronic medical record.   Review of Systems: No headache, visual changes, nausea, vomiting, diarrhea, constipation, dizziness, abdominal pain, skin rash, fevers, chills, night sweats, weight loss, swollen lymph nodes, body aches, joint swelling, muscle aches, chest pain, shortness of breath, mood changes.   Objective Blood pressure 118/82, pulse 61, height 5\' 2"  (1.575 m), weight 153 lb (69.4 kg), SpO2 97 %.  General: No apparent distress alert and oriented x3 mood and affect normal, dressed appropriately.  HEENT: Pupils equal, extraocular movements intact  Respiratory: Patient's speak in full sentences and does not appear short of breath  Cardiovascular: No lower extremity edema, non tender, no erythema  Skin: Warm dry intact with no signs of infection or rash on  extremities or on axial skeleton.  Abdomen: Soft nontender  Neuro: Cranial nerves II through XII are intact, neurovascularly intact in all extremities with 2+ DTRs and 2+ pulses.  Lymph: No lymphadenopathy of posterior or anterior cervical chain or axillae bilaterally.  Gait normal with good balance and coordination.  MSK:  Non tender with full range of motion and good stability and symmetric strength and tone of shoulders, elbows, wrist, hip, and ankles bilaterally.  Back Exam:  Inspection: Unremarkable  Motion: Flexion 45 deg, Extension 45 deg, Side Bending to 45 deg bilaterally,  Rotation to 45 deg bilaterally  SLR laying: Negative  XSLR laying: Negative  Palpable tenderness: Tenderness over sacroiliac joint still present FABER: Positive right but moderately improved Sensory change: Gross sensation intact to all lumbar and sacral dermatomes.  Reflexes: 2+ at both patellar tendons, 2+ at achilles tendons, Babinski's downgoing.  Strength at foot  Plantar-flexion: 5/5 Dorsi-flexion: 5/5 Eversion: 5/5 Inversion: 5/5  Leg strength  Quad: 5/5 Hamstring: 5/5 Hip flexor: 5/5 Hip abductors: 4/5 but symmetric Gait unremarkable. No change from previous exam  Shoulder: Left Inspection reveals no abnormalities, atrophy or asymmetry. Palpation is normal with no tenderness over AC joint or bicipital groove. ROM is full in all planes. Rotator cuff strength normal throughout. Mild impingement signsStill present but only with subscapularis  Speeds and Yergason's tests normal. No labral pathology noted with negative Obrien's, negative clunk and good stability. Normal scapular function observed. No painful arc and no drop arm sign. No apprehension sign    Osteopathic findings Sacrum left on left      Impression and Recommendations:  This case required medical decision making of moderate complexity.

## 2015-04-18 NOTE — Assessment & Plan Note (Signed)
Patient overall is doing well no radiation respond to OMT HEP given for phase 2 and core strength.  Discussed icing and shoes RTC in 4 weeks.

## 2015-05-11 ENCOUNTER — Telehealth: Payer: Self-pay | Admitting: Internal Medicine

## 2015-05-16 ENCOUNTER — Encounter: Payer: Self-pay | Admitting: Family Medicine

## 2015-05-16 ENCOUNTER — Encounter: Payer: Self-pay | Admitting: *Deleted

## 2015-05-16 ENCOUNTER — Ambulatory Visit (INDEPENDENT_AMBULATORY_CARE_PROVIDER_SITE_OTHER): Payer: Federal, State, Local not specified - PPO | Admitting: Family Medicine

## 2015-05-16 VITALS — BP 118/82 | HR 57 | Ht 62.0 in | Wt 157.0 lb

## 2015-05-16 DIAGNOSIS — M9903 Segmental and somatic dysfunction of lumbar region: Secondary | ICD-10-CM | POA: Diagnosis not present

## 2015-05-16 DIAGNOSIS — M9904 Segmental and somatic dysfunction of sacral region: Secondary | ICD-10-CM

## 2015-05-16 DIAGNOSIS — M999 Biomechanical lesion, unspecified: Secondary | ICD-10-CM | POA: Insufficient documentation

## 2015-05-16 DIAGNOSIS — M9902 Segmental and somatic dysfunction of thoracic region: Secondary | ICD-10-CM | POA: Diagnosis not present

## 2015-05-16 DIAGNOSIS — M533 Sacrococcygeal disorders, not elsewhere classified: Secondary | ICD-10-CM | POA: Diagnosis not present

## 2015-05-16 NOTE — Patient Instructions (Addendum)
Good to see you Continue to work on posture Stand on wall for total of 5 minutes a day if you can.  Keep watching your sleep position.  Ice is your friend Ergonomics Hip abductors  Exercises on wall.  Heel and butt touching.  Raise leg 6 inches and hold 2 seconds.  Down slow for count of 4 seconds.  1 set of 30 reps daily on both sides.   See me again in 4-6 weeks.

## 2015-05-16 NOTE — Assessment & Plan Note (Addendum)
Decision today to treat with OMT was based on Physical Exam  After verbal consent patient was treated with HVLA, ME techniques in the right side, lumbar and sacral areas  Patient tolerated the procedure well with improvement in symptoms  Patient given exercises, stretches and lifestyle modifications  See medications in patient instructions if given  Patient will follow up in 3-4 weeks

## 2015-05-16 NOTE — Progress Notes (Signed)
Pre visit review using our clinic review tool, if applicable. No additional management support is needed unless otherwise documented below in the visit note. 

## 2015-05-16 NOTE — Assessment & Plan Note (Signed)
Patient continues to have some difficulty with this. We discussed home exercises, icing protocol, and we discussed the importance of hip abductor strengthening. We discussed the possibility of patient working on her core which she has made some improvement. Patient does have topical anti-inflammatory's on an as-needed basis. Patient's will get new shoes and we discussed ergonomics and patient given a note for work. Patient and will come back and see me again in 3-4 weeks for further evaluation and treatment.

## 2015-05-16 NOTE — Progress Notes (Signed)
[name] [place], [place] [address] Phone: [phone] Subjective:    CC: hip pain and neck pain follow-up  [id number] Sydney [name] is a 49 y.o. female coming in with complaint of hip pain and left shoulder pain. Patient's hip pain patient states it is mostly on the outside aspect of her right hip. Patient was diagnosed with more of a sacroiliac joint dysfunction as well as having some mild radicular symptoms. Patient has responded fairly well to osteopathic manipulation. Patient continues to attempt to try to do the exercises on a regular basis. Patient denies any numbness or tingling. States that the radiation seems to be better controlled than usual. Patient states overall she seems to be doing relatively well. Patient states that her car as well as her bed does not give her any support. Patient is working on getting orthotics at work. He is wondering if she can have a letter for this.  Patient states that she continues to have some neck pain. Worse after long days. Patient states that it is more of an upper neck as well as upper back pain. Home exercises. Patient has not been doing them on a regular basis. Patient though has noticed that she is more tightness when she is standing on concrete for greater amount of time.      Past medical history, social, surgical and family history all reviewed in electronic medical record.   Review of Systems: No headache, visual changes, nausea, vomiting, diarrhea, constipation, dizziness, abdominal pain, skin rash, fevers, chills, night sweats, weight loss, swollen lymph nodes, body aches, joint swelling, muscle aches, chest pain, shortness of breath, mood changes.   Objective Blood pressure 118/82, pulse 57, height 5\' 2"  (1.575 m), weight 157 lb (71.215 kg), SpO2 97 %.  General: No apparent distress alert and oriented x3 mood and affect normal, dressed appropriately.  HEENT: Pupils equal, extraocular  movements intact  Respiratory: Patient's speak in full sentences and does not appear short of breath  Cardiovascular: No lower extremity edema, non tender, no erythema  Skin: Warm dry intact with no signs of infection or rash on extremities or on axial skeleton.  Abdomen: Soft nontender  Neuro: Cranial nerves II through XII are intact, neurovascularly intact in all extremities with 2+ DTRs and 2+ pulses.  Lymph: No lymphadenopathy of posterior or anterior cervical chain or axillae bilaterally.  Gait normal with good balance and coordination.  MSK:  Non tender with full range of motion and good stability and symmetric strength and tone of shoulders, elbows, wrist, hip, and ankles bilaterally.  Back Exam:  Inspection: Unremarkable  Motion: Flexion 45 deg, Extension 45 deg, Side Bending to 45 deg bilaterally,  Rotation to 45 deg bilaterally  SLR laying: Negative  XSLR laying: Negative  Palpable tenderness: Tenderness over sacroiliac joint still present for many mild improvement FABER: Positive right continued tightness Sensory change: Gross sensation intact to all lumbar and sacral dermatomes.  Reflexes: 2+ at both patellar tendons, 2+ at achilles tendons, Babinski's downgoing.  Strength at foot  Plantar-flexion: 5/5 Dorsi-flexion: 5/5 Eversion: 5/5 Inversion: 5/5  Leg strength  Quad: 5/5 Hamstring: 5/5 Hip flexor: 5/5 Hip abductors: 4/5 but symmetric Gait unremarkable. Mild improvement from previous exam   Osteopathic findings Thoracic T 5 extended rotated and side bent right Lumbar L2 flexed rotated and side bent right  Sacrum left on left      Impression and Recommendations:     This case required medical decision making  of moderate complexity.

## 2015-05-24 ENCOUNTER — Ambulatory Visit (INDEPENDENT_AMBULATORY_CARE_PROVIDER_SITE_OTHER): Payer: Federal, State, Local not specified - PPO | Admitting: Internal Medicine

## 2015-05-24 ENCOUNTER — Encounter: Payer: Self-pay | Admitting: Internal Medicine

## 2015-05-24 ENCOUNTER — Encounter: Payer: Self-pay | Admitting: *Deleted

## 2015-05-24 VITALS — BP 128/82 | HR 53 | Temp 98.4°F | Ht 62.0 in | Wt 156.0 lb

## 2015-05-24 DIAGNOSIS — I10 Essential (primary) hypertension: Secondary | ICD-10-CM | POA: Diagnosis not present

## 2015-05-24 DIAGNOSIS — R531 Weakness: Secondary | ICD-10-CM

## 2015-05-24 LAB — GLUCOSE, POCT (MANUAL RESULT ENTRY): POC Glucose: 93 mg/dl (ref 70–99)

## 2015-05-24 NOTE — Assessment & Plan Note (Addendum)
6/16 ?"pseudohypoglycemia" vs other CBGs at home - glucometer was given Labs

## 2015-05-24 NOTE — Progress Notes (Signed)
   Subjective:    HPI  C/o "sugar dropping" episode after lunch on some days. No LOC. C/o borderline DM  F/u HTN   Review of Systems  Constitutional: Negative for chills, activity change, appetite change, fatigue and unexpected weight change.  HENT: Negative for congestion, mouth sores and sinus pressure.   Eyes: Negative for visual disturbance.  Respiratory: Negative for cough, chest tightness and shortness of breath.   Gastrointestinal: Negative for nausea and abdominal pain.  Genitourinary: Negative for urgency, frequency, vaginal discharge, difficulty urinating and vaginal pain.  Musculoskeletal: Negative for back pain and gait problem.  Skin: Negative for pallor and rash.  Neurological: Negative for dizziness, tremors, weakness, numbness and headaches.  Psychiatric/Behavioral: Negative for confusion and sleep disturbance.    Wt Readings from Last 3 Encounters:  05/24/15 156 lb (70.761 kg)  05/16/15 157 lb (71.215 kg)  04/18/15 153 lb (69.4 kg)   BP Readings from Last 3 Encounters:  05/24/15 128/82  05/16/15 118/82  04/18/15 118/82        Objective:   Physical Exam  Constitutional: She appears well-developed. No distress.  HENT:  Head: Normocephalic.  Right Ear: External ear normal.  Left Ear: External ear normal.  Nose: Nose normal.  Mouth/Throat: Oropharynx is clear and moist.  Eyes: Conjunctivae are normal. Pupils are equal, round, and reactive to light. Right eye exhibits no discharge. Left eye exhibits no discharge.  Neck: Normal range of motion. Neck supple. No JVD present. No tracheal deviation present. No thyromegaly present.  Cardiovascular: Normal rate, regular rhythm and normal heart sounds.   Pulmonary/Chest: No stridor. No respiratory distress. She has no wheezes.  Abdominal: Soft. Bowel sounds are normal. She exhibits no distension and no mass. There is no tenderness. There is no rebound and no guarding.  Musculoskeletal: She exhibits no edema or  tenderness.  Lymphadenopathy:    She has no cervical adenopathy.  Neurological: She displays normal reflexes. No cranial nerve deficit. She exhibits normal muscle tone. Coordination normal.  Skin: No rash noted. No erythema.  Psychiatric: She has a normal mood and affect. Her behavior is normal. Judgment and thought content normal.    Lab Results  Component Value Date   WBC 4.4* 03/13/2013   HGB 13.7 03/13/2013   HCT 40.1 03/13/2013   PLT 219.0 03/13/2013   GLUCOSE 77 03/13/2013   CHOL 172 10/13/2010   TRIG 35.0 10/13/2010   HDL 103.30 10/13/2010   LDLCALC 62 10/13/2010   ALT 27 03/13/2013   AST 25 03/13/2013   NA 137 03/13/2013   K 4.1 03/13/2013   CL 104 03/13/2013   CREATININE 0.7 03/13/2013   BUN 15 03/13/2013   CO2 27 03/13/2013   TSH 1.50 03/13/2013   HGBA1C 6.1 10/13/2010     CBG 93     Assessment & Plan:

## 2015-05-24 NOTE — Progress Notes (Signed)
Pre visit review using our clinic review tool, if applicable. No additional management support is needed unless otherwise documented below in the visit note. 

## 2015-05-25 ENCOUNTER — Telehealth: Payer: Self-pay

## 2015-05-25 ENCOUNTER — Encounter: Payer: Self-pay | Admitting: Internal Medicine

## 2015-05-25 NOTE — Telephone Encounter (Signed)
Patient came back to our office about 45 min after your office visit with her yesterday---she was experiencing hypoglycemic event--glucose reading was 84, very shaky all over and didn't feel completely oriented, patient was wanting to know if you could place a referral for dietician/nutritionist to help with managing her diet for hypoglycemia to minimize these events----and do you have any other suggestions for how she can prevent or manage these episodes better----please advise, i will call her---patients phone is [phone]

## 2015-05-25 NOTE — Assessment & Plan Note (Signed)
Chronic  On Hyzaar

## 2015-05-25 NOTE — Telephone Encounter (Signed)
CBG 84 is WNL Pls come for fasting labs Thx

## 2015-05-27 NOTE — Telephone Encounter (Signed)
Tried to call patient.  MB full.

## 2015-05-27 NOTE — Telephone Encounter (Signed)
Labs are entered. Will you call pt and inform her to come in fasting for labs.

## 2015-05-30 NOTE — Telephone Encounter (Signed)
Patient states that her lab work from [facility] are supposed to be faxed over. She is concerned about doing the same lab work over again and insurance won't pay for.

## 2015-05-30 NOTE — Telephone Encounter (Signed)
I tried calling pt- no answer/unable to leave vm. Closing phone note.

## 2015-06-20 ENCOUNTER — Ambulatory Visit (INDEPENDENT_AMBULATORY_CARE_PROVIDER_SITE_OTHER): Payer: Federal, State, Local not specified - PPO | Admitting: Family Medicine

## 2015-06-20 ENCOUNTER — Encounter: Payer: Self-pay | Admitting: Family Medicine

## 2015-06-20 VITALS — BP 118/82 | HR 56 | Wt 155.0 lb

## 2015-06-20 DIAGNOSIS — M533 Sacrococcygeal disorders, not elsewhere classified: Secondary | ICD-10-CM

## 2015-06-20 DIAGNOSIS — M9903 Segmental and somatic dysfunction of lumbar region: Secondary | ICD-10-CM

## 2015-06-20 DIAGNOSIS — M999 Biomechanical lesion, unspecified: Secondary | ICD-10-CM

## 2015-06-20 DIAGNOSIS — M9902 Segmental and somatic dysfunction of thoracic region: Secondary | ICD-10-CM

## 2015-06-20 DIAGNOSIS — M9904 Segmental and somatic dysfunction of sacral region: Secondary | ICD-10-CM | POA: Diagnosis not present

## 2015-06-20 NOTE — Assessment & Plan Note (Signed)
Decision today to treat with OMT was based on Physical Exam  After verbal consent patient was treated with HVLA, ME techniques in the right side, lumbar and sacral areas  Patient tolerated the procedure well with improvement in symptoms  Patient given exercises, stretches and lifestyle modifications  See medications in patient instructions if given  Patient will follow up in 6 weeks

## 2015-06-20 NOTE — Progress Notes (Signed)
  [name] [place], [place] [address] Phone: [phone] Subjective:    CC: hip pain and neck pain follow-up  [id number] Sydney [name] is a 49 y.o. female coming in with complaint of hip pain and left shoulder pain. Patient's hip pain patient states it is mostly on the outside aspect of her right hip. Patient was diagnosed with more of a sacroiliac joint dysfunction as well as having some mild radicular symptoms. Patient has responded fairly well to osteopathic manipulation. Patient continues to try to increase her activity. Patient is standing active. Some mild tightness of the right side of her back but nothing that it was previously and no radicular symptoms. Neck pain seems to be better as well. Patient is doing much more of the postural exercises and has noticed that this is been helpful.     Past medical history, social, surgical and family history all reviewed in electronic medical record.   Review of Systems: No headache, visual changes, nausea, vomiting, diarrhea, constipation, dizziness, abdominal pain, skin rash, fevers, chills, night sweats, weight loss, swollen lymph nodes, body aches, joint swelling, muscle aches, chest pain, shortness of breath, mood changes.   Objective Blood pressure 118/82, pulse 56, weight 155 lb (70.308 kg), SpO2 97 %.  General: No apparent distress alert and oriented x3 mood and affect normal, dressed appropriately.  HEENT: Pupils equal, extraocular movements intact  Respiratory: Patient's speak in full sentences and does not appear short of breath  Cardiovascular: No lower extremity edema, non tender, no erythema  Skin: Warm dry intact with no signs of infection or rash on extremities or on axial skeleton.  Abdomen: Soft nontender  Neuro: Cranial nerves II through XII are intact, neurovascularly intact in all extremities with 2+ DTRs and 2+ pulses.  Lymph: No lymphadenopathy of posterior or anterior  cervical chain or axillae bilaterally.  Gait normal with good balance and coordination.  MSK:  Non tender with full range of motion and good stability and symmetric strength and tone of shoulders, elbows, wrist, hip, and ankles bilaterally.  Back Exam:  Inspection: Unremarkable  Motion: Flexion 45 deg, Extension 45 deg, Side Bending to 45 deg bilaterally,  Rotation to 45 deg bilaterally  SLR laying: Negative  XSLR laying: Negative  Palpable tenderness: Still tender over the sacroiliac joint but improved FABER: Would consider negative which is an improvement Sensory change: Gross sensation intact to all lumbar and sacral dermatomes.  Reflexes: 2+ at both patellar tendons, 2+ at achilles tendons, Babinski's downgoing.  Strength at foot  Plantar-flexion: 5/5 Dorsi-flexion: 5/5 Eversion: 5/5 Inversion: 5/5  Leg strength  Quad: 5/5 Hamstring: 5/5 Hip flexor: 5/5 Hip abductors: 4/5 but symmetric Gait unremarkable. Continued improvement   Osteopathic findings Cervical C4 flexed rotated and side bent right Thoracic T 5 extended rotated and side bent right Lumbar L2 flexed rotated and side bent right  Sacrum left on left  Same pattern as previously    Impression and Recommendations:     This case required medical decision making of moderate complexity.

## 2015-06-20 NOTE — Patient Instructions (Addendum)
Good to see you Ice will always be your friend Continue to do what you are doing.  Continue the vitamins One new exercise for the hip flexor I showed you See me again in 6 weeks

## 2015-06-20 NOTE — Assessment & Plan Note (Signed)
Patient is doing much better overall. We discussed icing regimen and home exercise. We discussed which activities to do and which ones to potentially avoid. We discussed the importance of core strengthening patient will continue with the hip abductor strengthening as well. Patient will come back in 6 weeks for further evaluation

## 2015-08-01 ENCOUNTER — Ambulatory Visit: Payer: Federal, State, Local not specified - PPO | Admitting: Family Medicine

## 2015-08-29 ENCOUNTER — Ambulatory Visit (INDEPENDENT_AMBULATORY_CARE_PROVIDER_SITE_OTHER): Payer: Federal, State, Local not specified - PPO | Admitting: Internal Medicine

## 2015-08-29 ENCOUNTER — Encounter: Payer: Self-pay | Admitting: Internal Medicine

## 2015-08-29 VITALS — BP 138/85 | HR 55 | Wt 160.0 lb

## 2015-08-29 DIAGNOSIS — I10 Essential (primary) hypertension: Secondary | ICD-10-CM | POA: Diagnosis not present

## 2015-08-29 MED ORDER — LOSARTAN POTASSIUM-HCTZ 100-25 MG PO TABS: 1.0000 | ORAL_TABLET | Freq: Every day | ORAL | Status: DC

## 2015-08-29 NOTE — Assessment & Plan Note (Signed)
On Hyzaar 

## 2015-08-29 NOTE — Progress Notes (Signed)
Subjective:  Patient ID: Sydney [name], female    DOB: 04-Mar-1966  Age: 48 y.o. MRN: [id number]  CC: No chief complaint on file.   HPI Sydney [name] presents for HTN f/u  Outpatient Prescriptions Prior to Visit  Medication Sig Dispense Refill  . Biotin 1000 MCG tablet Take 1 tablet (1 mg total) by mouth daily. 90 tablet 3  . cholecalciferol (VITAMIN D) 1000 UNITS tablet Take 1 tablet (1,000 Units total) by mouth daily. 90 tablet 3  . Diclofenac Sodium 2 % SOLN Apply 1 pump twice daily. 112 g 3  . ferrous sulfate 325 (65 FE) MG EC tablet Take 1 tablet (325 mg total) by mouth daily with breakfast. 90 tablet 3  . fish oil-omega-3 fatty acids 1000 MG capsule Take 1 g by mouth daily.    . hydrocortisone 1 % lotion Apply 1 application topically daily. 59 mL 0  . ibuprofen (ADVIL,MOTRIN) 200 MG tablet Take 200 mg by mouth daily as needed.    . pyridoxine (B-6) 200 MG tablet Take 1 tablet (200 mg total) by mouth daily. 90 tablet 2  . vitamin A 10000 UNIT capsule Take 1 capsule (10,000 Units total) by mouth daily. 90 capsule 3  . vitamin B-12 (CYANOCOBALAMIN) 1000 MCG tablet Take 1 tablet (1,000 mcg total) by mouth daily. 90 tablet 3  . Vitamins C E (VITAMIN C & E COMBINATION) 500-400 MG-UNIT CAPS Take 1 capsule daily 90 each 3  . losartan-hydrochlorothiazide (HYZAAR) 100-25 MG per tablet Take 1 tablet by mouth daily. 90 tablet 3   No facility-administered medications prior to visit.    ROS Review of Systems  Constitutional: Negative for chills, activity change, appetite change, fatigue and unexpected weight change.  HENT: Negative for congestion, mouth sores and sinus pressure.   Eyes: Negative for visual disturbance.  Respiratory: Negative for cough and chest tightness.   Gastrointestinal: Negative for nausea and abdominal pain.  Genitourinary: Negative for frequency, difficulty urinating and vaginal pain.  Musculoskeletal: Negative for back pain and gait problem.  Skin: Negative  for pallor and rash.  Neurological: Negative for dizziness, tremors, weakness, numbness and headaches.  Psychiatric/Behavioral: Negative for suicidal ideas, confusion and sleep disturbance.    Objective:  BP 138/85 mmHg  Pulse 55  Wt 160 lb (72.576 kg)  SpO2 99%  BP Readings from Last 3 Encounters:  08/29/15 138/85  06/20/15 118/82  05/24/15 128/82    Wt Readings from Last 3 Encounters:  08/29/15 160 lb (72.576 kg)  06/20/15 155 lb (70.308 kg)  05/24/15 156 lb (70.761 kg)    Physical Exam  Constitutional: She appears well-developed. No distress.  HENT:  Head: Normocephalic.  Right Ear: External ear normal.  Left Ear: External ear normal.  Nose: Nose normal.  Mouth/Throat: Oropharynx is clear and moist.  Eyes: Conjunctivae are normal. Pupils are equal, round, and reactive to light. Right eye exhibits no discharge. Left eye exhibits no discharge.  Neck: Normal range of motion. Neck supple. No JVD present. No tracheal deviation present. No thyromegaly present.  Cardiovascular: Normal rate, regular rhythm and normal heart sounds.   Pulmonary/Chest: No stridor. No respiratory distress. She has no wheezes.  Abdominal: Soft. Bowel sounds are normal. She exhibits no distension and no mass. There is no tenderness. There is no rebound and no guarding.  Musculoskeletal: She exhibits no edema or tenderness.  Lymphadenopathy:    She has no cervical adenopathy.  Neurological: She displays normal reflexes. No cranial nerve deficit. She exhibits normal muscle tone.  Coordination normal.  Skin: No rash noted. No erythema.  Psychiatric: She has a normal mood and affect. Her behavior is normal. Thought content normal.    Lab Results  Component Value Date   WBC 4.4* 03/13/2013   HGB 13.7 03/13/2013   HCT 40.1 03/13/2013   PLT 219.0 03/13/2013   GLUCOSE 77 03/13/2013   CHOL 172 10/13/2010   TRIG 35.0 10/13/2010   HDL 103.30 10/13/2010   LDLCALC 62 10/13/2010   ALT 27 03/13/2013    AST 25 03/13/2013   NA 137 03/13/2013   K 4.1 03/13/2013   CL 104 03/13/2013   CREATININE 0.7 03/13/2013   BUN 15 03/13/2013   CO2 27 03/13/2013   TSH 1.50 03/13/2013   HGBA1C 6.1 10/13/2010    Ct Head Wo Contrast  05/28/2012   *RADIOLOGY REPORT*  Clinical Data: 49 year old female with headache and insomnia.  CT HEAD WITHOUT CONTRAST  Technique:  Contiguous axial images were obtained from the base of the skull through the vertex without contrast.  Comparison: None.  Findings: Visualized paranasal sinuses and mastoids are clear.  No acute osseous abnormality identified.  Visualized orbits and scalp soft tissues are within normal limits.  Cerebral volume is within normal limits for age.  No midline shift, ventriculomegaly, mass effect, evidence of mass lesion, intracranial hemorrhage or evidence of cortically based acute infarction.  Gray-white matter differentiation is within normal limits throughout the brain.  No suspicious intracranial vascular hyperdensity.  IMPRESSION: Normal noncontrast CT appearance of the brain.  Original Report Authenticated By: Randall An, M.D.   Assessment & Plan:   Diagnoses and all orders for this visit:  Essential hypertension  Other orders -     losartan-hydrochlorothiazide (HYZAAR) 100-25 MG per tablet; Take 1 tablet by mouth daily.  I am having Ms. Scurlock maintain her fish oil-omega-3 fatty acids, cholecalciferol, vitamin B-12, vitamin A, VITAMIN C & E COMBINATION, Biotin, ibuprofen, pyridoxine, ferrous sulfate, hydrocortisone, Diclofenac Sodium, and losartan-hydrochlorothiazide.  Meds ordered this encounter  Medications  . losartan-hydrochlorothiazide (HYZAAR) 100-25 MG per tablet    Sig: Take 1 tablet by mouth daily.    Dispense:  90 tablet    Refill:  3     Follow-up: Return in about 6 months (around 02/26/2016) for Wellness Exam.  Walker Kehr, MD

## 2015-08-29 NOTE — Progress Notes (Signed)
Pre visit review using our clinic review tool, if applicable. No additional management support is needed unless otherwise documented below in the visit note. 

## 2015-09-08 ENCOUNTER — Ambulatory Visit: Payer: Federal, State, Local not specified - PPO | Admitting: Family Medicine

## 2015-09-08 ENCOUNTER — Ambulatory Visit (INDEPENDENT_AMBULATORY_CARE_PROVIDER_SITE_OTHER): Payer: Federal, State, Local not specified - PPO | Admitting: Family Medicine

## 2015-09-08 ENCOUNTER — Encounter: Payer: Self-pay | Admitting: Family Medicine

## 2015-09-08 VITALS — BP 126/82 | HR 60 | Ht 62.0 in | Wt 158.0 lb

## 2015-09-08 DIAGNOSIS — M533 Sacrococcygeal disorders, not elsewhere classified: Secondary | ICD-10-CM | POA: Diagnosis not present

## 2015-09-08 DIAGNOSIS — M9904 Segmental and somatic dysfunction of sacral region: Secondary | ICD-10-CM | POA: Diagnosis not present

## 2015-09-08 DIAGNOSIS — M999 Biomechanical lesion, unspecified: Secondary | ICD-10-CM

## 2015-09-08 NOTE — Assessment & Plan Note (Signed)
Continues to have some increasing in discomfort. We discussed the icing regimen and the home exercises. We discussed which activities once to do an which was potentially avoid. We discussed the importance of the core strength as well as the lower abdominal region. Patient is not to continue to work on this. Mild changes and exercise routine done. Discussed lifting mechanics. Patient will come back and see me again in 6-8 weeks for further evaluation and treatment.

## 2015-09-08 NOTE — Patient Instructions (Addendum)
Good to see you Ice is your friend when needed Keep up all the activity Watch your protein for at least a week (my fitness pal) Make sure you have 10 cups of water.  See me again 6-8 weeks.

## 2015-09-08 NOTE — Progress Notes (Signed)
  [name] [place], [place] [address] Phone: [phone] Subjective:    CC: hip pain and neck pain follow-up  CHY:[id number] Sydney [name] is a 49 y.o. female coming in with complaint of hip pain and left shoulder pain. Patient's hip pain patient states it is mostly on the outside aspect of her right hip. Patient was diagnosed with more of a sacroiliac joint dysfunction as well as having some mild radicular symptoms. Patient has responded fairly well to osteopathic manipulation. Patient continues to try to increase her activity. 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 Still some mild discomfort on the right side with some mild radicular symptom she states on the lateral aspect of leg. This seems to be very intermittent. Nothing that is stopping her activities on a regular basis. We discussed icing regimen which patient is only doing them intermittently. Patient though is fairly happy with the results but would of course like to be pain-free.     Past medical history, social, surgical and family history all reviewed in electronic medical record.   Review of Systems: No headache, visual changes, nausea, vomiting, diarrhea, constipation, dizziness, abdominal pain, skin rash, fevers, chills, night sweats, weight loss, swollen lymph nodes, body aches, joint swelling, muscle aches, chest pain, shortness of breath, mood changes.   Objective Blood pressure 126/82, pulse 60, height 5\' 2"  (1.575 m), weight 158 lb (71.668 kg), SpO2 97 %.  General: No apparent distress alert and oriented x3 mood and affect normal, dressed appropriately.  HEENT: Pupils equal, extraocular movements intact  Respiratory: Patient's speak in full sentences and does not appear short of breath  Cardiovascular: No lower extremity edema, non tender, no erythema  Skin: Warm dry intact with no signs of infection or rash on  extremities or on axial skeleton.  Abdomen: Soft nontender  Neuro: Cranial nerves II through XII are intact, neurovascularly intact in all extremities with 2+ DTRs and 2+ pulses.  Lymph: No lymphadenopathy of posterior or anterior cervical chain or axillae bilaterally.  Gait normal with good balance and coordination.  MSK:  Non tender with full range of motion and good stability and symmetric strength and tone of shoulders, elbows, wrist, hip, and ankles bilaterally.  Back Exam:  Inspection: Unremarkable  Motion: Flexion 45 deg, Extension 45 deg, Side Bending to 45 deg bilaterally,  Rotation to 45 deg bilaterally  SLR laying: Negative  XSLR laying: Negative  Palpable tenderness: Continued tenderness over the sacroiliac joints bilaterally right greater than left FABER: Would consider negative which is an improvement Sensory change: Gross sensation intact to all lumbar and sacral dermatomes.  Reflexes: 2+ at both patellar tendons, 2+ at achilles tendons, Babinski's downgoing.  Strength at foot  Plantar-flexion: 5/5 Dorsi-flexion: 5/5 Eversion: 5/5 Inversion: 5/5  Leg strength  Quad: 5/5 Hamstring: 5/5 Hip flexor: 5/5 Hip abductors: 4+/5 but symmetric and improving Gait unremarkable. Continued improvement   Osteopathic findings Cervical C4 flexed rotated and side bent right Thoracic T 5 extended rotated and side bent right Lumbar L2 flexed rotated and side bent right  Sacrum left on left  Continued same pattern    Impression and Recommendations:     This case required medical decision making of moderate complexity.

## 2015-09-08 NOTE — Assessment & Plan Note (Signed)
Decision today to treat with OMT was based on Physical Exam  After verbal consent patient was treated with HVLA, ME techniques in the right side, lumbar and sacral areas  Patient tolerated the procedure well with improvement in symptoms  Patient given exercises, stretches and lifestyle modifications  See medications in patient instructions if given  Patient will follow up in 6-8 weeks

## 2015-09-08 NOTE — Progress Notes (Signed)
Pre visit review using our clinic review tool, if applicable. No additional management support is needed unless otherwise documented below in the visit note. 

## 2015-10-20 ENCOUNTER — Ambulatory Visit: Payer: Federal, State, Local not specified - PPO | Admitting: Family Medicine

## 2015-12-26 ENCOUNTER — Ambulatory Visit: Payer: Federal, State, Local not specified - PPO | Admitting: Family Medicine

## 2015-12-26 ENCOUNTER — Telehealth: Payer: Self-pay | Admitting: Family Medicine

## 2015-12-26 NOTE — Telephone Encounter (Signed)
Patient had an appointment today, and missed due to misunderstanding. She has a 2:45 on Wednesday with dr plotnikov. Is it possible to fit her in around that time to see her? Next available access is next week. Thanks.

## 2015-12-27 NOTE — Telephone Encounter (Signed)
12:45p appt is no longer available. Called her back to offer her a 2:30 appt. Unable to leave a msg.

## 2015-12-27 NOTE — Telephone Encounter (Signed)
Called pt to offer her 12:45 on Wednesday. Her voicemail box is full, unable to leave a msg.

## 2015-12-28 ENCOUNTER — Encounter: Payer: Self-pay | Admitting: Internal Medicine

## 2015-12-28 ENCOUNTER — Ambulatory Visit (INDEPENDENT_AMBULATORY_CARE_PROVIDER_SITE_OTHER): Payer: Federal, State, Local not specified - PPO | Admitting: Internal Medicine

## 2015-12-28 ENCOUNTER — Other Ambulatory Visit (INDEPENDENT_AMBULATORY_CARE_PROVIDER_SITE_OTHER): Payer: Federal, State, Local not specified - PPO

## 2015-12-28 VITALS — BP 164/108 | HR 57 | Wt 161.0 lb

## 2015-12-28 DIAGNOSIS — I10 Essential (primary) hypertension: Secondary | ICD-10-CM

## 2015-12-28 DIAGNOSIS — R635 Abnormal weight gain: Secondary | ICD-10-CM | POA: Diagnosis not present

## 2015-12-28 LAB — BASIC METABOLIC PANEL
BUN: 18 mg/dL (ref 6–23)
CHLORIDE: 103 meq/L (ref 96–112)
CO2: 30 mEq/L (ref 19–32)
CREATININE: 0.78 mg/dL (ref 0.40–1.20)
Calcium: 9.6 mg/dL (ref 8.4–10.5)
GFR: 101.58 mL/min (ref >60.00)
Glucose, Bld: 93 mg/dL (ref 70–99)
Potassium: 4.2 mEq/L (ref 3.5–5.1)
Sodium: 138 mEq/L (ref 135–145)

## 2015-12-28 LAB — TSH: TSH: 1.06 u[IU]/mL (ref 0.35–4.50)

## 2015-12-28 MED ORDER — LOSARTAN POTASSIUM-HCTZ 100-25 MG PO TABS: 1.0000 | ORAL_TABLET | Freq: Every day | ORAL | Status: DC

## 2015-12-28 NOTE — Assessment & Plan Note (Signed)
Labs

## 2015-12-28 NOTE — Progress Notes (Signed)
Subjective:  Patient ID: Sydney [name], female    DOB: 10-14-1966  Age: 50 y.o. MRN: [id number]  CC: No chief complaint on file.   HPI Sydney [name] presents for HTN f/u. Pt ran out of med in Nov 2016  Outpatient Prescriptions Prior to Visit  Medication Sig Dispense Refill  . Biotin 1000 MCG tablet Take 1 tablet (1 mg total) by mouth daily. 90 tablet 3  . cholecalciferol (VITAMIN D) 1000 UNITS tablet Take 1 tablet (1,000 Units total) by mouth daily. 90 tablet 3  . Diclofenac Sodium 2 % SOLN Apply 1 pump twice daily. 112 g 3  . ferrous sulfate 325 (65 FE) MG EC tablet Take 1 tablet (325 mg total) by mouth daily with breakfast. 90 tablet 3  . fish oil-omega-3 fatty acids 1000 MG capsule Take 1 g by mouth daily.    . hydrocortisone 1 % lotion Apply 1 application topically daily. 59 mL 0  . ibuprofen (ADVIL,MOTRIN) 200 MG tablet Take 200 mg by mouth daily as needed.    . pyridoxine (B-6) 200 MG tablet Take 1 tablet (200 mg total) by mouth daily. 90 tablet 2  . vitamin A 10000 UNIT capsule Take 1 capsule (10,000 Units total) by mouth daily. 90 capsule 3  . vitamin B-12 (CYANOCOBALAMIN) 1000 MCG tablet Take 1 tablet (1,000 mcg total) by mouth daily. 90 tablet 3  . Vitamins C E (VITAMIN C & E COMBINATION) 500-400 MG-UNIT CAPS Take 1 capsule daily 90 each 3  . losartan-hydrochlorothiazide (HYZAAR) 100-25 MG per tablet Take 1 tablet by mouth daily. 90 tablet 3   No facility-administered medications prior to visit.    ROS Review of Systems  Constitutional: Positive for unexpected weight change. Negative for chills, activity change, appetite change and fatigue.  HENT: Negative for congestion, mouth sores and sinus pressure.   Eyes: Negative for visual disturbance.  Respiratory: Negative for cough and chest tightness.   Gastrointestinal: Negative for nausea and abdominal pain.  Genitourinary: Negative for frequency, difficulty urinating and vaginal pain.  Musculoskeletal: Negative for  back pain and gait problem.  Skin: Negative for pallor and rash.  Neurological: Negative for dizziness, tremors, weakness, numbness and headaches.  Psychiatric/Behavioral: Negative for confusion and sleep disturbance.    Objective:  BP 164/108 mmHg  Pulse 57  Wt 161 lb (73.029 kg)  SpO2 98%  BP Readings from Last 3 Encounters:  12/28/15 164/108  09/08/15 126/82  08/29/15 138/85    Wt Readings from Last 3 Encounters:  12/28/15 161 lb (73.029 kg)  09/08/15 158 lb (71.668 kg)  08/29/15 160 lb (72.576 kg)    Physical Exam  Constitutional: She appears well-developed. No distress.  HENT:  Head: Normocephalic.  Right Ear: External ear normal.  Left Ear: External ear normal.  Nose: Nose normal.  Mouth/Throat: Oropharynx is clear and moist.  Eyes: Conjunctivae are normal. Pupils are equal, round, and reactive to light. Right eye exhibits no discharge. Left eye exhibits no discharge.  Neck: Normal range of motion. Neck supple. No JVD present. No tracheal deviation present. No thyromegaly present.  Cardiovascular: Normal rate, regular rhythm and normal heart sounds.   Pulmonary/Chest: No stridor. No respiratory distress. She has no wheezes.  Abdominal: Soft. Bowel sounds are normal. She exhibits no distension and no mass. There is no tenderness. There is no rebound and no guarding.  Musculoskeletal: She exhibits no edema or tenderness.  Lymphadenopathy:    She has no cervical adenopathy.  Neurological: She displays normal reflexes. No  cranial nerve deficit. She exhibits normal muscle tone. Coordination normal.  Skin: No rash noted. No erythema.  Psychiatric: She has a normal mood and affect. Her behavior is normal. Judgment and thought content normal.    Lab Results  Component Value Date   WBC 4.4* 03/13/2013   HGB 13.7 03/13/2013   HCT 40.1 03/13/2013   PLT 219.0 03/13/2013   GLUCOSE 93 12/28/2015   CHOL 172 10/13/2010   TRIG 35.0 10/13/2010   HDL 103.30 10/13/2010    LDLCALC 62 10/13/2010   ALT 27 03/13/2013   AST 25 03/13/2013   NA 138 12/28/2015   K 4.2 12/28/2015   CL 103 12/28/2015   CREATININE 0.78 12/28/2015   BUN 18 12/28/2015   CO2 30 12/28/2015   TSH 1.06 12/28/2015   HGBA1C 6.1 10/13/2010    Ct Head Wo Contrast  05/28/2012  *RADIOLOGY REPORT* Clinical Data: 50 year old female with headache and insomnia. CT HEAD WITHOUT CONTRAST Technique:  Contiguous axial images were obtained from the base of the skull through the vertex without contrast. Comparison: None. Findings: Visualized paranasal sinuses and mastoids are clear.  No acute osseous abnormality identified.  Visualized orbits and scalp soft tissues are within normal limits. Cerebral volume is within normal limits for age.  No midline shift, ventriculomegaly, mass effect, evidence of mass lesion, intracranial hemorrhage or evidence of cortically based acute infarction.  Gray-white matter differentiation is within normal limits throughout the brain.  No suspicious intracranial vascular hyperdensity. IMPRESSION: Normal noncontrast CT appearance of the brain. Original Report Authenticated By: Randall An, M.D.   Assessment & Plan:   Diagnoses and all orders for this visit:  Essential hypertension -     TSH; Future -     Basic metabolic panel; Future  Weight gain -     TSH; Future -     Basic metabolic panel; Future  Other orders -     losartan-hydrochlorothiazide (HYZAAR) 100-25 MG tablet; Take 1 tablet by mouth daily.  I have changed Sydney [name]'s losartan-hydrochlorothiazide. I am also having her maintain her fish oil-omega-3 fatty acids, cholecalciferol, vitamin B-12, vitamin A, VITAMIN C & E COMBINATION, Biotin, ibuprofen, pyridoxine, ferrous sulfate, hydrocortisone, and Diclofenac Sodium.  Meds ordered this encounter  Medications  . losartan-hydrochlorothiazide (HYZAAR) 100-25 MG tablet    Sig: Take 1 tablet by mouth daily.    Dispense:  90 tablet    Refill:  3      Follow-up: Return in about 6 months (around 06/26/2016) for Wellness Exam.  Walker Kehr, MD

## 2015-12-28 NOTE — Assessment & Plan Note (Signed)
Re-start Rx - Hyzaar Risks associated with treatment noncompliance were discussed. Compliance was encouraged.

## 2015-12-28 NOTE — Progress Notes (Signed)
Pre visit review using our clinic review tool, if applicable. No additional management support is needed unless otherwise documented below in the visit note. 

## 2015-12-29 ENCOUNTER — Encounter: Payer: Self-pay | Admitting: Internal Medicine

## 2015-12-29 NOTE — Telephone Encounter (Signed)
Patient scheduled by front desk to be seen on Jan 31st

## 2016-01-03 ENCOUNTER — Ambulatory Visit (INDEPENDENT_AMBULATORY_CARE_PROVIDER_SITE_OTHER)
Admission: RE | Admit: 2016-01-03 | Discharge: 2016-01-03 | Disposition: A | Discharge status: Home / Self Care | Payer: Federal, State, Local not specified - PPO | Source: Ambulatory Visit | Attending: Family Medicine | Admitting: Family Medicine

## 2016-01-03 ENCOUNTER — Encounter: Payer: Self-pay | Admitting: Family Medicine

## 2016-01-03 ENCOUNTER — Ambulatory Visit (INDEPENDENT_AMBULATORY_CARE_PROVIDER_SITE_OTHER): Payer: Federal, State, Local not specified - PPO | Admitting: Family Medicine

## 2016-01-03 VITALS — BP 128/80 | HR 64 | Wt 162.0 lb

## 2016-01-03 DIAGNOSIS — M222X2 Patellofemoral disorders, left knee: Secondary | ICD-10-CM

## 2016-01-03 DIAGNOSIS — M9902 Segmental and somatic dysfunction of thoracic region: Secondary | ICD-10-CM | POA: Diagnosis not present

## 2016-01-03 DIAGNOSIS — M9904 Segmental and somatic dysfunction of sacral region: Secondary | ICD-10-CM | POA: Diagnosis not present

## 2016-01-03 DIAGNOSIS — M999 Biomechanical lesion, unspecified: Secondary | ICD-10-CM

## 2016-01-03 DIAGNOSIS — M9903 Segmental and somatic dysfunction of lumbar region: Secondary | ICD-10-CM

## 2016-01-03 DIAGNOSIS — M222X1 Patellofemoral disorders, right knee: Secondary | ICD-10-CM | POA: Insufficient documentation

## 2016-01-03 DIAGNOSIS — M533 Sacrococcygeal disorders, not elsewhere classified: Secondary | ICD-10-CM

## 2016-01-03 NOTE — Addendum Note (Signed)
Addended by: Douglass Rivers T on: 01/03/2016 02:24 PM   Modules accepted: Orders

## 2016-01-03 NOTE — Progress Notes (Signed)
[name] [place], [place] [address] Phone: [phone] Subjective:    CC: Right hip pain and bilateral knee pain  [id number] Sydney [name] is a 50 y.o. female coming in with complaint of Right hip pain and bilateral knee pain. Patient is having more of right sacroiliac joint dysfunction. Had been responding very well to osteopathic manipulation. Patient has been doing the exercises. 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 Denies any numbness, denies any radiation. Rates the severity of pain though at this point is 6 out of 10. Seems to be worsening over the course of time. Had been responding previously very well.  Patient is also having bilateral knee pain. Has been seen for this one time previously. Never had any significant workup. Has noticed that she is feels a little more stiff. Unable to flex her knee is completely bilaterally she states. Denies any giving out on her any instability. Denies any radiation down the leg. Rates the severity of pain is 4 out of 10. Just concerned because she used to be able to be much more flexible. \     Past medical history, social, surgical and family history all reviewed in electronic medical record.   Review of Systems: No headache, visual changes, nausea, vomiting, diarrhea, constipation, dizziness, abdominal pain, skin rash, fevers, chills, night sweats, weight loss, swollen lymph nodes, body aches, joint swelling, muscle aches, chest pain, shortness of breath, mood changes.   Objective Blood pressure 128/80, pulse 64, weight 162 lb (73.483 kg).  General: No apparent distress alert and oriented x3 mood and affect normal, dressed appropriately.  HEENT: Pupils equal, extraocular movements intact  Respiratory: Patient's speak in full sentences and does not appear short of breath  Cardiovascular: No lower  extremity edema, non tender, no erythema  Skin: Warm dry intact with no signs of infection or rash on extremities or on axial skeleton.  Abdomen: Soft nontender  Neuro: Cranial nerves II through XII are intact, neurovascularly intact in all extremities with 2+ DTRs and 2+ pulses.  Lymph: No lymphadenopathy of posterior or anterior cervical chain or axillae bilaterally.  Gait normal with good balance and coordination.  MSK:  Non tender with full range of motion and good stability and symmetric strength and tone of shoulders, elbows, wrist, hip, and ankles bilaterally.  Knee: Bilateral Normal to inspection with no erythema or effusion or obvious bony abnormalities. Mild discomfort with palpation over the patella. Mild lateral tracking noted bilaterally ROM full in flexion and extension and lower leg rotation. Ligaments with solid consistent endpoints including ACL, PCL, LCL, MCL. Negative Mcmurray's, Apley's, and Thessalonian tests. Non painful patellar compression. Patellar glide without crepitus. Patellar and quadriceps tendons unremarkable. Hamstring and quadriceps strength is normal.   Back Exam:  Inspection: Unremarkable  Motion: Flexion 45 deg, Extension 45 deg, Side Bending to 45 deg bilaterally,  Rotation to 45 deg bilaterally  SLR laying: Negative  XSLR laying: Negative  Palpable tenderness: Or tenderness over the right sacroiliac joint FABER: Positive and worse than previous exam Sensory change: Gross sensation intact to all lumbar and sacral dermatomes.  Reflexes: 2+ at both patellar tendons, 2+ at achilles tendons, Babinski's downgoing.  Strength at foot  Plantar-flexion: 5/5 Dorsi-flexion: 5/5 Eversion: 5/5 Inversion: 5/5  Leg strength  Quad: 5/5 Hamstring: 5/5 Hip flexor: 5/5 Hip abductors: 4+/5 but symmetric and improving Gait unremarkable. Continued improvement  Osteopathic findings Cervical C4 flexed rotated and side bent right Thoracic T3 extended rotated and  side bent right T 5 extended rotated and side bent right Lumbar L2 flexed rotated and side bent right  Sacrum left on left      Impression and Recommendations:     This case required medical decision making of moderate complexity.

## 2016-01-03 NOTE — Assessment & Plan Note (Signed)
Patient is more of a patellofemoral syndrome bilaterally. Patient has some mild lateral tracking noted. X-rays ordered today. Home exercises given. Patient will try topical anti-inflammatories. Return in 3-4 weeks. If continuing have trouble we'll consider ultrasound as well as possible injections.

## 2016-01-03 NOTE — Assessment & Plan Note (Signed)
Decision today to treat with OMT was based on Physical Exam  After verbal consent patient was treated with HVLA, ME techniques in the right side, lumbar and sacral areas  Patient tolerated the procedure well with improvement in symptoms  Patient given exercises, stretches and lifestyle modifications  See medications in patient instructions if given  Patient will follow up in 3-4 weeks      

## 2016-01-03 NOTE — Patient Instructions (Signed)
Good to see you  Ice 20 minutes 2 times daily. Usually after activity and before bed. Exercises 3 times a week.  We will get xray of knee today  pennsaid pinkie amount topically 2 times daily as needed.  See me again in 3 weeks to make sure knee is improving.

## 2016-01-03 NOTE — Progress Notes (Signed)
Pre visit review using our clinic review tool, if applicable. No additional management support is needed unless otherwise documented below in the visit note. 

## 2016-01-03 NOTE — Assessment & Plan Note (Signed)
Continues to have discomfort overall. Patient doing okay with manipulation. We discussed icing regimen and home exercises. Patient given topical anti-inflammatories. Patient doing some icing pedicle. Patient will continue with vitamin D supplementation. Encourage patient to continue to work on core strength. Patient will follow-up again in 3-4 weeks for further evaluation and treatment.

## 2016-01-16 ENCOUNTER — Telehealth: Payer: Self-pay | Admitting: Internal Medicine

## 2016-01-16 NOTE — Telephone Encounter (Signed)
Use over-the-counter  "cold" medicines  such as " Delsym" or" Robitussin" cough syrup varietis for cough. Use Halls or Ricola cough drops. Please, make an appointment if you are not better or if you're worse. Thx

## 2016-01-16 NOTE — Telephone Encounter (Signed)
Pt called in and said that she has had a cough for 10 days and would like to know what nurse would recommend for her to take because she has high BP.  Can you call her when you get a chance ?    Her Cell number is best

## 2016-01-17 NOTE — Telephone Encounter (Signed)
Notified patient.

## 2016-01-18 ENCOUNTER — Encounter: Payer: Self-pay | Admitting: Nurse Practitioner

## 2016-01-18 ENCOUNTER — Ambulatory Visit (INDEPENDENT_AMBULATORY_CARE_PROVIDER_SITE_OTHER): Payer: Federal, State, Local not specified - PPO | Admitting: Nurse Practitioner

## 2016-01-18 VITALS — BP 144/98 | HR 59 | Temp 98.0°F | Ht 62.0 in | Wt 163.1 lb

## 2016-01-18 DIAGNOSIS — J069 Acute upper respiratory infection, unspecified: Secondary | ICD-10-CM

## 2016-01-18 DIAGNOSIS — B9789 Other viral agents as the cause of diseases classified elsewhere: Principal | ICD-10-CM

## 2016-01-18 MED ORDER — HYDROCOD POLST-CPM POLST ER 10-8 MG/5ML PO SUER: 5.0000 mL | Freq: Every evening | ORAL | Status: DC | PRN

## 2016-01-18 MED ORDER — METHYLPREDNISOLONE 4 MG PO TABS: ORAL_TABLET | ORAL | Status: DC

## 2016-01-18 NOTE — Patient Instructions (Signed)
Prednisone with breakfast or lunch at the latest.  6 tablets on day 1, 5 tablets on day 2, 4 tablets on day 3, 3 tablets on day 4, 2 tablets day 5, 1 tablet on day 6...done! Take tablets all together not spaced out Don't take with NSAIDs (Ibuprofen, Aleve, Naproxen, Meloxicam ect...)  5 mL (1 teaspoon) of cough syrup. Don't drive or make important decisions while taking this....just sleep and rest.

## 2016-01-18 NOTE — Progress Notes (Signed)
Pre visit review using our clinic review tool, if applicable. No additional management support is needed unless otherwise documented below in the visit note. 

## 2016-01-18 NOTE — Progress Notes (Signed)
Patient ID: Sydney [name], female    DOB: March 17, 1966  Age: 50 y.o. MRN: [id number]  CC: No chief complaint on file.   HPI [name] presents for CC of cough x 14 days.  1) Cough-productive Hoarse at first, improved  Nasal irritation  Nasal congestion  Not sleeping well  HA from coughing  Clear   Treatment to date:  [place]   Sick contacts: Denies   History Anthia has a past medical history of Hypertension.   She has no past surgical history on file.   Her family history is not on file.She reports that she has never smoked. She does not have any smokeless tobacco history on file. She reports that she does not drink alcohol or use illicit drugs.  Outpatient Prescriptions Prior to Visit  Medication Sig Dispense Refill  . Biotin 1000 MCG tablet Take 1 tablet (1 mg total) by mouth daily. 90 tablet 3  . cholecalciferol (VITAMIN D) 1000 UNITS tablet Take 1 tablet (1,000 Units total) by mouth daily. 90 tablet 3  . Diclofenac Sodium 2 % SOLN Apply 1 pump twice daily. 112 g 3  . ferrous sulfate 325 (65 FE) MG EC tablet Take 1 tablet (325 mg total) by mouth daily with breakfast. 90 tablet 3  . fish oil-omega-3 fatty acids 1000 MG capsule Take 1 g by mouth daily.    . hydrocortisone 1 % lotion Apply 1 application topically daily. 59 mL 0  . ibuprofen (ADVIL,MOTRIN) 200 MG tablet Take 200 mg by mouth daily as needed.    Marland Kitchen losartan-hydrochlorothiazide (HYZAAR) 100-25 MG tablet Take 1 tablet by mouth daily. 90 tablet 3  . pyridoxine (B-6) 200 MG tablet Take 1 tablet (200 mg total) by mouth daily. 90 tablet 2  . vitamin A 10000 UNIT capsule Take 1 capsule (10,000 Units total) by mouth daily. 90 capsule 3  . vitamin B-12 (CYANOCOBALAMIN) 1000 MCG tablet Take 1 tablet (1,000 mcg total) by mouth daily. 90 tablet 3  . Vitamins C E (VITAMIN C & E COMBINATION) 500-400 MG-UNIT CAPS Take 1 capsule daily 90 each 3   No facility-administered medications prior to visit.     ROS Review of Systems  Constitutional: Positive for fatigue. Negative for fever, chills and diaphoresis.  HENT: Positive for congestion, sore throat and voice change.        Hoarseness has improved  Respiratory: Positive for cough. Negative for chest tightness, shortness of breath and wheezing.   Cardiovascular: Negative for chest pain, palpitations and leg swelling.  Gastrointestinal: Negative for nausea, vomiting and diarrhea.  Skin: Negative for rash.  Neurological: Positive for headaches. Negative for dizziness.  Psychiatric/Behavioral: Positive for sleep disturbance.    Objective:  BP 144/98 mmHg  Pulse 59  Temp(Src) 98 F (36.7 C) (Oral)  Ht 5\' 2"  (1.575 m)  Wt 163 lb 2 oz (73.993 kg)  BMI 29.83 kg/m2  SpO2 98%  Physical Exam  Constitutional: She is oriented to person, place, and time. She appears well-developed and well-nourished. No distress.  HENT:  Head: Normocephalic and atraumatic.  Right Ear: External ear normal.  Left Ear: External ear normal.  Mouth/Throat: Oropharynx is clear and moist.  Nasal turbinates boggy  Eyes: EOM are normal. Pupils are equal, round, and reactive to light. Right eye exhibits no discharge. Left eye exhibits no discharge. No scleral icterus.  Neck: Normal range of motion. Neck supple.  Cardiovascular: Normal rate, regular rhythm and normal heart sounds.  Exam reveals no gallop and no  friction rub.   No murmur heard. Pulmonary/Chest: Effort normal and breath sounds normal. No respiratory distress. She has no wheezes. She has no rales. She exhibits no tenderness.  Lymphadenopathy:    She has no cervical adenopathy.  Neurological: She is alert and oriented to person, place, and time.  Skin: Skin is warm and dry. No rash noted. She is not diaphoretic.  Psychiatric: She has a normal mood and affect. Her behavior is normal. Judgment and thought content normal.      Assessment & Plan:   Diagnoses and all orders for this  visit:  Viral URI with cough  Other orders -     methylPREDNISolone (MEDROL) 4 MG tablet; Take 6 tablets by mouth with breakfast or lunch and decrease by 1 tablet each day until gone. -     chlorpheniramine-HYDROcodone (TUSSIONEX PENNKINETIC ER) 10-8 MG/5ML SUER; Take 5 mLs by mouth at bedtime as needed for cough.  I am having Ms. Highland start on methylPREDNISolone and chlorpheniramine-HYDROcodone. I am also having her maintain her fish oil-omega-3 fatty acids, cholecalciferol, vitamin B-12, vitamin A, VITAMIN C & E COMBINATION, Biotin, ibuprofen, pyridoxine, ferrous sulfate, hydrocortisone, Diclofenac Sodium, and losartan-hydrochlorothiazide.  Meds ordered this encounter  Medications  . methylPREDNISolone (MEDROL) 4 MG tablet    Sig: Take 6 tablets by mouth with breakfast or lunch and decrease by 1 tablet each day until gone.    Dispense:  21 tablet    Refill:  0    Order Specific Question:  Supervising Provider    Answer:  Deborra Medina L [[id number]]  . chlorpheniramine-HYDROcodone (TUSSIONEX PENNKINETIC ER) 10-8 MG/5ML SUER    Sig: Take 5 mLs by mouth at bedtime as needed for cough.    Dispense:  115 mL    Refill:  0    Order Specific Question:  Supervising Provider    Answer:  Crecencio Mc [[id number]]     Follow-up: Return if symptoms worsen or fail to improve.

## 2016-01-23 DIAGNOSIS — B9789 Other viral agents as the cause of diseases classified elsewhere: Principal | ICD-10-CM

## 2016-01-23 DIAGNOSIS — J069 Acute upper respiratory infection, unspecified: Secondary | ICD-10-CM | POA: Insufficient documentation

## 2016-01-23 NOTE — Assessment & Plan Note (Signed)
New onset Will treat conservatively due to probable viral nature Prednisone taper to decrease inflammation given  Instructions for taking done verbally and on AVS Tussionex given to patient to take to pharmacy and cautioned about drowsiness effects and how much to take FU prn worsening/failure to improve.

## 2016-01-30 ENCOUNTER — Ambulatory Visit: Payer: Federal, State, Local not specified - PPO | Admitting: Family Medicine

## 2016-02-07 ENCOUNTER — Ambulatory Visit (INDEPENDENT_AMBULATORY_CARE_PROVIDER_SITE_OTHER): Payer: Federal, State, Local not specified - PPO | Admitting: Family Medicine

## 2016-02-07 ENCOUNTER — Encounter: Payer: Self-pay | Admitting: Family Medicine

## 2016-02-07 VITALS — BP 126/82 | HR 64 | Wt 165.0 lb

## 2016-02-07 DIAGNOSIS — M9902 Segmental and somatic dysfunction of thoracic region: Secondary | ICD-10-CM

## 2016-02-07 DIAGNOSIS — M222X1 Patellofemoral disorders, right knee: Secondary | ICD-10-CM | POA: Diagnosis not present

## 2016-02-07 DIAGNOSIS — M9903 Segmental and somatic dysfunction of lumbar region: Secondary | ICD-10-CM

## 2016-02-07 DIAGNOSIS — M9904 Segmental and somatic dysfunction of sacral region: Secondary | ICD-10-CM

## 2016-02-07 DIAGNOSIS — M533 Sacrococcygeal disorders, not elsewhere classified: Secondary | ICD-10-CM

## 2016-02-07 DIAGNOSIS — M999 Biomechanical lesion, unspecified: Secondary | ICD-10-CM

## 2016-02-07 DIAGNOSIS — M222X2 Patellofemoral disorders, left knee: Secondary | ICD-10-CM

## 2016-02-07 NOTE — Assessment & Plan Note (Signed)
Patient was to start running. Given a running progression today. We discussed possible bracing. We discussed the importance of loss weight as well as proper shoes. Return in 8 weeks. If worsening symptoms possible injections.

## 2016-02-07 NOTE — Assessment & Plan Note (Signed)
Patient is making significant improvement. Encourage her to continue on the iron supple mentation at this point. We discussed icing regimen, home exercises, we discussed the importance of hip abductor's. Patient wants to start running. Concern with her knees in the patellofemoral arthritis as well as the medial compartment arthritis. Patient will get good shoes. Patient come back and see me again in 8 weeks for further evaluation and treatment.

## 2016-02-07 NOTE — Assessment & Plan Note (Signed)
Decision today to treat with OMT was based on Physical Exam  After verbal consent patient was treated with HVLA, ME techniques in the right side, lumbar and sacral areas  Patient tolerated the procedure well with improvement in symptoms  Patient given exercises, stretches and lifestyle modifications  See medications in patient instructions if given  Patient will follow up in 8 weeks

## 2016-02-07 NOTE — Progress Notes (Signed)
[name] [name], [name] [address] Phone: [phone] Subjective:    CC: Right hip pain and bilateral knee pain f/u  [id number] Sydney [name] is a 50 y.o. female coming in with complaint of Right hip pain and bilateral knee pain. Patient is having more of right sacroiliac joint dysfunction. Had been responding very well to osteopathic manipulation.  Patient takes over-the-counter natural supplementations and continues to stay active. Patient has been working out most days of the week. Continues to work on [employer], [employer]. Patient states overall she is been doing relatively well. Has numbness over the course last week little more of a stiffness.Patient states though that since she taking prednisone recently she is not having as much pain. Starting to work on a more regular basis again.  Patient is also having bilateral knee pain. Patient was having worsening symptoms at last exam. Patient elected to get x-rays. These were independently visualized by me and showed the patient has moderate to near severe arthritic changes of the medial joint space bilaterally. Patient was given some topical anti-inflammatory's, icing, home exercises and we discussed proper shoewear. Patient states Overall she seems to be doing relatively well. Not having as much pain. Recently was on prednisone for a cough. States that since she's been taking the pain is completely resolved.      Past medical history, social, surgical and family history all reviewed in electronic medical record.   Review of Systems: No headache, visual changes, nausea, vomiting, diarrhea, constipation, dizziness, abdominal pain, skin rash, fevers, chills, night sweats, weight loss, swollen lymph nodes, body aches, joint swelling, muscle aches, chest pain, shortness of breath, mood changes.   Objective Blood pressure 126/82, pulse 64, weight 165 lb (74.844 kg), SpO2 95 %.  General: No  apparent distress alert and oriented x3 mood and affect normal, dressed appropriately.  HEENT: Pupils equal, extraocular movements intact  Respiratory: Patient's speak in full sentences and does not appear short of breath  Cardiovascular: No lower extremity edema, non tender, no erythema  Skin: Warm dry intact with no signs of infection or rash on extremities or on axial skeleton.  Abdomen: Soft nontender  Neuro: Cranial nerves II through XII are intact, neurovascularly intact in all extremities with 2+ DTRs and 2+ pulses.  Lymph: No lymphadenopathy of posterior or anterior cervical chain or axillae bilaterally.  Gait normal with good balance and coordination.  MSK:  Non tender with full range of motion and good stability and symmetric strength and tone of shoulders, elbows, wrist, hip, and ankles bilaterally.  Knee: Bilateral Normal to inspection with no erythema or effusion or obvious bony abnormalities. Nontender to palpation but continued to have lateral tracking of the patellas bilaterally ROM full in flexion and extension and lower leg rotation. Ligaments with solid consistent endpoints including ACL, PCL, LCL, MCL. Negative Mcmurray's, Apley's, and Thessalonian tests. Non painful patellar compression. Patellar glide without crepitus. Patellar and quadriceps tendons unremarkable. Hamstring and quadriceps strength is normal.   Back Exam:  Inspection: Unremarkable  Motion: Flexion 45 deg, Extension 45 deg, Side Bending to 45 deg bilaterally,  Rotation to 45 deg bilaterally  SLR laying: Negative  XSLR laying: Negative  Palpable tenderness: Mild tenderness over the SI joint FABER: Positive bilaterally Sensory change: Gross sensation intact to all lumbar and sacral dermatomes.  Reflexes: 2+ at both patellar tendons, 2+ at achilles tendons, Babinski's downgoing.  Strength at foot  Plantar-flexion: 5/5 Dorsi-flexion: 5/5 Eversion: 5/5 Inversion: 5/5  Leg  strength  Quad: 5/5  Hamstring: 5/5 Hip flexor: 5/5 Hip abductors: 4+/5 but symmetric and improving Gait unremarkable. Continued improvement   Osteopathic findings Cervical C4 flexed rotated and side bent right C6 flexed rotated and side bent left  Thoracic T3 extended rotated and side bent right T 5 extended rotated and side bent right Lumbar L2 flexed rotated and side bent right  Sacrum left on left      Impression and Recommendations:     This case required medical decision making of moderate complexity.

## 2016-02-07 NOTE — Patient Instructions (Signed)
Good to see you  Ice is your friend Stay active overall  Start a walk-run progression: Run only 3 times a week. 30 minutes max - Run 2 mins, then walk 1 min. -Then run 3 mins, and walk 1 min. -Then run 4 mins, and walk 1 min. -Then run 5 mins, and walk 1 min. -Slowly build up weekly to running 30 mins nonstop.  If painful at any of the steps, back up one step. Overall you are doing great and see me again in 2 months!

## 2016-02-27 ENCOUNTER — Encounter: Payer: Federal, State, Local not specified - PPO | Admitting: Internal Medicine

## 2016-03-12 DIAGNOSIS — K080 Exfoliation of teeth due to systemic causes: Secondary | ICD-10-CM | POA: Diagnosis not present

## 2016-03-19 ENCOUNTER — Telehealth: Payer: Self-pay | Admitting: Internal Medicine

## 2016-03-19 MED ORDER — NORTRIPTYLINE HCL 10 MG PO CAPS: 10.0000 mg | ORAL_CAPSULE | Freq: Every day | ORAL | Status: DC

## 2016-03-19 NOTE — Addendum Note (Signed)
Addended by: Cassandria Anger on: 03/19/2016 05:03 PM   Modules accepted: Orders, Medications

## 2016-03-19 NOTE — Telephone Encounter (Signed)
Nortriptyline Rx emailed Thx

## 2016-03-19 NOTE — Telephone Encounter (Signed)
Pt informed

## 2016-03-19 NOTE — Telephone Encounter (Signed)
Patient states she is having trouble sleeping.  States that Dr. Alain Marion has written her scripts before for medication to help her sleep and she has refused to take them.  Now she is willing to take the medication and is requesting Dr. Alain Marion to send something to [facility] on [address].  Please follow up with patient. Patient would like a call back today if possible.  States has been trying for a week to get through on phone system and can not sleep.

## 2016-03-19 NOTE — Telephone Encounter (Signed)
FYI: Accidentally closed

## 2016-03-19 NOTE — Telephone Encounter (Signed)
Patient has called back in regards.  Would really like to know something today if possible.

## 2016-04-02 ENCOUNTER — Encounter: Payer: Self-pay | Admitting: Family Medicine

## 2016-04-02 ENCOUNTER — Ambulatory Visit (INDEPENDENT_AMBULATORY_CARE_PROVIDER_SITE_OTHER): Payer: Federal, State, Local not specified - PPO | Admitting: Family Medicine

## 2016-04-02 VITALS — BP 118/84 | HR 56 | Ht 62.0 in | Wt 162.0 lb

## 2016-04-02 DIAGNOSIS — M533 Sacrococcygeal disorders, not elsewhere classified: Secondary | ICD-10-CM | POA: Diagnosis not present

## 2016-04-02 DIAGNOSIS — M9902 Segmental and somatic dysfunction of thoracic region: Secondary | ICD-10-CM

## 2016-04-02 DIAGNOSIS — M9904 Segmental and somatic dysfunction of sacral region: Secondary | ICD-10-CM

## 2016-04-02 DIAGNOSIS — M9903 Segmental and somatic dysfunction of lumbar region: Secondary | ICD-10-CM | POA: Diagnosis not present

## 2016-04-02 DIAGNOSIS — M999 Biomechanical lesion, unspecified: Secondary | ICD-10-CM

## 2016-04-02 DIAGNOSIS — F418 Other specified anxiety disorders: Secondary | ICD-10-CM

## 2016-04-02 MED ORDER — HYDROXYZINE HCL 25 MG PO TABS
25.0000 mg | ORAL_TABLET | Freq: Three times a day (TID) | ORAL | Status: DC | PRN
Start: 2016-04-02 — End: 2017-02-13

## 2016-04-02 NOTE — Progress Notes (Signed)
Pre visit review using our clinic review tool, if applicable. No additional management support is needed unless otherwise documented below in the visit note. 

## 2016-04-02 NOTE — Progress Notes (Signed)
[name] [address] [place], [place] [address] Phone: [phone] Subjective:    CC: Right hip pain follow-up  [id number] Sydney [name] is a 50 y.o. female coming in with complaint of Right hip pain and bilateral knee pain. Patient is having more of right sacroiliac joint dysfunction. Had been responding very well to osteopathic manipulation.  Patient has been doing relatively well. Some mild increase in tightness but no significant pain. Patient continues to do well overall. Having some increasing anxiety that is contributing to some of the tightness she thinks. Continues to try to stay active and in shape. Not going to the gym as much as she would like but still 2 times a week.        Past medical history, social, surgical and family history all reviewed in electronic medical record.   Review of Systems: No headache, visual changes, nausea, vomiting, diarrhea, constipation, dizziness, abdominal pain, skin rash, fevers, chills, night sweats, weight loss, swollen lymph nodes, body aches, joint swelling, muscle aches, chest pain, shortness of breath, mood changes.   Objective Blood pressure 118/84, pulse 56, height 5\' 2"  (1.575 m), weight 162 lb (73.483 kg), SpO2 95 %.  General: No apparent distress alert and oriented x3 mood and affect normal, dressed appropriately.  HEENT: Pupils equal, extraocular movements intact  Respiratory: Patient's speak in full sentences and does not appear short of breath  Cardiovascular: No lower extremity edema, non tender, no erythema  Skin: Warm dry intact with no signs of infection or rash on extremities or on axial skeleton.  Abdomen: Soft nontender  Neuro: Cranial nerves II through XII are intact, neurovascularly intact in all extremities with 2+ DTRs and 2+ pulses.  Lymph: No lymphadenopathy of posterior or anterior cervical chain or axillae bilaterally.  Gait normal with good balance and coordination.  MSK:   Non tender with full range of motion and good stability and symmetric strength and tone of shoulders, elbows, wrist, hip, and ankles bilaterally.  Knee: Bilateral Normal to inspection with no erythema or effusion or obvious bony abnormalities. nontender on exam ROM full in flexion and extension and lower leg rotation. Ligaments with solid consistent endpoints including ACL, PCL, LCL, MCL. Negative Mcmurray's, Apley's, and Thessalonian tests. Non painful patellar compression. Patellar glide without crepitus. Patellar and quadriceps tendons unremarkable. Hamstring and quadriceps strength is normal.   Back Exam:  Inspection: Unremarkable  Motion: Flexion 45 deg, Extension 25 deg, Side Bending to 35 deg bilaterally,  Rotation to 35 deg bilaterally  SLR laying: Negative  XSLR laying: Negative  Palpable tenderness: Mild tenderness over the SI jointas well as somewhat over the paraspinal musculature of the lumbar spine.FABER: Positive bilaterally Sensory change: Gross sensation intact to all lumbar and sacral dermatomes.  Reflexes: 2+ at both patellar tendons, 2+ at achilles tendons, Babinski's downgoing.  Strength at foot  Plantar-flexion: 5/5 Dorsi-flexion: 5/5 Eversion: 5/5 Inversion: 5/5  Leg strength  Quad: 5/5 Hamstring: 5/5 Hip flexor: 5/5 Hip abductors: 4+/5 but symmetric and improving Gait unremarkable. Mild increasing tightness of the musculature from previous exam   Osteopathic findings Cervical C4 flexed rotated and side bent right C6 flexed rotated and side bent left  Thoracic T3 extended rotated and side bent right T 5 extended rotated and side bent right Lumbar L2 flexed rotated and side bent right  Sacrum left on left      Impression and Recommendations:     This case required medical decision making of moderate complexity.

## 2016-04-02 NOTE — Patient Instructions (Signed)
Good to see you  Ice is your friend Hydroxyzine up to 2 times a daywhen feeling stressed Watch the wrist but should get better soon.  Avoid impact on that side of the wrist  See me again in 4-5 weeks.

## 2016-04-02 NOTE — Assessment & Plan Note (Signed)
Continues to have difficulty overall. Mild range of motion seems to be decreased. Having more tightness. Potential some anxiety. Given name medicine to help with some of the anxiety features. We discussed continuing the topical anti-inflammatories and icing. Patient and will come back and see me again in 4-6 weeks for further evaluation and treatment.

## 2016-04-02 NOTE — Assessment & Plan Note (Signed)
Decision today to treat with OMT was based on Physical Exam  After verbal consent patient was treated with HVLA, ME techniques in the right side, lumbar and sacral areas  Patient tolerated the procedure well with improvement in symptoms  Patient given exercises, stretches and lifestyle modifications  See medications in patient instructions if given  Patient will follow up in 4-6 weeks

## 2016-05-07 ENCOUNTER — Ambulatory Visit: Payer: Federal, State, Local not specified - PPO | Admitting: Internal Medicine

## 2016-05-07 ENCOUNTER — Encounter: Payer: Self-pay | Admitting: Family Medicine

## 2016-05-07 ENCOUNTER — Ambulatory Visit (INDEPENDENT_AMBULATORY_CARE_PROVIDER_SITE_OTHER): Payer: Federal, State, Local not specified - PPO | Admitting: Family Medicine

## 2016-05-07 VITALS — BP 116/72 | HR 63 | Ht 62.0 in | Wt 164.0 lb

## 2016-05-07 DIAGNOSIS — M533 Sacrococcygeal disorders, not elsewhere classified: Secondary | ICD-10-CM | POA: Diagnosis not present

## 2016-05-07 DIAGNOSIS — M9902 Segmental and somatic dysfunction of thoracic region: Secondary | ICD-10-CM

## 2016-05-07 DIAGNOSIS — M9903 Segmental and somatic dysfunction of lumbar region: Secondary | ICD-10-CM

## 2016-05-07 DIAGNOSIS — M9904 Segmental and somatic dysfunction of sacral region: Secondary | ICD-10-CM | POA: Diagnosis not present

## 2016-05-07 DIAGNOSIS — M999 Biomechanical lesion, unspecified: Secondary | ICD-10-CM

## 2016-05-07 NOTE — Assessment & Plan Note (Signed)
Patient overall continues to be active. We discussed icing regimen and home exercises. We discussed core strengthening and hip abductors. Encourage patient to potentially try the hydroxyzine and these at night. Patient states that she has not been sleeping well. Patient will try to make these changes and come back and see me again in 6 weeks for further evaluation and treatment.

## 2016-05-07 NOTE — Patient Instructions (Signed)
Good to see you  Ice is your friend Try the hydroxyzine at night Bromelain 2500GCU with each meal can help with protein absorption.  Start your day with 2 glasses of water immediately.  Make sure you never go longer then 2 hours without eating Your diet sounds good otherwise See me again in 6 weeks.

## 2016-05-07 NOTE — Assessment & Plan Note (Signed)
Decision today to treat with OMT was based on Physical Exam  After verbal consent patient was treated with HVLA, ME techniques in the right side, lumbar and sacral areas  Patient tolerated the procedure well with improvement in symptoms  Patient given exercises, stretches and lifestyle modifications  See medications in patient instructions if given  Patient will follow up in 6 weeks        

## 2016-05-07 NOTE — Progress Notes (Signed)
[name] [address] [place], [place] [address] Phone: [phone] Subjective:    CC: Right hip pain follow-up  [id number] Sydney [name] is a 50 y.o. female coming in with complaint of Right hip pain and bilateral knee pain. Patient is having more of right sacroiliac joint dysfunction. Had been responding very well to osteopathic manipulation.  Patient has been doing relatively well. Some mild increase in tightness but no significant pain. Patient continues to do well overall. Feels anxiety has been improved. Not taking the hydroxyzine. Patient states that she has been trying to work on a more regular basis. Has no some mild tightness in the back but nothing as severe as what it was previously.  Past Medical History  Diagnosis Date  . Hypertension    No past surgical history on file. Social History   Social History  . Marital Status: Single    Spouse Name: N/A  . Number of Children: N/A  . Years of Education: N/A   Occupational History  . Not on file.   Social History Main Topics  . Smoking status: Never Smoker   . Smokeless tobacco: Not on file  . Alcohol Use: No  . Drug Use: No  . Sexual Activity: Not on file   Other Topics Concern  . Not on file   Social History Narrative   Allergies  Allergen Reactions  . Shellfish Allergy Hives    Facial urticaria  . Enalapril Maleate     REACTION: cough   No family history on file. No family history of rheumatological diseases         Past medical history, social, surgical and family history all reviewed in electronic medical record.   Review of Systems: No headache, visual changes, nausea, vomiting, diarrhea, constipation, dizziness, abdominal pain, skin rash, fevers, chills, night sweats, weight loss, swollen lymph nodes, body aches, joint swelling, muscle aches, chest pain, shortness of breath, mood changes.   Objective Blood pressure 116/72, pulse 63, height 5\' 2"  (1.575  m), weight 164 lb (74.39 kg), SpO2 97 %.  General: No apparent distress alert and oriented x3 mood and affect normal, dressed appropriately.  HEENT: Pupils equal, extraocular movements intact  Respiratory: Patient's speak in full sentences and does not appear short of breath  Cardiovascular: No lower extremity edema, non tender, no erythema  Skin: Warm dry intact with no signs of infection or rash on extremities or on axial skeleton.  Abdomen: Soft nontender  Neuro: Cranial nerves II through XII are intact, neurovascularly intact in all extremities with 2+ DTRs and 2+ pulses.  Lymph: No lymphadenopathy of posterior or anterior cervical chain or axillae bilaterally.  Gait normal with good balance and coordination.  MSK:  Non tender with full range of motion and good stability and symmetric strength and tone of shoulders, elbows, wrist, hip, and ankles bilaterally.   Back Exam:  Inspection: Unremarkable  Motion: Flexion 45 deg, Extension 25 deg, Side Bending to 35 deg bilaterally,  Rotation to 35 deg bilaterally  SLR laying: Negative  XSLR laying: Negative  Palpable tenderness: Less tenderness than previously but still more over the sacroiliac joints bilaterally FABER: Positive bilaterally Sensory change: Gross sensation intact to all lumbar and sacral dermatomes.  Reflexes: 2+ at both patellar tendons, 2+ at achilles tendons, Babinski's downgoing.  Strength at foot  Plantar-flexion: 5/5 Dorsi-flexion: 5/5 Eversion: 5/5 Inversion: 5/5  Leg strength  Quad: 5/5 Hamstring: 5/5 Hip flexor: 5/5 Hip abductors: 4+/5 but symmetric and improving  Gait unremarkable.    Osteopathic findings Cervical C4 flexed rotated and side bent right C6 flexed rotated and side bent left  Thoracic T3 extended rotated and side bent right T 5 extended rotated and side bent right Lumbar L2 flexed rotated and side bent right  Sacrum left on left      Impression and Recommendations:     This case  required medical decision making of moderate complexity.

## 2016-05-07 NOTE — Progress Notes (Signed)
Pre visit review using our clinic review tool, if applicable. No additional management support is needed unless otherwise documented below in the visit note. 

## 2016-05-09 ENCOUNTER — Ambulatory Visit (INDEPENDENT_AMBULATORY_CARE_PROVIDER_SITE_OTHER): Payer: Federal, State, Local not specified - PPO | Admitting: Internal Medicine

## 2016-05-09 ENCOUNTER — Encounter: Payer: Self-pay | Admitting: Internal Medicine

## 2016-05-09 VITALS — BP 140/96 | HR 64 | Temp 98.6°F | Wt 161.0 lb

## 2016-05-09 DIAGNOSIS — J069 Acute upper respiratory infection, unspecified: Secondary | ICD-10-CM

## 2016-05-09 DIAGNOSIS — B9789 Other viral agents as the cause of diseases classified elsewhere: Principal | ICD-10-CM

## 2016-05-09 MED ORDER — PROMETHAZINE-CODEINE 6.25-10 MG/5ML PO SYRP: 5.0000 mL | ORAL_SOLUTION | ORAL | Status: DC | PRN

## 2016-05-09 MED ORDER — AZITHROMYCIN 250 MG PO TABS: ORAL_TABLET | ORAL | Status: DC

## 2016-05-09 NOTE — Patient Instructions (Signed)
Laryngitis  Laryngitis is inflammation of your vocal cords. This causes hoarseness, coughing, loss of voice, sore throat, or a dry throat. Your vocal cords are two bands of muscles that are found in your throat. When you speak, these cords come together and vibrate. These vibrations come out through your mouth as sound. When your vocal cords are inflamed, your voice sounds different.  Laryngitis can be temporary (acute) or long-term (chronic). Most cases of acute laryngitis improve with time. Chronic laryngitis is laryngitis that lasts for more than three weeks.  CAUSES  Acute laryngitis may be caused by:   A viral infection.   Lots of talking, yelling, or singing. This is also called vocal strain.   Bacterial infections.  Chronic laryngitis may be caused by:   Vocal strain.   Injury to your vocal cords.   Acid reflux (gastroesophageal reflux disease or GERD).   Allergies.   Sinus infection.   Smoking.   Alcohol abuse.   Breathing in chemicals or dust.   Growths on the vocal cords.  RISK FACTORS  Risk factors for laryngitis include:   Smoking.   Alcohol abuse.   Having allergies.  SIGNS AND SYMPTOMS  Symptoms of laryngitis may include:   Low, hoarse voice.   Loss of voice.   Dry cough.   Sore throat.   Stuffy nose.  DIAGNOSIS  Laryngitis may be diagnosed by:   Physical exam.   Throat culture.   Blood test.   Laryngoscopy. This procedure allows your health care provider to look at your vocal cords with a mirror or viewing tube.  TREATMENT  Treatment for laryngitis depends on what is causing it. Usually, treatment involves resting your voice and using medicines to soothe your throat. However, if your laryngitis is caused by a bacterial infection, you may need to take antibiotic medicine. If your laryngitis is caused by a growth, you may need to have a procedure to remove it.  HOME CARE INSTRUCTIONS   Drink enough fluid to keep your urine clear or pale yellow.   Breathe in moist air. Use a  humidifier if you live in a dry climate.   Take medicines only as directed by your health care provider.   If you were prescribed an antibiotic medicine, finish it all even if you start to feel better.   Do not smoke cigarettes or electronic cigarettes. If you need help quitting, ask your health care provider.   Talk as little as possible. Also avoid whispering, which can cause vocal strain.   Write instead of talking. Do this until your voice is back to normal.  SEEK MEDICAL CARE IF:   You have a fever.   You have increasing pain.   You have difficulty swallowing.  SEEK IMMEDIATE MEDICAL CARE IF:   You cough up blood.   You have trouble breathing.     This information is not intended to replace advice given to you by your health care provider. Make sure you discuss any questions you have with your health care provider.     Document Released: 11/19/2005 Document Revised: 12/10/2014 Document Reviewed: 05/04/2014  Elsevier Interactive Patient Education 2016 [employer]

## 2016-05-09 NOTE — Progress Notes (Signed)
Pre visit review using our clinic review tool, if applicable. No additional management support is needed unless otherwise documented below in the visit note. 

## 2016-05-09 NOTE — Progress Notes (Signed)
Subjective:  Patient ID: Sydney [name], female    DOB: 05/06/1966  Age: 50 y.o. MRN: [id number]  CC: Cough   HPI Mykeia Wingerter presents for cough and hoarseness x 4-5 d. Not better. OTC meds not helping  Outpatient Prescriptions Prior to Visit  Medication Sig Dispense Refill  . Biotin 1000 MCG tablet Take 1 tablet (1 mg total) by mouth daily. 90 tablet 3  . cholecalciferol (VITAMIN D) 1000 UNITS tablet Take 1 tablet (1,000 Units total) by mouth daily. 90 tablet 3  . Diclofenac Sodium 2 % SOLN Apply 1 pump twice daily. 112 g 3  . ferrous sulfate 325 (65 FE) MG EC tablet Take 1 tablet (325 mg total) by mouth daily with breakfast. 90 tablet 3  . fish oil-omega-3 fatty acids 1000 MG capsule Take 1 g by mouth daily.    . hydrocortisone 1 % lotion Apply 1 application topically daily. 59 mL 0  . hydrOXYzine (ATARAX/VISTARIL) 25 MG tablet Take 1 tablet (25 mg total) by mouth 3 (three) times daily as needed. 30 tablet 1  . ibuprofen (ADVIL,MOTRIN) 200 MG tablet Take 200 mg by mouth daily as needed.    Marland Kitchen losartan-hydrochlorothiazide (HYZAAR) 100-25 MG tablet Take 1 tablet by mouth daily. 90 tablet 3  . methylPREDNISolone (MEDROL) 4 MG tablet Take 6 tablets by mouth with breakfast or lunch and decrease by 1 tablet each day until gone. 21 tablet 0  . nortriptyline (PAMELOR) 10 MG capsule Take 1 capsule (10 mg total) by mouth at bedtime. 30 capsule 5  . pyridoxine (B-6) 200 MG tablet Take 1 tablet (200 mg total) by mouth daily. 90 tablet 2  . vitamin A 10000 UNIT capsule Take 1 capsule (10,000 Units total) by mouth daily. 90 capsule 3  . vitamin B-12 (CYANOCOBALAMIN) 1000 MCG tablet Take 1 tablet (1,000 mcg total) by mouth daily. 90 tablet 3  . Vitamins C E (VITAMIN C & E COMBINATION) 500-400 MG-UNIT CAPS Take 1 capsule daily 90 each 3   No facility-administered medications prior to visit.    ROS Review of Systems  Constitutional: Negative for chills, activity change, appetite change,  fatigue and unexpected weight change.  HENT: Positive for rhinorrhea, sore throat and voice change. Negative for congestion, mouth sores and sinus pressure.   Eyes: Negative for visual disturbance.  Respiratory: Positive for cough. Negative for chest tightness, shortness of breath and wheezing.   Gastrointestinal: Negative for nausea and abdominal pain.  Genitourinary: Negative for frequency, difficulty urinating and vaginal pain.  Musculoskeletal: Negative for back pain and gait problem.  Skin: Negative for pallor and rash.  Neurological: Negative for dizziness, tremors, weakness, numbness and headaches.  Psychiatric/Behavioral: Negative for confusion and sleep disturbance.    Objective:  BP 140/96 mmHg  Pulse 64  Temp(Src) 98.6 F (37 C) (Oral)  Wt 161 lb (73.029 kg)  SpO2 96%  BP Readings from Last 3 Encounters:  05/09/16 140/96  05/07/16 116/72  04/02/16 118/84    Wt Readings from Last 3 Encounters:  05/09/16 161 lb (73.029 kg)  05/07/16 164 lb (74.39 kg)  04/02/16 162 lb (73.483 kg)    Physical Exam  Constitutional: She appears well-developed. No distress.  HENT:  Head: Normocephalic.  Right Ear: External ear normal.  Left Ear: External ear normal.  Nose: Nose normal.  Mouth/Throat: Oropharynx is clear and moist.  Eyes: Conjunctivae are normal. Pupils are equal, round, and reactive to light. Right eye exhibits no discharge. Left eye exhibits no discharge.  Neck:  Normal range of motion. Neck supple. No JVD present. No tracheal deviation present. No thyromegaly present.  Cardiovascular: Normal rate, regular rhythm and normal heart sounds.   Pulmonary/Chest: No stridor. No respiratory distress. She has no wheezes.  Abdominal: Soft. Bowel sounds are normal. She exhibits no distension and no mass. There is no tenderness. There is no rebound and no guarding.  Musculoskeletal: She exhibits no edema or tenderness.  Lymphadenopathy:    She has no cervical adenopathy.    Neurological: She displays normal reflexes. No cranial nerve deficit. She exhibits normal muscle tone. Coordination normal.  Skin: No rash noted. No erythema.  Psychiatric: She has a normal mood and affect. Her behavior is normal. Judgment and thought content normal.  hoarse eryth throat  Lab Results  Component Value Date   WBC 4.4* 03/13/2013   HGB 13.7 03/13/2013   HCT 40.1 03/13/2013   PLT 219.0 03/13/2013   GLUCOSE 93 12/28/2015   CHOL 172 10/13/2010   TRIG 35.0 10/13/2010   HDL 103.30 10/13/2010   LDLCALC 62 10/13/2010   ALT 27 03/13/2013   AST 25 03/13/2013   NA 138 12/28/2015   K 4.2 12/28/2015   CL 103 12/28/2015   CREATININE 0.78 12/28/2015   BUN 18 12/28/2015   CO2 30 12/28/2015   TSH 1.06 12/28/2015   HGBA1C 6.1 10/13/2010    Dg Knee Bilateral Standing Ap  01/03/2016  CLINICAL DATA:  History of torn meniscus on the right in 2015, now with pain and left knee soreness, no known injury. EXAM: BILATERAL KNEES STANDING - 1 VIEW COMPARISON:  There no previous studies for review. FINDINGS: Weight-bearing views of both knees reveal the bones to be adequately mineralized. On the left there is mild narrowing of the medial joint compartment. There are osteophytes arising from the articular margins of the medial femoral condyle and medial tibial plateau. There is beaking of the tibial spines. The lateral joint space appears preserved. The proximal fibula is intact. On the right there is mild narrowing of the medial joint compartment. Osteophytes arise from the articular margins of the medial femoral condyle and medial tibial plateau. There is beaking of the tibial spines. The lateral joint space is preserved. The proximal fibula is unremarkable. IMPRESSION: There is moderate osteoarthritic change centered on the medial joint compartments and the tibial spines bilaterally. No acute bony abnormality is observed. Electronically Signed   By: David  Martinique M.D.   On: 01/03/2016 14:41     Assessment & Plan:   There are no diagnoses linked to this encounter. I am having Ms. Dawdy maintain her fish oil-omega-3 fatty acids, cholecalciferol, vitamin B-12, vitamin A, VITAMIN C & E COMBINATION, Biotin, ibuprofen, pyridoxine, ferrous sulfate, hydrocortisone, Diclofenac Sodium, losartan-hydrochlorothiazide, methylPREDNISolone, nortriptyline, and hydrOXYzine.  No orders of the defined types were placed in this encounter.     Follow-up: No Follow-up on file.  Walker Kehr, MD

## 2016-05-09 NOTE — Assessment & Plan Note (Addendum)
Laryngitis - voice rest Z pac Prom/Cod syr

## 2016-06-18 ENCOUNTER — Ambulatory Visit (INDEPENDENT_AMBULATORY_CARE_PROVIDER_SITE_OTHER): Payer: Federal, State, Local not specified - PPO | Admitting: Family Medicine

## 2016-06-18 ENCOUNTER — Encounter: Payer: Self-pay | Admitting: Family Medicine

## 2016-06-18 VITALS — BP 124/78 | HR 62 | Wt 165.0 lb

## 2016-06-18 DIAGNOSIS — M9902 Segmental and somatic dysfunction of thoracic region: Secondary | ICD-10-CM

## 2016-06-18 DIAGNOSIS — M9903 Segmental and somatic dysfunction of lumbar region: Secondary | ICD-10-CM

## 2016-06-18 DIAGNOSIS — M9904 Segmental and somatic dysfunction of sacral region: Secondary | ICD-10-CM

## 2016-06-18 DIAGNOSIS — M542 Cervicalgia: Secondary | ICD-10-CM

## 2016-06-18 DIAGNOSIS — M533 Sacrococcygeal disorders, not elsewhere classified: Secondary | ICD-10-CM

## 2016-06-18 DIAGNOSIS — M999 Biomechanical lesion, unspecified: Secondary | ICD-10-CM

## 2016-06-18 MED ORDER — VENLAFAXINE HCL ER 37.5 MG PO CP24: 37.5000 mg | ORAL_CAPSULE | Freq: Every day | ORAL | Status: DC

## 2016-06-18 NOTE — Assessment & Plan Note (Signed)
More secondary to patient's anxiety. Likely more of generalized anxiety disorder than truly situational. Patient will work on home exercises, icing, and which exercises to do. We discussed different changes and seating position that can be helpful. Follow-up again in 4 weeks

## 2016-06-18 NOTE — Patient Instructions (Addendum)
It is good to see you  You are doing well  I do think the anxiety is getting you alittle bit.  Lets try effexor 37.5 mg daily and take it right when you wake up.  Continue everything else you are doing and I hope this indirectly helps with the sleep, weight and back pain  Change seat at work with coat under legs or use the foot stool.  See me again in 4 weeks.

## 2016-06-18 NOTE — Assessment & Plan Note (Signed)
Still giving her difficulty overall. Patient does have some mild increase in lordosis of the lower back as well as some muscle imbalances that is contributing. I do think that patient's daily activities as well as work can cause some exacerbation. We discussed with patient again about icing. Patient will work on the ergonomics at work. Patient was given an ergonomic chair and states it is difficult to get comfortable. We didn't give her some suggestions. In addition of this I'm hoping the nortriptyline will help with some of the nighttime pain. Patient does respond well to osteopathic manipulation and we'll see her then 4 weeks.

## 2016-06-18 NOTE — Assessment & Plan Note (Signed)
Decision today to treat with OMT was based on Physical Exam  After verbal consent patient was treated with HVLA, ME techniques in the right side, lumbar and sacral areas  Patient tolerated the procedure well with improvement in symptoms  Patient given exercises, stretches and lifestyle modifications  See medications in patient instructions if given  Patient will follow up in 4-6 weeks

## 2016-06-18 NOTE — Progress Notes (Signed)
[name] [place] [name], [place] [address] Phone: [phone] Subjective:    CC: Right hip pain follow-up  [id number] Sydney [name] is a 50 y.o. female coming in with complaint of Right hip pain and bilateral knee pain. Patient is having more of right sacroiliac joint dysfunction. Had been responding very well to osteopathic manipulation.  Seems a muscular pain seems to be coming from more on the back. States that the leg cramping has improvement with the different vitamin supplementations. Patient continues to remain active. Patient states Having some mild discomfort of the lower back. Patient states For anxiety recently. More tightness from the neck. No radiation to any of the extremities. More difficulty at night than anything else. States that the anxiety seems to be worse. Patient is taking the hydroxyzine fairly regularly and is helpful.  Past Medical History  Diagnosis Date  . Hypertension    No past surgical history on file. Social History   Social History  . Marital Status: Single    Spouse Name: N/A  . Number of Children: N/A  . Years of Education: N/A   Occupational History  . Not on file.   Social History Main Topics  . Smoking status: Never Smoker   . Smokeless tobacco: Not on file  . Alcohol Use: No  . Drug Use: No  . Sexual Activity: Not on file   Other Topics Concern  . Not on file   Social History Narrative   Allergies  Allergen Reactions  . Shellfish Allergy Hives    Facial urticaria  . Enalapril Maleate     REACTION: cough   No family history on file. No family history of rheumatological diseases     Past medical history, social, surgical and family history all reviewed in electronic medical record.   Review of Systems: No headache, visual changes, nausea, vomiting, diarrhea, constipation, dizziness, abdominal pain, skin rash, fevers, chills, night sweats, weight loss, swollen lymph nodes, body aches,  joint swelling, muscle aches, chest pain, shortness of breath, mood changes.   Objective Blood pressure 124/78, pulse 62, weight 165 lb (74.844 kg).  General: No apparent distress alert and oriented x3 mood and affect normal, dressed appropriately.  HEENT: Pupils equal, extraocular movements intact  Respiratory: Patient's speak in full sentences and does not appear short of breath  Cardiovascular: No lower extremity edema, non tender, no erythema  Skin: Warm dry intact with no signs of infection or rash on extremities or on axial skeleton.  Abdomen: Soft nontender  Neuro: Cranial nerves II through XII are intact, neurovascularly intact in all extremities with 2+ DTRs and 2+ pulses.  Lymph: No lymphadenopathy of posterior or anterior cervical chain or axillae bilaterally.  Gait normal with good balance and coordination.  MSK:  Non tender with full range of motion and good stability and symmetric strength and tone of shoulders, elbows, wrist, hip, and ankles bilaterally.   Back Exam:  Inspection: Unremarkable  Motion: Flexion 45 deg, Extension 25 deg, Side Bending to 35 deg bilaterally,  Rotation to 35 deg bilaterally  SLR laying: Negative  XSLR laying: Negative  Palpable tenderness: Less tenderness than previously but still more over the sacroiliac joints bilaterally FABER: Positive bilaterally Sensory change: Gross sensation intact to all lumbar and sacral dermatomes.  Reflexes: 2+ at both patellar tendons, 2+ at achilles tendons, Babinski's downgoing.  Strength at foot  Plantar-flexion: 5/5 Dorsi-flexion: 5/5 Eversion: 5/5 Inversion: 5/5  Leg strength  Quad: 5/5 Hamstring: 5/5 Hip  flexor: 5/5 Hip abductors: 4+/5 but symmetric and improving Gait unremarkable.    Osteopathic findings Cervical C4 flexed rotated and side bent right C7 flexed rotated and side bent left  Thoracic T3 extended rotated and side bent right T7 extended rotated and side bent right Lumbar L2 flexed  rotated and side bent right  Sacrum left on left      Impression and Recommendations:     This case required medical decision making of moderate complexity.

## 2016-06-25 ENCOUNTER — Ambulatory Visit (INDEPENDENT_AMBULATORY_CARE_PROVIDER_SITE_OTHER): Payer: Federal, State, Local not specified - PPO | Admitting: Internal Medicine

## 2016-06-25 ENCOUNTER — Encounter: Payer: Self-pay | Admitting: Internal Medicine

## 2016-06-25 ENCOUNTER — Other Ambulatory Visit (INDEPENDENT_AMBULATORY_CARE_PROVIDER_SITE_OTHER): Payer: Federal, State, Local not specified - PPO

## 2016-06-25 VITALS — BP 120/70 | HR 60 | Ht 62.0 in | Wt 164.0 lb

## 2016-06-25 DIAGNOSIS — Z0000 Encounter for general adult medical examination without abnormal findings: Secondary | ICD-10-CM

## 2016-06-25 DIAGNOSIS — F418 Other specified anxiety disorders: Secondary | ICD-10-CM

## 2016-06-25 DIAGNOSIS — Z23 Encounter for immunization: Secondary | ICD-10-CM | POA: Diagnosis not present

## 2016-06-25 DIAGNOSIS — G4700 Insomnia, unspecified: Secondary | ICD-10-CM

## 2016-06-25 LAB — URINALYSIS
Bilirubin Urine: NEGATIVE
HGB URINE DIPSTICK: NEGATIVE
KETONES UR: NEGATIVE
LEUKOCYTES UA: NEGATIVE
Nitrite: NEGATIVE
SPECIFIC GRAVITY, URINE: 1.02 (ref 1.000–1.030)
TOTAL PROTEIN, URINE-UPE24: NEGATIVE
URINE GLUCOSE: NEGATIVE
UROBILINOGEN UA: 0.2 (ref 0.0–1.0)
pH: 6 (ref 5.0–8.0)

## 2016-06-25 LAB — HEPATIC FUNCTION PANEL
ALBUMIN: 4.3 g/dL (ref 3.5–5.2)
ALT: 20 U/L (ref 0–35)
AST: 18 U/L (ref 0–37)
Alkaline Phosphatase: 82 U/L (ref 39–117)
Bilirubin, Direct: 0.1 mg/dL (ref 0.0–0.3)
TOTAL PROTEIN: 7.1 g/dL (ref 6.0–8.3)
Total Bilirubin: 0.3 mg/dL (ref 0.2–1.2)

## 2016-06-25 LAB — CBC WITH DIFFERENTIAL/PLATELET
BASOS PCT: 0.4 % (ref 0.0–3.0)
Basophils Absolute: 0 10*3/uL (ref 0.0–0.1)
EOS ABS: 0.1 10*3/uL (ref 0.0–0.7)
EOS PCT: 1.7 % (ref 0.0–5.0)
HEMATOCRIT: 39.9 % (ref 36.0–46.0)
Hemoglobin: 13.4 g/dL (ref 12.0–15.0)
LYMPHS PCT: 50.1 % — AB (ref 12.0–46.0)
Lymphs Abs: 2.1 10*3/uL (ref 0.7–4.0)
MCHC: 33.6 g/dL (ref 30.0–36.0)
MCV: 84.2 fl (ref 78.0–100.0)
Monocytes Absolute: 0.5 10*3/uL (ref 0.1–1.0)
Monocytes Relative: 11.4 % (ref 3.0–12.0)
NEUTROS ABS: 1.5 10*3/uL (ref 1.4–7.7)
Neutrophils Relative %: 36.4 % — ABNORMAL LOW (ref 43.0–77.0)
PLATELETS: 253 10*3/uL (ref 150.0–400.0)
RBC: 4.74 Mil/uL (ref 3.87–5.11)
RDW: 13.4 % (ref 11.5–15.5)
WBC: 4.2 10*3/uL (ref 4.0–10.5)

## 2016-06-25 LAB — BASIC METABOLIC PANEL
BUN: 16 mg/dL (ref 6–23)
CHLORIDE: 102 meq/L (ref 96–112)
CO2: 34 meq/L — AB (ref 19–32)
CREATININE: 0.78 mg/dL (ref 0.40–1.20)
Calcium: 9.8 mg/dL (ref 8.4–10.5)
GFR: 101.36 mL/min (ref >60.00)
Glucose, Bld: 83 mg/dL (ref 70–99)
POTASSIUM: 3.6 meq/L (ref 3.5–5.1)
Sodium: 141 mEq/L (ref 135–145)

## 2016-06-25 LAB — LIPID PANEL
CHOLESTEROL: 200 mg/dL (ref 0–200)
HDL: 83.7 mg/dL (ref >39.00)
LDL Cholesterol: 99 mg/dL (ref 0–99)
NonHDL: 116.45
TRIGLYCERIDES: 86 mg/dL (ref 0.0–149.0)
Total CHOL/HDL Ratio: 2
VLDL: 17.2 mg/dL (ref 0.0–40.0)

## 2016-06-25 LAB — TSH: TSH: 1.4 u[IU]/mL (ref 0.35–4.50)

## 2016-06-25 MED ORDER — NORTRIPTYLINE HCL 25 MG PO CAPS: 25.0000 mg | ORAL_CAPSULE | Freq: Every day | ORAL | 5 refills | Status: DC

## 2016-06-25 NOTE — Assessment & Plan Note (Signed)
Worse - will increase Nortriptyline dose if Effexor did not help

## 2016-06-25 NOTE — Addendum Note (Signed)
Addended by: Cresenciano Lick on: 06/25/2016 03:02 PM   Modules accepted: Orders

## 2016-06-25 NOTE — Progress Notes (Signed)
Subjective:  Patient ID: Sydney [name], female    DOB: [birth year]  Age: 50 y.o. MRN: [id number]  CC: Annual Exam   HPI [name] presents for a well exam C/o insomnia - not better. The pt was given Effexor Rx by Dr Tamala Julian - she has not started it yet  Outpatient Medications Prior to Visit  Medication Sig Dispense Refill  . Biotin 1000 MCG tablet Take 1 tablet (1 mg total) by mouth daily. 90 tablet 3  . cholecalciferol (VITAMIN D) 1000 UNITS tablet Take 1 tablet (1,000 Units total) by mouth daily. 90 tablet 3  . Diclofenac Sodium 2 % SOLN Apply 1 pump twice daily. 112 g 3  . ferrous sulfate 325 (65 FE) MG EC tablet Take 1 tablet (325 mg total) by mouth daily with breakfast. 90 tablet 3  . fish oil-omega-3 fatty acids 1000 MG capsule Take 1 g by mouth daily.    . hydrocortisone 1 % lotion Apply 1 application topically daily. 59 mL 0  . hydrOXYzine (ATARAX/VISTARIL) 25 MG tablet Take 1 tablet (25 mg total) by mouth 3 (three) times daily as needed. 30 tablet 1  . ibuprofen (ADVIL,MOTRIN) 200 MG tablet Take 200 mg by mouth daily as needed.    Marland Kitchen losartan-hydrochlorothiazide (HYZAAR) 100-25 MG tablet Take 1 tablet by mouth daily. 90 tablet 3  . nortriptyline (PAMELOR) 10 MG capsule Take 1 capsule (10 mg total) by mouth at bedtime. 30 capsule 5  . promethazine-codeine (PHENERGAN WITH CODEINE) 6.25-10 MG/5ML syrup Take 5 mLs by mouth every 4 (four) hours as needed. 300 mL 0  . pyridoxine (B-6) 200 MG tablet Take 1 tablet (200 mg total) by mouth daily. 90 tablet 2  . venlafaxine XR (EFFEXOR XR) 37.5 MG 24 hr capsule Take 1 capsule (37.5 mg total) by mouth daily with breakfast. 30 capsule 1  . vitamin A 10000 UNIT capsule Take 1 capsule (10,000 Units total) by mouth daily. 90 capsule 3  . vitamin B-12 (CYANOCOBALAMIN) 1000 MCG tablet Take 1 tablet (1,000 mcg total) by mouth daily. 90 tablet 3  . Vitamins C E (VITAMIN C & E COMBINATION) 500-400 MG-UNIT CAPS Take 1 capsule daily 90 each 3  .  methylPREDNISolone (MEDROL) 4 MG tablet Take 6 tablets by mouth with breakfast or lunch and decrease by 1 tablet each day until gone. 21 tablet 0  . azithromycin (ZITHROMAX) 250 MG tablet As directed (Patient not taking: Reported on 06/25/2016) 6 tablet 0   No facility-administered medications prior to visit.     ROS Review of Systems  Constitutional: Positive for fatigue. Negative for activity change, appetite change, chills and unexpected weight change.  HENT: Negative for congestion, mouth sores, rhinorrhea and sinus pressure.   Eyes: Negative for visual disturbance.  Respiratory: Negative for cough and chest tightness.   Gastrointestinal: Negative for abdominal pain and nausea.  Genitourinary: Negative for difficulty urinating, frequency and vaginal pain.  Musculoskeletal: Negative for back pain and gait problem.  Skin: Negative for pallor and rash.  Neurological: Negative for dizziness, tremors, weakness, numbness and headaches.  Psychiatric/Behavioral: Positive for sleep disturbance. Negative for confusion. The patient is nervous/anxious.     Objective:  BP 120/70   Pulse 60   Ht 5\' 2"  (1.575 m)   Wt 164 lb (74.4 kg)   SpO2 98%   BMI 30.00 kg/m   BP Readings from Last 3 Encounters:  06/25/16 120/70  06/18/16 124/78  05/09/16 (!) 140/96    Wt Readings from Last 3  Encounters:  06/25/16 164 lb (74.4 kg)  06/18/16 165 lb (74.8 kg)  05/09/16 161 lb (73 kg)    Physical Exam  Constitutional: She appears well-developed. No distress.  HENT:  Head: Normocephalic.  Right Ear: External ear normal.  Left Ear: External ear normal.  Nose: Nose normal.  Mouth/Throat: Oropharynx is clear and moist.  Eyes: Conjunctivae are normal. Pupils are equal, round, and reactive to light. Right eye exhibits no discharge. Left eye exhibits no discharge.  Neck: Normal range of motion. Neck supple. No JVD present. No tracheal deviation present. No thyromegaly present.  Cardiovascular: Normal  rate, regular rhythm and normal heart sounds.   Pulmonary/Chest: No stridor. No respiratory distress. She has no wheezes.  Abdominal: Soft. Bowel sounds are normal. She exhibits no distension and no mass. There is no tenderness. There is no rebound and no guarding.  Musculoskeletal: She exhibits no edema or tenderness.  Lymphadenopathy:    She has no cervical adenopathy.  Neurological: She displays normal reflexes. No cranial nerve deficit. She exhibits normal muscle tone. Coordination normal.  Skin: No rash noted. No erythema.  Psychiatric: She has a normal mood and affect. Her behavior is normal. Judgment and thought content normal.    Lab Results  Component Value Date   WBC 4.4 (L) 03/13/2013   HGB 13.7 03/13/2013   HCT 40.1 03/13/2013   PLT 219.0 03/13/2013   GLUCOSE 93 12/28/2015   CHOL 172 10/13/2010   TRIG 35.0 10/13/2010   HDL 103.30 10/13/2010   LDLCALC 62 10/13/2010   ALT 27 03/13/2013   AST 25 03/13/2013   NA 138 12/28/2015   K 4.2 12/28/2015   CL 103 12/28/2015   CREATININE 0.78 12/28/2015   BUN 18 12/28/2015   CO2 30 12/28/2015   TSH 1.06 12/28/2015   HGBA1C 6.1 10/13/2010    Dg Knee Bilateral Standing Ap  Result Date: 01/03/2016 CLINICAL DATA:  History of torn meniscus on the right in 2015, now with pain and left knee soreness, no known injury. EXAM: BILATERAL KNEES STANDING - 1 VIEW COMPARISON:  There no previous studies for review. FINDINGS: Weight-bearing views of both knees reveal the bones to be adequately mineralized. On the left there is mild narrowing of the medial joint compartment. There are osteophytes arising from the articular margins of the medial femoral condyle and medial tibial plateau. There is beaking of the tibial spines. The lateral joint space appears preserved. The proximal fibula is intact. On the right there is mild narrowing of the medial joint compartment. Osteophytes arise from the articular margins of the medial femoral condyle and medial  tibial plateau. There is beaking of the tibial spines. The lateral joint space is preserved. The proximal fibula is unremarkable. IMPRESSION: There is moderate osteoarthritic change centered on the medial joint compartments and the tibial spines bilaterally. No acute bony abnormality is observed. Electronically Signed   By: David  Martinique M.D.   On: 01/03/2016 14:41    Assessment & Plan:   There are no diagnoses linked to this encounter. I have discontinued Ms. Haven's methylPREDNISolone and azithromycin. I am also having her maintain her fish oil-omega-3 fatty acids, cholecalciferol, vitamin B-12, vitamin A, VITAMIN C & E COMBINATION, Biotin, ibuprofen, pyridoxine, ferrous sulfate, hydrocortisone, Diclofenac Sodium, losartan-hydrochlorothiazide, nortriptyline, hydrOXYzine, promethazine-codeine, and venlafaxine XR.  No orders of the defined types were placed in this encounter.    Follow-up: No Follow-up on file.  Walker Kehr, MD

## 2016-06-25 NOTE — Assessment & Plan Note (Signed)
We discussed age appropriate health related issues, including available/recomended screening tests and vaccinations. We discussed a need for adhering to healthy diet and exercise. Labs/EKG were reviewed/ordered. All questions were answered.   

## 2016-06-25 NOTE — Progress Notes (Signed)
Pre visit review using our clinic review tool, if applicable. No additional management support is needed unless otherwise documented below in the visit note. 

## 2016-06-28 ENCOUNTER — Telehealth: Payer: Self-pay

## 2016-06-28 NOTE — Telephone Encounter (Signed)
FMLA paperwork received completed and signed.  Original placed in cabinet for pt pick up. Copy sent to scan, Pt advised of same

## 2016-07-15 NOTE — Progress Notes (Signed)
[name] [address] [place], [place] [address] Phone: [phone] Subjective:    CC: Right hip pain follow-up  [id number]  Yolotzin Mascarenas is a 50 y.o. female coming in with complaint of  Patient is having more of right sacroiliac joint dysfunction. Had been responding very well to osteopathic manipulation.  Seems a muscular pain seems to be coming from more on the back. States he she has been doing very well with the leg cramping and is almost nonexistent. Patient did do a lot of moving recently. Patient moved to a new apartment. Had to do a lot of lifting. Some mild increasing tightness but nothing severe. Did not start the Effexor like she was doing.  Past Medical History:  Diagnosis Date  . Hypertension    No past surgical history on file. Social History   Social History  . Marital status: Single    Spouse name: N/A  . Number of children: N/A  . Years of education: N/A   Occupational History  . Not on file.   Social History Main Topics  . Smoking status: Never Smoker  . Smokeless tobacco: Not on file  . Alcohol use No  . Drug use: No  . Sexual activity: Not on file   Other Topics Concern  . Not on file   Social History Narrative  . No narrative on file   Allergies  Allergen Reactions  . Shellfish Allergy Hives    Facial urticaria  . Enalapril Maleate     REACTION: cough   No family history on file. No family history of rheumatological diseases     Past medical history, social, surgical and family history all reviewed in electronic medical record.   Review of Systems: No headache, visual changes, nausea, vomiting, diarrhea, constipation, dizziness, abdominal pain, skin rash, fevers, chills, night sweats, weight loss, swollen lymph nodes, body aches, joint swelling, muscle aches, chest pain, shortness of breath, mood changes.   Objective  Blood pressure 106/80, pulse 60, weight 163 lb (73.9 kg), SpO2 96 %.  General:  No apparent distress alert and oriented x3 mood and affect normal, dressed appropriately.  HEENT: Pupils equal, extraocular movements intact  Respiratory: Patient's speak in full sentences and does not appear short of breath  Cardiovascular: No lower extremity edema, non tender, no erythema  Skin: Warm dry intact with no signs of infection or rash on extremities or on axial skeleton.  Abdomen: Soft nontender  Neuro: Cranial nerves II through XII are intact, neurovascularly intact in all extremities with 2+ DTRs and 2+ pulses.  Lymph: No lymphadenopathy of posterior or anterior cervical chain or axillae bilaterally.  Gait normal with good balance and coordination.  MSK:  Non tender with full range of motion and good stability and symmetric strength and tone of shoulders, elbows, wrist, hip, and ankles bilaterally.   Back Exam:  Inspection: Unremarkable  Motion: Flexion 45 deg, Extension 25 deg, Side Bending to 35 deg bilaterally,  Rotation to 35 deg bilaterally  SLR laying: Negative  XSLR laying: Negative  Palpable tenderness: Mild pain in the sacroiliac bilaterally FABER: Positive bilaterally Sensory change: Gross sensation intact to all lumbar and sacral dermatomes.  Reflexes: 2+ at both patellar tendons, 2+ at achilles tendons, Babinski's downgoing.  Strength at foot  Plantar-flexion: 5/5 Dorsi-flexion: 5/5 Eversion: 5/5 Inversion: 5/5  Leg strength  Quad: 5/5 Hamstring: 5/5 Hip flexor: 5/5 Hip abductors: 4+/5 but symmetric and improving Gait unremarkable.   Osteopathic findings Cervical C4 flexed  rotated and side bent right C7 flexed rotated and side bent left  Thoracic T3 extended rotated and side bent right T8 extended rotated and side bent right Lumbar L2 flexed rotated and side bent right  Sacrum left on left      Impression and Recommendations:     This case required medical decision making of moderate complexity.

## 2016-07-16 ENCOUNTER — Encounter: Payer: Self-pay | Admitting: Family Medicine

## 2016-07-16 ENCOUNTER — Ambulatory Visit (INDEPENDENT_AMBULATORY_CARE_PROVIDER_SITE_OTHER): Payer: Federal, State, Local not specified - PPO | Admitting: Family Medicine

## 2016-07-16 VITALS — BP 106/80 | HR 60 | Wt 163.0 lb

## 2016-07-16 DIAGNOSIS — M9902 Segmental and somatic dysfunction of thoracic region: Secondary | ICD-10-CM

## 2016-07-16 DIAGNOSIS — M9903 Segmental and somatic dysfunction of lumbar region: Secondary | ICD-10-CM | POA: Diagnosis not present

## 2016-07-16 DIAGNOSIS — M533 Sacrococcygeal disorders, not elsewhere classified: Secondary | ICD-10-CM

## 2016-07-16 DIAGNOSIS — M999 Biomechanical lesion, unspecified: Secondary | ICD-10-CM

## 2016-07-16 DIAGNOSIS — M9904 Segmental and somatic dysfunction of sacral region: Secondary | ICD-10-CM

## 2016-07-16 NOTE — Assessment & Plan Note (Signed)
Continues to give her difficulty. We discussed core strength and hip abductors. Encourage patient to take Effexor. Patient continue with conservative therapy. Does respond well to osteopathic manipulation. Follow-up again in 4-6 weeks.

## 2016-07-16 NOTE — Patient Instructions (Signed)
Good to see you  Effexor 37.5 mg daily starting tomorrow Try to lift with both hands when possible See me again in 4 weeks.

## 2016-07-16 NOTE — Assessment & Plan Note (Signed)
Decision today to treat with OMT was based on Physical Exam  After verbal consent patient was treated with HVLA, ME techniques in the right side, lumbar and sacral areas  Patient tolerated the procedure well with improvement in symptoms  Patient given exercises, stretches and lifestyle modifications  See medications in patient instructions if given  Patient will follow up in 6 weeks        

## 2016-07-23 ENCOUNTER — Encounter: Payer: Self-pay | Admitting: Family

## 2016-07-23 ENCOUNTER — Ambulatory Visit (INDEPENDENT_AMBULATORY_CARE_PROVIDER_SITE_OTHER): Payer: Federal, State, Local not specified - PPO | Admitting: Family

## 2016-07-23 DIAGNOSIS — R21 Rash and other nonspecific skin eruption: Secondary | ICD-10-CM | POA: Insufficient documentation

## 2016-07-23 MED ORDER — VALACYCLOVIR HCL 1 G PO TABS: 1000.0000 mg | ORAL_TABLET | Freq: Three times a day (TID) | ORAL | 0 refills | Status: DC

## 2016-07-23 MED ORDER — TRIAMCINOLONE ACETONIDE 0.1 % EX CREA: 1.0000 "application " | TOPICAL_CREAM | Freq: Two times a day (BID) | CUTANEOUS | 0 refills | Status: DC

## 2016-07-23 NOTE — Patient Instructions (Signed)
Thank you for choosing [facility].  Summary/Instructions:  Start with the Kenalog cream. If not improvements start with Valtrex.   Your prescription(s) have been submitted to your pharmacy or been printed and provided for you. Please take as directed and contact our office if you believe you are having problem(s) with the medication(s) or have any questions.  If your symptoms worsen or fail to improve, please contact our office for further instruction, or in case of emergency go directly to the emergency room at the closest medical facility.

## 2016-07-23 NOTE — Assessment & Plan Note (Signed)
Rash appears in a dermatome pattern with concern for possible shingles. Will treat conservative with triamcinalone cream and written prescription for Valtrex provided. Follow up with dermatology if symptoms do not improve.

## 2016-07-23 NOTE — Progress Notes (Signed)
Subjective:    Patient ID: Sydney [name], female    DOB: [birth year], 50 y.o.   MRN: [id number]  Chief Complaint  Patient presents with  . Rash    starting last week has developed bumps behind ears that have went down to neck, no itching    HPI:  Sydney [name] is a 50 y.o. female who  has a past medical history of Hypertension. and presents today for an acute office visit.   This is a new problem. Associated symptom of a rash located behind her ears and has gone down to her neck has been going on for about 1 week. Modifying factors include Benedryl and hydrocortisone cream which did not seem to help very much. Course of the symptoms appears to be improving a little. This is occurring bilaterally. No changes in perfumes, chemicals, medications or body cleansing products. Denies neuropathic pain.    Allergies  Allergen Reactions  . Shellfish Allergy Hives    Facial urticaria  . Enalapril Maleate     REACTION: cough     Current Outpatient Prescriptions on File Prior to Visit  Medication Sig Dispense Refill  . Biotin 1000 MCG tablet Take 1 tablet (1 mg total) by mouth daily. 90 tablet 3  . cholecalciferol (VITAMIN D) 1000 UNITS tablet Take 1 tablet (1,000 Units total) by mouth daily. 90 tablet 3  . Diclofenac Sodium 2 % SOLN Apply 1 pump twice daily. 112 g 3  . ferrous sulfate 325 (65 FE) MG EC tablet Take 1 tablet (325 mg total) by mouth daily with breakfast. 90 tablet 3  . fish oil-omega-3 fatty acids 1000 MG capsule Take 1 g by mouth daily.    . hydrocortisone 1 % lotion Apply 1 application topically daily. 59 mL 0  . hydrOXYzine (ATARAX/VISTARIL) 25 MG tablet Take 1 tablet (25 mg total) by mouth 3 (three) times daily as needed. 30 tablet 1  . ibuprofen (ADVIL,MOTRIN) 200 MG tablet Take 200 mg by mouth daily as needed.    Marland Kitchen losartan-hydrochlorothiazide (HYZAAR) 100-25 MG tablet Take 1 tablet by mouth daily. 90 tablet 3  . nortriptyline (PAMELOR) 25 MG capsule Take 1-2  capsules (25-50 mg total) by mouth at bedtime. 60 capsule 5  . pyridoxine (B-6) 200 MG tablet Take 1 tablet (200 mg total) by mouth daily. 90 tablet 2  . venlafaxine XR (EFFEXOR XR) 37.5 MG 24 hr capsule Take 1 capsule (37.5 mg total) by mouth daily with breakfast. 30 capsule 1  . vitamin A 10000 UNIT capsule Take 1 capsule (10,000 Units total) by mouth daily. 90 capsule 3  . vitamin B-12 (CYANOCOBALAMIN) 1000 MCG tablet Take 1 tablet (1,000 mcg total) by mouth daily. 90 tablet 3  . Vitamins C E (VITAMIN C & E COMBINATION) 500-400 MG-UNIT CAPS Take 1 capsule daily 90 each 3   No current facility-administered medications on file prior to visit.     Review of Systems  Constitutional: Negative for chills and fever.  Respiratory: Negative for chest tightness and shortness of breath.   Cardiovascular: Negative for chest pain, palpitations and leg swelling.  Skin: Positive for rash.      Objective:    BP (!) 152/98 (BP Location: Left Arm, Patient Position: Sitting, Cuff Size: Normal)   Pulse (!) 58   Temp 98.3 F (36.8 C) (Oral)   Resp 18   Ht 5\' 2"  (1.575 m)   SpO2 97%  Nursing note and vital signs reviewed.  Physical Exam  Constitutional: She is  oriented to person, place, and time. She appears well-developed and well-nourished. No distress.  Neck:  Vesicular appearing rash located on the bilateral C2 dermatome. They appear to be very well define vesicles in various stages.   Cardiovascular: Normal rate, regular rhythm, normal heart sounds and intact distal pulses.   Pulmonary/Chest: Effort normal and breath sounds normal.  Neurological: She is alert and oriented to person, place, and time.  Skin: Skin is warm and dry.  Psychiatric: She has a normal mood and affect. Her behavior is normal. Judgment and thought content normal.       Assessment & Plan:   Problem List Items Addressed This Visit      Musculoskeletal and Integument   Rash and nonspecific skin eruption    Rash  appears in a dermatome pattern with concern for possible shingles. Will treat conservative with triamcinalone cream and written prescription for Valtrex provided. Follow up with dermatology if symptoms do not improve.       Relevant Medications   valACYclovir (VALTREX) 1000 MG tablet   triamcinolone cream (KENALOG) 0.1 %    Other Visit Diagnoses   None.      I am having Ms. Sonnenfeld start on valACYclovir and triamcinolone cream. I am also having her maintain her fish oil-omega-3 fatty acids, cholecalciferol, vitamin B-12, vitamin A, VITAMIN C & E COMBINATION, Biotin, ibuprofen, pyridoxine, ferrous sulfate, hydrocortisone, Diclofenac Sodium, losartan-hydrochlorothiazide, hydrOXYzine, venlafaxine XR, and nortriptyline.   Meds ordered this encounter  Medications  . valACYclovir (VALTREX) 1000 MG tablet    Sig: Take 1 tablet (1,000 mg total) by mouth 3 (three) times daily.    Dispense:  21 tablet    Refill:  0    Order Specific Question:   Supervising Provider    Answer:   Pricilla Holm A [id number]  . triamcinolone cream (KENALOG) 0.1 %    Sig: Apply 1 application topically 2 (two) times daily.    Dispense:  30 g    Refill:  0    Order Specific Question:   Supervising Provider    Answer:   Pricilla Holm A [id number]     Follow-up: Return if symptoms worsen or fail to improve.  Mauricio Po, FNP

## 2016-07-26 NOTE — Telephone Encounter (Signed)
Attempted to call pt x 3, no answer, no VM.  Updated FMLA placed in cabinet for pt pick up

## 2016-08-13 NOTE — Progress Notes (Signed)
[name] [address] [place], [place] [address] Phone: [phone] Subjective:    CC: Right hip pain follow-up  [id number]  Sydney [name] is a 50 y.o. female coming in with complaint of  Patient is having more of right sacroiliac joint dysfunction. Had been responding very well to osteopathic manipulation.  Seems a muscular pain seems to be coming from more on the back. 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 States some mild fatigue at the in the long days but that's about it. His ointment standing for one week. Patient was to take Effexor. Patient states that she is taking it and has noticed some mild improvement.  Past Medical History:  Diagnosis Date  . Hypertension    No past surgical history on file. Social History   Social History  . Marital status: Single    Spouse name: N/A  . Number of children: N/A  . Years of education: N/A   Occupational History  . Not on file.   Social History Main Topics  . Smoking status: Never Smoker  . Smokeless tobacco: Not on file  . Alcohol use No  . Drug use: No  . Sexual activity: Not on file   Other Topics Concern  . Not on file   Social History Narrative  . No narrative on file   Allergies  Allergen Reactions  . Shellfish Allergy Hives    Facial urticaria  . Enalapril Maleate     REACTION: cough   No family history on file. No family history of rheumatological diseases     Past medical history, social, surgical and family history all reviewed in electronic medical record.   Review of Systems: No headache, visual changes, nausea, vomiting, diarrhea, constipation, dizziness, abdominal pain, skin rash, fevers, chills, night sweats, weight loss, swollen lymph nodes, body aches, joint swelling, muscle aches, chest pain, shortness of breath, mood changes.   Objective  Blood pressure 112/82, pulse 68, SpO2 98 %.  General: No  apparent distress alert and oriented x3 mood and affect normal, dressed appropriately.  HEENT: Pupils equal, extraocular movements intact  Respiratory: Patient's speak in full sentences and does not appear short of breath  Cardiovascular: No lower extremity edema, non tender, no erythema  Skin: Warm dry intact with no signs of infection or rash on extremities or on axial skeleton.  Abdomen: Soft nontender  Neuro: Cranial nerves II through XII are intact, neurovascularly intact in all extremities with 2+ DTRs and 2+ pulses.  Lymph: No lymphadenopathy of posterior or anterior cervical chain or axillae bilaterally.  Gait normal with good balance and coordination.  MSK:  Non tender with full range of motion and good stability and symmetric strength and tone of shoulders, elbows, wrist, hip, and ankles bilaterally.   Back Exam:  Inspection: Unremarkable  Motion: Flexion 45 deg, Extension 25 deg, Side Bending to 35 deg bilaterally,  Rotation to 35 deg bilaterally  SLR laying: Negative  XSLR laying: Negative  Palpable tenderness: Minimal tenderness of the sacrum bilaterally FABER: Positive bilaterally Sensory change: Gross sensation intact to all lumbar and sacral dermatomes.  Reflexes: 2+ at both patellar tendons, 2+ at achilles tendons, Babinski's downgoing.  Strength at foot  Plantar-flexion: 5/5 Dorsi-flexion: 5/5 Eversion: 5/5 Inversion: 5/5  Leg strength  Quad: 5/5 Hamstring: 5/5 Hip flexor: 5/5 Hip abductors: 4+/5 but symmetric and improving Gait unremarkable. No significant change from previous exam   Osteopathic  findings Cervical C4 flexed rotated and side bent right Thoracic T3 extended rotated and side bent right T7 extended rotated and side bent right Lumbar L2 flexed rotated and side bent right  Sacrum left on left      Impression and Recommendations:     This case required medical decision making of moderate complexity.

## 2016-08-14 ENCOUNTER — Ambulatory Visit (INDEPENDENT_AMBULATORY_CARE_PROVIDER_SITE_OTHER): Payer: Federal, State, Local not specified - PPO | Admitting: Family Medicine

## 2016-08-14 ENCOUNTER — Encounter: Payer: Self-pay | Admitting: Family Medicine

## 2016-08-14 VITALS — BP 112/82 | HR 68

## 2016-08-14 DIAGNOSIS — M9902 Segmental and somatic dysfunction of thoracic region: Secondary | ICD-10-CM

## 2016-08-14 DIAGNOSIS — M9904 Segmental and somatic dysfunction of sacral region: Secondary | ICD-10-CM

## 2016-08-14 DIAGNOSIS — M9903 Segmental and somatic dysfunction of lumbar region: Secondary | ICD-10-CM

## 2016-08-14 DIAGNOSIS — M533 Sacrococcygeal disorders, not elsewhere classified: Secondary | ICD-10-CM

## 2016-08-14 DIAGNOSIS — M999 Biomechanical lesion, unspecified: Secondary | ICD-10-CM

## 2016-08-14 NOTE — Patient Instructions (Addendum)
Good to see you  Tsp of honey at night can help Stay active Add yoga 1-2 times a week.  I am impressed.  Try standing desk 2 hours at a time and work up to 4 hours in 15 minute increments.  Lets space you out again Lets say 6-8 weeks.

## 2016-08-14 NOTE — Assessment & Plan Note (Signed)
Decision today to treat with OMT was based on Physical Exam  After verbal consent patient was treated with HVLA, ME techniques in the right side, lumbar and sacral areas  Patient tolerated the procedure well with improvement in symptoms  Patient given exercises, stretches and lifestyle modifications  See medications in patient instructions if given  Patient will follow up in 6-8 weeks

## 2016-08-14 NOTE — Assessment & Plan Note (Signed)
Overall seems to be improving overall. We discussed icing regimen, home exercises which activities doing which was potentially avoid patient is doing better we will space her out to 6-8 week intervals.

## 2016-08-29 DIAGNOSIS — K080 Exfoliation of teeth due to systemic causes: Secondary | ICD-10-CM | POA: Diagnosis not present

## 2016-09-23 NOTE — Assessment & Plan Note (Signed)
Decision today to treat with OMT was based on Physical Exam  After verbal consent patient was treated with HVLA, ME techniques in the right side, lumbar and sacral areas  Patient tolerated the procedure well with improvement in symptoms  Patient given exercises, stretches and lifestyle modifications  See medications in patient instructions if given  Patient will follow up in 6-12 weeks

## 2016-09-23 NOTE — Progress Notes (Signed)
[name] [place], [place] [address] Phone: [phone] Subjective:    CC: Right hip pain follow-up  [id number]  Sydney [name] is a 50 y.o. female coming in with complaint of  Patient is having more of right sacroiliac joint dysfunction. Had been responding very well to osteopathic manipulation. Has been doing very well. Some mild increased pain more on the left upper back. Does not know exactly what she did. [place] but this 2 days ago. No radiation of the pain does seems to stay localized to the scapular region.  Past Medical History:  Diagnosis Date  . Hypertension    No past surgical history on file. Social History   Social History  . Marital status: Single    Spouse name: N/A  . Number of children: N/A  . Years of education: N/A   Occupational History  . Not on file.   Social History Main Topics  . Smoking status: Never Smoker  . Smokeless tobacco: Not on file  . Alcohol use No  . Drug use: No  . Sexual activity: Not on file   Other Topics Concern  . Not on file   Social History Narrative  . No narrative on file   Allergies  Allergen Reactions  . Shellfish Allergy Hives    Facial urticaria  . Enalapril Maleate     REACTION: cough   No family history on file. No family history of rheumatological diseases     Past medical history, social, surgical and family history all reviewed in electronic medical record.   Review of Systems: No headache, visual changes, nausea, vomiting, diarrhea, constipation, dizziness, abdominal pain, skin rash, fevers, chills, night sweats, weight loss, swollen lymph nodes, body aches, joint swelling, muscle aches, chest pain, shortness of breath, mood changes.   Objective  Blood pressure 104/72, pulse (!) 58, SpO2 99 %.  General: No apparent distress alert and oriented x3 mood and affect normal, dressed appropriately.  HEENT: Pupils equal, extraocular movements intact    Respiratory: Patient's speak in full sentences and does not appear short of breath  Cardiovascular: No lower extremity edema, non tender, no erythema  Skin: Warm dry intact with no signs of infection or rash on extremities or on axial skeleton.  Abdomen: Soft nontender  Neuro: Cranial nerves II through XII are intact, neurovascularly intact in all extremities with 2+ DTRs and 2+ pulses.  Lymph: No lymphadenopathy of posterior or anterior cervical chain or axillae bilaterally.  Gait normal with good balance and coordination.  MSK:  Non tender with full range of motion and good stability and symmetric strength and tone of shoulders, elbows, wrist, hip, and ankles bilaterally.   Back Exam:  Inspection: Unremarkable  Motion: Flexion 45 deg, Extension 15 deg, Side Bending to 35 deg bilaterally,  Rotation to 35 deg bilaterally  SLR laying: Negative  XSLR laying: Negative  Palpable tenderness: More pain over the right sacroiliac joint. FABER: Positive bilaterally Sensory change: Gross sensation intact to all lumbar and sacral dermatomes.  Reflexes: 2+ at both patellar tendons, 2+ at achilles tendons, Babinski's downgoing.  Strength at foot  Plantar-flexion: 5/5 Dorsi-flexion: 5/5 Eversion: 5/5 Inversion: 5/5  Leg strength  Quad: 5/5 Hamstring: 5/5 Hip flexor: 5/5 Hip abductors: 4+/5 but symmetric and improving Gait unremarkable. No significant change from previous exam Upper back shows the patient is moderately tender in the scapular region on the left side  Osteopathic findings Cervical C6 flexed rotated and side bent right Thoracic  T3 extended rotated and side bent right T8 extended rotated and side bent right Lumbar L2 flexed rotated and side bent right  Sacrum left on left   Impression and Recommendations:     This case required medical decision making of moderate complexity.

## 2016-09-23 NOTE — Assessment & Plan Note (Signed)
Patient has been doing fairly well overall. We discussed with patient again at great length to reach aches in the ergonomic changes she can and can't work. Patient will continue to be active. Discuss hip abductor strengthening. Patient come back and see me again in 6-12 weeks

## 2016-09-24 ENCOUNTER — Encounter: Payer: Self-pay | Admitting: Family Medicine

## 2016-09-24 ENCOUNTER — Ambulatory Visit (INDEPENDENT_AMBULATORY_CARE_PROVIDER_SITE_OTHER): Payer: Federal, State, Local not specified - PPO | Admitting: Family Medicine

## 2016-09-24 VITALS — BP 104/72 | HR 58

## 2016-09-24 DIAGNOSIS — M533 Sacrococcygeal disorders, not elsewhere classified: Secondary | ICD-10-CM

## 2016-09-24 DIAGNOSIS — M999 Biomechanical lesion, unspecified: Secondary | ICD-10-CM

## 2016-09-24 MED ORDER — GABAPENTIN 100 MG PO CAPS: 200.0000 mg | ORAL_CAPSULE | Freq: Every day | ORAL | 3 refills | Status: DC

## 2016-09-24 NOTE — Patient Instructions (Signed)
God to see you  Overall doing well Gabapentin 200mg  at night See me again in 6-8 weeks.

## 2016-10-30 ENCOUNTER — Encounter: Payer: Self-pay | Admitting: Internal Medicine

## 2016-10-30 ENCOUNTER — Ambulatory Visit (INDEPENDENT_AMBULATORY_CARE_PROVIDER_SITE_OTHER): Payer: Federal, State, Local not specified - PPO | Admitting: Internal Medicine

## 2016-10-30 DIAGNOSIS — R21 Rash and other nonspecific skin eruption: Secondary | ICD-10-CM | POA: Diagnosis not present

## 2016-10-30 NOTE — Assessment & Plan Note (Addendum)
face - ?food allergy Use Kenalog tid Claritin qd SebaMed facial and body wash

## 2016-10-30 NOTE — Progress Notes (Signed)
Pre visit review using our clinic review tool, if applicable. No additional management support is needed unless otherwise documented below in the visit note/SLS  

## 2016-10-30 NOTE — Patient Instructions (Signed)
SebaMed facial and body wash Claritin daily

## 2016-10-30 NOTE — Progress Notes (Signed)
Subjective:  Patient ID: Sydney [name], female    DOB: 04/24/1966  Age: 50 y.o. MRN: [id number]  CC: Rash (Pt c/o rash around sides and under nose x1 Mth)   HPI [name] presents for rash under nose x 2-3 wks  Outpatient Medications Prior to Visit  Medication Sig Dispense Refill  . Biotin 1000 MCG tablet Take 1 tablet (1 mg total) by mouth daily. 90 tablet 3  . cholecalciferol (VITAMIN D) 1000 UNITS tablet Take 1 tablet (1,000 Units total) by mouth daily. 90 tablet 3  . ferrous sulfate 325 (65 FE) MG EC tablet Take 1 tablet (325 mg total) by mouth daily with breakfast. 90 tablet 3  . fish oil-omega-3 fatty acids 1000 MG capsule Take 1 g by mouth daily.    . hydrocortisone 1 % lotion Apply 1 application topically daily. 59 mL 0  . hydrOXYzine (ATARAX/VISTARIL) 25 MG tablet Take 1 tablet (25 mg total) by mouth 3 (three) times daily as needed. 30 tablet 1  . ibuprofen (ADVIL,MOTRIN) 200 MG tablet Take 200 mg by mouth daily as needed.    Marland Kitchen losartan-hydrochlorothiazide (HYZAAR) 100-25 MG tablet Take 1 tablet by mouth daily. 90 tablet 3  . pyridoxine (B-6) 200 MG tablet Take 1 tablet (200 mg total) by mouth daily. 90 tablet 2  . triamcinolone cream (KENALOG) 0.1 % Apply 1 application topically 2 (two) times daily. 30 g 0  . valACYclovir (VALTREX) 1000 MG tablet Take 1 tablet (1,000 mg total) by mouth 3 (three) times daily. (Patient taking differently: Take 1,000 mg by mouth 3 (three) times daily as needed. ) 21 tablet 0  . vitamin A 10000 UNIT capsule Take 1 capsule (10,000 Units total) by mouth daily. 90 capsule 3  . vitamin B-12 (CYANOCOBALAMIN) 1000 MCG tablet Take 1 tablet (1,000 mcg total) by mouth daily. 90 tablet 3  . Vitamins C E (VITAMIN C & E COMBINATION) 500-400 MG-UNIT CAPS Take 1 capsule daily 90 each 3  . Diclofenac Sodium 2 % SOLN Apply 1 pump twice daily. 112 g 3  . gabapentin (NEURONTIN) 100 MG capsule Take 2 capsules (200 mg total) by mouth at bedtime. 60 capsule 3    . venlafaxine XR (EFFEXOR XR) 37.5 MG 24 hr capsule Take 1 capsule (37.5 mg total) by mouth daily with breakfast. 30 capsule 1   No facility-administered medications prior to visit.     ROS Review of Systems  Constitutional: Negative for chills and fever.  HENT: Negative for drooling, rhinorrhea and sinus pressure.   Musculoskeletal: Negative for arthralgias.  Skin: Positive for color change and rash. Negative for pallor and wound.    Objective:  BP 108/72 (BP Location: Left Arm, Patient Position: Sitting, Cuff Size: Large)   Pulse 70   Temp 98.6 F (37 C) (Oral)   Resp 16   Ht 5\' 2"  (1.575 m)   Wt 177 lb (80.3 kg)   LMP 05/04/2012   SpO2 98%   BMI 32.37 kg/m   BP Readings from Last 3 Encounters:  10/30/16 108/72  09/24/16 104/72  08/14/16 112/82    Wt Readings from Last 3 Encounters:  10/30/16 177 lb (80.3 kg)  07/16/16 163 lb (73.9 kg)  06/25/16 164 lb (74.4 kg)    Physical Exam  Constitutional: No distress.  HENT:  Nose: Nose normal.  Mouth/Throat: Oropharynx is clear and moist. No oropharyngeal exudate.  Eyes: Conjunctivae are normal. Pupils are equal, round, and reactive to light. Right eye exhibits no discharge. Left  eye exhibits no discharge. No scleral icterus.  Neck: No thyromegaly present.  Pulmonary/Chest: No stridor.  Skin: Rash noted.  two patches of eryth skin under the nose B  Lab Results  Component Value Date   WBC 4.2 06/25/2016   HGB 13.4 06/25/2016   HCT 39.9 06/25/2016   PLT 253.0 06/25/2016   GLUCOSE 83 06/25/2016   CHOL 200 06/25/2016   TRIG 86.0 06/25/2016   HDL 83.70 06/25/2016   LDLCALC 99 06/25/2016   ALT 20 06/25/2016   AST 18 06/25/2016   NA 141 06/25/2016   K 3.6 06/25/2016   CL 102 06/25/2016   CREATININE 0.78 06/25/2016   BUN 16 06/25/2016   CO2 34 (H) 06/25/2016   TSH 1.40 06/25/2016   HGBA1C 6.1 10/13/2010    Dg Knee Bilateral Standing Ap  Result Date: 01/03/2016 CLINICAL DATA:  History of torn meniscus on  the right in 2015, now with pain and left knee soreness, no known injury. EXAM: BILATERAL KNEES STANDING - 1 VIEW COMPARISON:  There no previous studies for review. FINDINGS: Weight-bearing views of both knees reveal the bones to be adequately mineralized. On the left there is mild narrowing of the medial joint compartment. There are osteophytes arising from the articular margins of the medial femoral condyle and medial tibial plateau. There is beaking of the tibial spines. The lateral joint space appears preserved. The proximal fibula is intact. On the right there is mild narrowing of the medial joint compartment. Osteophytes arise from the articular margins of the medial femoral condyle and medial tibial plateau. There is beaking of the tibial spines. The lateral joint space is preserved. The proximal fibula is unremarkable. IMPRESSION: There is moderate osteoarthritic change centered on the medial joint compartments and the tibial spines bilaterally. No acute bony abnormality is observed. Electronically Signed   By: David  Martinique M.D.   On: 01/03/2016 14:41    Assessment & Plan:   There are no diagnoses linked to this encounter. I have discontinued Ms. Bartnik's Diclofenac Sodium, venlafaxine XR, and gabapentin. I am also having her maintain her fish oil-omega-3 fatty acids, cholecalciferol, vitamin B-12, vitamin A, VITAMIN C & E COMBINATION, Biotin, ibuprofen, pyridoxine, ferrous sulfate, hydrocortisone, losartan-hydrochlorothiazide, hydrOXYzine, valACYclovir, and triamcinolone cream.  No orders of the defined types were placed in this encounter.    Follow-up: No Follow-up on file.  Walker Kehr, MD

## 2016-11-04 NOTE — Progress Notes (Signed)
[name] [address] [place], [place] [address] Phone: [phone] Subjective:    CC: Right hip pain follow-up  [id number]  Sydney [name] is a 50 y.o. female coming in with complaint of  Patient is having more of right sacroiliac joint dysfunction. Had been responding very well to osteopathic manipulation. .Patient continues to do well overall. Some mild tightness of the upper back. Patient is not having significant pain otherwise. Patient was on Effexor for quite some time but is no longer taking it regularly.  Past Medical History:  Diagnosis Date  . Hypertension    No past surgical history on file. Social History   Social History  . Marital status: Single    Spouse name: N/A  . Number of children: N/A  . Years of education: N/A   Occupational History  . Not on file.   Social History Main Topics  . Smoking status: Never Smoker  . Smokeless tobacco: Not on file  . Alcohol use No  . Drug use: No  . Sexual activity: Not on file   Other Topics Concern  . Not on file   Social History Narrative  . No narrative on file   Allergies  Allergen Reactions  . Shellfish Allergy Hives    Facial urticaria  . Enalapril Maleate     REACTION: cough   No family history on file. No family history of rheumatological diseases     Past medical history, social, surgical and family history all reviewed in electronic medical record.   Review of Systems: No headache, visual changes, nausea, vomiting, diarrhea, constipation, dizziness, abdominal pain, skin rash, fevers, chills, night sweats, weight loss, swollen lymph nodes, body aches, joint swelling, muscle aches, chest pain, shortness of breath, mood changes.   Objective  Blood pressure 138/90, pulse 62, weight 173 lb 6.4 oz (78.7 kg), last menstrual period 05/04/2012.  Systems examined below as of 11/05/16 General: NAD A&O x3 mood, affect normal  HEENT: Pupils equal, extraocular movements  intact no nystagmus Respiratory: not short of breath at rest or with speaking Cardiovascular: No lower extremity edema, non tender Skin: Warm dry intact with no signs of infection or rash on extremities or on axial skeleton. Abdomen: Soft nontender, no masses Neuro: Cranial nerves  intact, neurovascularly intact in all extremities with 2+ DTRs and 2+ pulses. Lymph: No lymphadenopathy appreciated today  Gait normal with good balance and coordination.  MSK: Non tender with full range of motion and good stability and symmetric strength and tone of shoulders, elbows, wrist,  knee hips and ankles bilaterally.  .   Back Exam:  Inspection: Unremarkable  Motion: Flexion 45 deg, Extension 25 deg, Side Bending to 35 deg bilaterally,  Rotation to 45 deg bilaterally  SLR laying: Negative  XSLR laying: Negative  Palpable tenderness: Only mild discomfort of the right securely joint FABER: Positive bilaterally right greater than left Sensory change: Gross sensation intact to all lumbar and sacral dermatomes.  Reflexes: 2+ at both patellar tendons, 2+ at achilles tendons, Babinski's downgoing.  Strength at foot  Plantar-flexion: 5/5 Dorsi-flexion: 5/5 Eversion: 5/5 Inversion: 5/5  Leg strength  Quad: 5/5 Hamstring: 5/5 Hip flexor: 5/5 Hip abductors: 4+/5 but symmetric and stable from previous exam Gait unremarkable. No significant change from previous exam   Osteopathic findings Cervical C5 flexed rotated and side bent right Thoracic T3 extended rotated and side bent right T9 extended rotated and side bent right Lumbar L2 flexed rotated and side bent right  Sacrum right on right   Impression and Recommendations:     This case required medical decision making of moderate complexity.

## 2016-11-05 ENCOUNTER — Encounter: Payer: Self-pay | Admitting: Family Medicine

## 2016-11-05 ENCOUNTER — Ambulatory Visit (INDEPENDENT_AMBULATORY_CARE_PROVIDER_SITE_OTHER): Payer: Federal, State, Local not specified - PPO | Admitting: Family Medicine

## 2016-11-05 VITALS — BP 138/90 | HR 62 | Wt 173.4 lb

## 2016-11-05 DIAGNOSIS — M533 Sacrococcygeal disorders, not elsewhere classified: Secondary | ICD-10-CM | POA: Diagnosis not present

## 2016-11-05 DIAGNOSIS — M999 Biomechanical lesion, unspecified: Secondary | ICD-10-CM | POA: Diagnosis not present

## 2016-11-05 NOTE — Assessment & Plan Note (Signed)
Decision today to treat with OMT was based on Physical Exam  After verbal consent patient was treated with HVLA, ME techniques in the right side, lumbar and sacral areas  Patient tolerated the procedure well with improvement in symptoms  Patient given exercises, stretches and lifestyle modifications  See medications in patient instructions if given  Patient will follow up in 3 months

## 2016-11-05 NOTE — Assessment & Plan Note (Signed)
Seems to be doing relatively well. We did discuss different exercises that I think would be beneficial. Patient continued to be active otherwise. Patient will see me again in 3 months for further evaluation. Encourage her to continue to stay active.

## 2016-11-05 NOTE — Patient Instructions (Addendum)
Good to see you  Happy holidays!  Ice is your friend Lets watch the weight but I think you will do well.  STay active and pick up on e of the exercises we discussed On wall with heels, butt shoulder and head touching for a goal of 5 minutes daily  See me again in 3 months! Happy holidays!

## 2016-11-15 ENCOUNTER — Other Ambulatory Visit: Payer: Self-pay | Admitting: *Deleted

## 2016-11-15 MED ORDER — LORATADINE 10 MG PO TABS: 10.0000 mg | ORAL_TABLET | Freq: Every day | ORAL | 0 refills | Status: DC

## 2016-11-15 NOTE — Telephone Encounter (Signed)
Pt left msg on triage stating she is needing rx for Claritin sent to [facility] so she can be able to use her flex spending card. Called pt back inform rx has been sent to pharmacy...Johny Chess

## 2016-11-30 ENCOUNTER — Ambulatory Visit: Payer: Federal, State, Local not specified - PPO | Admitting: Family Medicine

## 2016-12-14 ENCOUNTER — Telehealth: Payer: Self-pay | Admitting: Internal Medicine

## 2016-12-14 NOTE — Telephone Encounter (Signed)
Pt called requesting 3 referral from Dr. Camila Li.  1. Dermatology due to break out on her face--request this be female and in network with her insurance  2. Cognitive behavior therapy---Dr. Pablo Ledger is NOT in network with her insurance--please make sure they are in network  3. Dietary due to difficulty weight loss---please make sure they are in network.  Please advise, request all her appt to be Monday.

## 2016-12-19 NOTE — Telephone Encounter (Signed)
pls forward to our ref coordinators Thx

## 2016-12-21 NOTE — Telephone Encounter (Signed)
Informed pt to contact insurance. Gave her my number if she needs anything else.

## 2016-12-21 NOTE — Telephone Encounter (Signed)
Pt vm is not set up. Unable to leave msg.  Will try again later

## 2017-01-21 ENCOUNTER — Other Ambulatory Visit: Payer: Self-pay | Admitting: *Deleted

## 2017-01-21 MED ORDER — LOSARTAN POTASSIUM-HCTZ 100-25 MG PO TABS: 1.0000 | ORAL_TABLET | Freq: Every day | ORAL | 1 refills | Status: DC

## 2017-01-28 DIAGNOSIS — F411 Generalized anxiety disorder: Secondary | ICD-10-CM | POA: Diagnosis not present

## 2017-01-29 DIAGNOSIS — K080 Exfoliation of teeth due to systemic causes: Secondary | ICD-10-CM | POA: Diagnosis not present

## 2017-02-02 NOTE — Progress Notes (Signed)
[name] [address] [place], [place] [address] Phone: [phone] Subjective:    CC: Right hip pain follow-up  [id number]  Sydney [name] is a 51 y.o. female coming in with complaint of  Patient is having more of right sacroiliac joint dysfunction. Had been responding very well to osteopathic manipulation. Noticed some mild increasing pain on the left side. This seems to be new. States that it seems to be tighter. Points more to the thoracolumbar juncture. Denies any numbness or tingling. Rates the severity of pain a 7 out of 10. No radiation down the legs. Seems to be somewhat different than previously.  Past Medical History:  Diagnosis Date  . Hypertension    No past surgical history on file. Social History   Social History  . Marital status: Single    Spouse name: N/A  . Number of children: N/A  . Years of education: N/A   Occupational History  . Not on file.   Social History Main Topics  . Smoking status: Never Smoker  . Smokeless tobacco: Not on file  . Alcohol use No  . Drug use: No  . Sexual activity: Not on file   Other Topics Concern  . Not on file   Social History Narrative  . No narrative on file   Allergies  Allergen Reactions  . Shellfish Allergy Hives    Facial urticaria  . Enalapril Maleate     REACTION: cough   No family history on file. No family history of rheumatological diseases     Past medical history, social, surgical and family history all reviewed in electronic medical record.   Review of Systems: No headache, visual changes, nausea, vomiting, diarrhea, constipation, dizziness, abdominal pain, skin rash, fevers, chills, night sweats, weight loss, swollen lymph nodes, body aches, joint swelling, muscle aches, chest pain, shortness of breath, mood changes.    Objective  Blood pressure (!) 140/98, pulse 64, height 5\' 2"  (1.575 m), weight 176 lb (79.8 kg), last menstrual period 05/04/2012.    Systems examined below as of 02/04/17 General: NAD A&O x3 mood, affect normal  HEENT: Pupils equal, extraocular movements intact no nystagmus Respiratory: not short of breath at rest or with speaking Cardiovascular: No lower extremity edema, non tender Skin: Warm dry intact with no signs of infection or rash on extremities or on axial skeleton. Abdomen: Soft nontender, no masses Neuro: Cranial nerves  intact, neurovascularly intact in all extremities with 2+ DTRs and 2+ pulses. Lymph: No lymphadenopathy appreciated today  Gait normal with good balance and coordination.  MSK: Non tender with full range of motion and good stability and symmetric strength and tone of shoulders, elbows, wrist,  knee hips and ankles bilaterally.   .   Back Exam:  Inspection: Unremarkable  Motion: Flexion 45 deg, Extension 20 deg, Side Bending to 35 deg bilaterally,  Rotation to 45 deg bilaterally  SLR laying: Negative  XSLR laying: Negative  Palpable tenderness: Increasing tenderness in the paraspinal musculature of the lumbar spine right greater left. Mild increasing tenderness over the brachial lumbar juncture on the left side as well. FABER: Positive more on the left side which is different than previous exam Sensory change: Gross sensation intact to all lumbar and sacral dermatomes.  Reflexes: 2+ at both patellar tendons, 2+ at achilles tendons, Babinski's downgoing.  Strength at foot  Plantar-flexion: 5/5 Dorsi-flexion: 5/5 Eversion: 5/5 Inversion: 5/5  Leg strength  Quad: 5/5 Hamstring: 5/5 Hip flexor: 5/5 Hip abductors: 4+/5  but symmetric and stable from previous exam Gait unremarkable. Mild worsening from previous exam   Osteopathic findings Cervical C4 flexed rotated and side bent left T3 extended rotated and side bent right inhaled third rib T7 extended rotated and side bent left L1 flexed rotated and side bent left  Sacrum right on right    Impression and Recommendations:     This  case required medical decision making of moderate complexity.

## 2017-02-04 ENCOUNTER — Ambulatory Visit (INDEPENDENT_AMBULATORY_CARE_PROVIDER_SITE_OTHER): Payer: Federal, State, Local not specified - PPO | Admitting: Family

## 2017-02-04 ENCOUNTER — Ambulatory Visit (INDEPENDENT_AMBULATORY_CARE_PROVIDER_SITE_OTHER): Payer: Federal, State, Local not specified - PPO | Admitting: Family Medicine

## 2017-02-04 VITALS — BP 140/98 | HR 64 | Ht 62.0 in | Wt 176.0 lb

## 2017-02-04 VITALS — BP 142/100 | HR 51 | Temp 98.2°F | Resp 16 | Ht 62.0 in | Wt 176.0 lb

## 2017-02-04 DIAGNOSIS — M999 Biomechanical lesion, unspecified: Secondary | ICD-10-CM | POA: Diagnosis not present

## 2017-02-04 DIAGNOSIS — M533 Sacrococcygeal disorders, not elsewhere classified: Secondary | ICD-10-CM | POA: Diagnosis not present

## 2017-02-04 DIAGNOSIS — R059 Cough, unspecified: Secondary | ICD-10-CM

## 2017-02-04 DIAGNOSIS — R05 Cough: Secondary | ICD-10-CM

## 2017-02-04 NOTE — Assessment & Plan Note (Signed)
Symptoms and exam with only mild hypertrophy of bilateral turbinates. Symptoms are most likely consistent with reflux or allergies favor reflux. Start over-the-counter proton pump inhibitor 1 week. If symptoms worsen or do not improve consider second-generation antihistamine and nasal corticosteroid. Follow-up if symptoms worsen or do not improve.

## 2017-02-04 NOTE — Patient Instructions (Signed)
Good to see you Sydney [name] is your friend.  Stay active.  For the hands put a rubber band around fingers and open hands repetitively  Also a stress ball can be good Try not to put a lot of pressure on the thumb itself See me again in 8 weeks!

## 2017-02-04 NOTE — Assessment & Plan Note (Signed)
Continues to be irritable at this time. We discussed icing regimen and home exercises. We discussed which activities doing which ones to avoid. Encourage patient to increase activity as tolerated. Follow-up again in 6-8 weeks. Overall is making improvement.

## 2017-02-04 NOTE — Assessment & Plan Note (Signed)
Decision today to treat with OMT was based on Physical Exam  After verbal consent patient was treated with HVLA, ME, FPR techniques in cervical, thoracic, lumbar and sacral areas  Patient tolerated the procedure well with improvement in symptoms  Patient given exercises, stretches and lifestyle modifications  See medications in patient instructions if given  Patient will follow up in 6-8 weeks 

## 2017-02-04 NOTE — Patient Instructions (Addendum)
Thank you for choosing [facility].  SUMMARY AND INSTRUCTIONS:  Recommend treating for potential allergies or reflux.  For allergies: Claritin, Allegra, Zyrtec, or Xyzal with Flonase to help with symptom reduction.  For reflux: Omeprazole, lansoprazole or esomeprazole for about 1 week. May discontinue if symptoms resolve or restart if symptoms return.    Follow up:  If your symptoms worsen or fail to improve, please contact our office for further instruction, or in case of emergency go directly to the emergency room at the closest medical facility.    Food Choices for Gastroesophageal Reflux Disease, Adult When you have gastroesophageal reflux disease (GERD), the foods you eat and your eating habits are very important. Choosing the right foods can help ease your discomfort. What guidelines do I need to follow?  Choose fruits, vegetables, whole grains, and low-fat dairy products.  Choose low-fat meat, fish, and poultry.  Limit fats such as oils, salad dressings, butter, nuts, and avocado.  Keep a food diary. This helps you identify foods that cause symptoms.  Avoid foods that cause symptoms. These may be different for everyone.  Eat small meals often instead of 3 large meals a day.  Eat your meals slowly, in a place where you are relaxed.  Limit fried foods.  Cook foods using methods other than frying.  Avoid drinking alcohol.  Avoid drinking large amounts of liquids with your meals.  Avoid bending over or lying down until 2-3 hours after eating. What foods are not recommended? These are some foods and drinks that may make your symptoms worse: Vegetables  Tomatoes. Tomato juice. Tomato and spaghetti sauce. Chili peppers. Onion and garlic. Horseradish. Fruits  Oranges, grapefruit, and lemon (fruit and juice). Meats  High-fat meats, fish, and poultry. This includes hot dogs, ribs, ham, sausage, salami, and bacon. Dairy  Whole milk and chocolate milk. Sour  cream. Cream. Butter. Ice cream. Cream cheese. Drinks  Coffee and tea. Bubbly (carbonated) drinks or energy drinks. Condiments  Hot sauce. Barbecue sauce. Sweets/Desserts  Chocolate and cocoa. Donuts. Peppermint and spearmint. Fats and Oils  High-fat foods. This includes [place] fries and potato chips. Other  Vinegar. Strong spices. This includes black pepper, white pepper, red pepper, cayenne, curry powder, cloves, ginger, and chili powder. The items listed above may not be a complete list of foods and drinks to avoid. Contact your dietitian for more information.  This information is not intended to replace advice given to you by your health care provider. Make sure you discuss any questions you have with your health care provider. Document Released: 05/20/2012 Document Revised: 04/26/2016 Document Reviewed: 09/23/2013 Elsevier Interactive Patient Education  2017 [employer].

## 2017-02-04 NOTE — Progress Notes (Signed)
Subjective:    Patient ID: Sydney [name], female    DOB: 1966/07/24, 51 y.o.   MRN: [id number]  Chief Complaint  Patient presents with  . Cough    thinks she has issues with allergies, voice has been going in and out, cough, x2 weeks     HPI:  Sydney [name] is a 51 y.o. female who  has a past medical history of Hypertension. and presents today for an acute office visit.  This is a new problem. Associated symptoms cough and voice fluctuations have been going on for 2 weeks that generally waxes and wanes. No fevers. Cough is non-productive. Denies any modifying factors or attempted treatments. Denies any heartburn sensations. Course of the symptoms has stayed about the same. No recent antibiotics. Eating and drinking well.   Allergies  Allergen Reactions  . Shellfish Allergy Hives    Facial urticaria  . Enalapril Maleate     REACTION: cough      Outpatient Medications Prior to Visit  Medication Sig Dispense Refill  . Biotin 1000 MCG tablet Take 1 tablet (1 mg total) by mouth daily. 90 tablet 3  . cholecalciferol (VITAMIN D) 1000 UNITS tablet Take 1 tablet (1,000 Units total) by mouth daily. 90 tablet 3  . ferrous sulfate 325 (65 FE) MG EC tablet Take 1 tablet (325 mg total) by mouth daily with breakfast. 90 tablet 3  . fish oil-omega-3 fatty acids 1000 MG capsule Take 1 g by mouth daily.    . hydrocortisone 1 % lotion Apply 1 application topically daily. 59 mL 0  . hydrOXYzine (ATARAX/VISTARIL) 25 MG tablet Take 1 tablet (25 mg total) by mouth 3 (three) times daily as needed. 30 tablet 1  . ibuprofen (ADVIL,MOTRIN) 200 MG tablet Take 200 mg by mouth daily as needed.    . loratadine (CLARITIN) 10 MG tablet Take 1 tablet (10 mg total) by mouth daily. 90 tablet 0  . losartan-hydrochlorothiazide (HYZAAR) 100-25 MG tablet Take 1 tablet by mouth daily. 90 tablet 1  . pyridoxine (B-6) 200 MG tablet Take 1 tablet (200 mg total) by mouth daily. 90 tablet 2  . triamcinolone cream  (KENALOG) 0.1 % Apply 1 application topically 2 (two) times daily. 30 g 0  . valACYclovir (VALTREX) 1000 MG tablet Take 1 tablet (1,000 mg total) by mouth 3 (three) times daily. (Patient taking differently: Take 1,000 mg by mouth 3 (three) times daily as needed. ) 21 tablet 0  . vitamin A 10000 UNIT capsule Take 1 capsule (10,000 Units total) by mouth daily. 90 capsule 3  . vitamin B-12 (CYANOCOBALAMIN) 1000 MCG tablet Take 1 tablet (1,000 mcg total) by mouth daily. 90 tablet 3  . Vitamins C E (VITAMIN C & E COMBINATION) 500-400 MG-UNIT CAPS Take 1 capsule daily 90 each 3   No facility-administered medications prior to visit.      Review of Systems  Constitutional: Negative for chills and fever.  HENT: Positive for voice change. Negative for congestion, sinus pain, sinus pressure and sore throat.   Respiratory: Positive for cough. Negative for shortness of breath.   Neurological: Negative for headaches.      Objective:    BP (!) 142/100 (BP Location: Left Arm, Patient Position: Sitting, Cuff Size: Normal)   Pulse (!) 51   Temp 98.2 F (36.8 C) (Oral)   Resp 16   Ht 5\' 2"  (1.575 m)   Wt 176 lb (79.8 kg)   LMP 05/04/2012   SpO2 98%   BMI  32.19 kg/m  Nursing note and vital signs reviewed.  Physical Exam  Constitutional: She is oriented to person, place, and time. She appears well-developed and well-nourished. No distress.  HENT:  Right Ear: Hearing, tympanic membrane, external ear and ear canal normal.  Left Ear: Hearing, tympanic membrane, external ear and ear canal normal.  Nose: Nose normal. Right sinus exhibits no maxillary sinus tenderness and no frontal sinus tenderness. Left sinus exhibits no maxillary sinus tenderness and no frontal sinus tenderness.  Mouth/Throat: Uvula is midline, oropharynx is clear and moist and mucous membranes are normal.  Mild bilateral nasal turbinate redness.   Cardiovascular: Normal rate, regular rhythm, normal heart sounds and intact distal  pulses.   Pulmonary/Chest: Effort normal and breath sounds normal.  Neurological: She is alert and oriented to person, place, and time.  Skin: Skin is warm and dry.  Psychiatric: She has a normal mood and affect. Her behavior is normal. Judgment and thought content normal.       Assessment & Plan:   Problem List Items Addressed This Visit      Other   Cough - Primary    Symptoms and exam with only mild hypertrophy of bilateral turbinates. Symptoms are most likely consistent with reflux or allergies favor reflux. Start over-the-counter proton pump inhibitor 1 week. If symptoms worsen or do not improve consider second-generation antihistamine and nasal corticosteroid. Follow-up if symptoms worsen or do not improve.          I am having Ms. Krenz maintain her fish oil-omega-3 fatty acids, cholecalciferol, vitamin B-12, vitamin A, VITAMIN C & E COMBINATION, Biotin, ibuprofen, pyridoxine, ferrous sulfate, hydrocortisone, hydrOXYzine, valACYclovir, triamcinolone cream, loratadine, and losartan-hydrochlorothiazide.   Follow-up: Return if symptoms worsen or fail to improve.  Mauricio Po, FNP

## 2017-02-06 DIAGNOSIS — F411 Generalized anxiety disorder: Secondary | ICD-10-CM | POA: Diagnosis not present

## 2017-02-13 ENCOUNTER — Telehealth: Payer: Self-pay | Admitting: Internal Medicine

## 2017-02-13 DIAGNOSIS — F411 Generalized anxiety disorder: Secondary | ICD-10-CM | POA: Diagnosis not present

## 2017-02-13 MED ORDER — ZALEPLON 5 MG PO CAPS: 5.0000 mg | ORAL_CAPSULE | Freq: Every evening | ORAL | 1 refills | Status: DC | PRN

## 2017-02-13 NOTE — Telephone Encounter (Addendum)
  OK Sonata - pls call in Pls warn about a habit forming potential w/Sonata Thx     Routing to dr plotnikov, please advise, thanks

## 2017-02-13 NOTE — Telephone Encounter (Signed)
The pt called and said that her therapist, Hosie Spangle (under: Terrance Mass) told her that it may be beneficial to change her sleep aid that she is on. She is still having issues sleeping and Anderson Malta suggested [employer]. The pt wanted to know if Dr Camila Li could prescribe this for her. Please advise. Thanks [employer]

## 2017-02-14 ENCOUNTER — Other Ambulatory Visit: Payer: Self-pay

## 2017-02-14 MED ORDER — SONATA 5 MG PO CAPS: 5.0000 mg | ORAL_CAPSULE | Freq: Every evening | ORAL | 1 refills | Status: DC | PRN

## 2017-02-14 NOTE — Telephone Encounter (Signed)
Patient called back, I have thoroughly explained this medication and all side effects/recommendations---patient understands all side effects and caution warnings, I am [facility] per patient request

## 2017-02-14 NOTE — Telephone Encounter (Signed)
I have tried to contact patient, I need to discuss all side effects with her before I send rx in----patient does not have mailbox set up yet, no way to leave message---I will continue trying to call patient, if she calls back, can talk with tamara

## 2017-02-20 DIAGNOSIS — F411 Generalized anxiety disorder: Secondary | ICD-10-CM | POA: Diagnosis not present

## 2017-02-25 DIAGNOSIS — Z713 Dietary counseling and surveillance: Secondary | ICD-10-CM | POA: Diagnosis not present

## 2017-02-26 DIAGNOSIS — L710 Perioral dermatitis: Secondary | ICD-10-CM | POA: Diagnosis not present

## 2017-02-27 DIAGNOSIS — F411 Generalized anxiety disorder: Secondary | ICD-10-CM | POA: Diagnosis not present

## 2017-03-04 DIAGNOSIS — Z1231 Encounter for screening mammogram for malignant neoplasm of breast: Secondary | ICD-10-CM | POA: Diagnosis not present

## 2017-03-04 DIAGNOSIS — Z01419 Encounter for gynecological examination (general) (routine) without abnormal findings: Secondary | ICD-10-CM | POA: Diagnosis not present

## 2017-03-04 DIAGNOSIS — Z6832 Body mass index (BMI) 32.0-32.9, adult: Secondary | ICD-10-CM | POA: Diagnosis not present

## 2017-03-07 DIAGNOSIS — F411 Generalized anxiety disorder: Secondary | ICD-10-CM | POA: Diagnosis not present

## 2017-03-14 ENCOUNTER — Telehealth: Payer: Self-pay | Admitting: Internal Medicine

## 2017-03-14 DIAGNOSIS — F411 Generalized anxiety disorder: Secondary | ICD-10-CM | POA: Diagnosis not present

## 2017-03-14 NOTE — Telephone Encounter (Signed)
Patient states Dr. Alain Marion referred her to Dr. Elsworth Soho for sleep and then Dr. Elsworth Soho referred her to Dr. Wynn Maudlin office with Hosie Spangle.  States Anderson Malta wanted her to have a cortisol test.  Would like to know if Dr. Alain Marion could order this.

## 2017-03-14 NOTE — Telephone Encounter (Signed)
Ok serum cortisol Thx

## 2017-03-14 NOTE — Telephone Encounter (Signed)
Routing to dr plotnikov, please advise, thanks 

## 2017-03-15 ENCOUNTER — Telehealth: Payer: Self-pay

## 2017-03-15 DIAGNOSIS — G4730 Sleep apnea, unspecified: Secondary | ICD-10-CM

## 2017-03-15 NOTE — Telephone Encounter (Signed)
error 

## 2017-03-15 NOTE — Telephone Encounter (Signed)
Order entered--patient given directions for coming to lab

## 2017-03-18 ENCOUNTER — Other Ambulatory Visit: Payer: Federal, State, Local not specified - PPO

## 2017-03-18 DIAGNOSIS — G4730 Sleep apnea, unspecified: Secondary | ICD-10-CM

## 2017-03-20 DIAGNOSIS — F411 Generalized anxiety disorder: Secondary | ICD-10-CM | POA: Diagnosis not present

## 2017-03-26 LAB — CORTISOL, FREE: CORTISOL FREE SERUM: 0.15 ug/dL

## 2017-03-27 ENCOUNTER — Telehealth: Payer: Self-pay | Admitting: Internal Medicine

## 2017-03-27 DIAGNOSIS — F411 Generalized anxiety disorder: Secondary | ICD-10-CM | POA: Diagnosis not present

## 2017-03-27 NOTE — Telephone Encounter (Signed)
Routing to dr plotnikov, can you please tell me what to advise patient about test results, I will call patient back, thanks

## 2017-03-27 NOTE — Telephone Encounter (Signed)
It was OK Thx

## 2017-03-27 NOTE — Telephone Encounter (Signed)
Pt would like results form Cortisol 4/16

## 2017-03-28 NOTE — Telephone Encounter (Signed)
I tried to call patient on phone number listed on her chart, mailbox is full----I have sent mychart message----cortisol lab is normal, if patient needs copy of lab, we can either mail to her or she can pick up copy at our office, any questions, can talk with tamara

## 2017-03-28 NOTE — Telephone Encounter (Signed)
Patient did call back.  Gave her MD response.

## 2017-04-01 ENCOUNTER — Ambulatory Visit: Payer: Federal, State, Local not specified - PPO | Admitting: Family Medicine

## 2017-04-01 DIAGNOSIS — Z713 Dietary counseling and surveillance: Secondary | ICD-10-CM | POA: Diagnosis not present

## 2017-04-03 DIAGNOSIS — F411 Generalized anxiety disorder: Secondary | ICD-10-CM | POA: Diagnosis not present

## 2017-04-10 DIAGNOSIS — F411 Generalized anxiety disorder: Secondary | ICD-10-CM | POA: Diagnosis not present

## 2017-04-17 DIAGNOSIS — F411 Generalized anxiety disorder: Secondary | ICD-10-CM | POA: Diagnosis not present

## 2017-05-01 DIAGNOSIS — F411 Generalized anxiety disorder: Secondary | ICD-10-CM | POA: Diagnosis not present

## 2017-05-06 ENCOUNTER — Telehealth: Payer: Self-pay | Admitting: Family Medicine

## 2017-05-06 ENCOUNTER — Telehealth: Payer: Self-pay | Admitting: Internal Medicine

## 2017-05-06 NOTE — Telephone Encounter (Signed)
Pt has not been able to receive SONATA 5 MG capsule  [facility] cannot get it [facility], she would like it sent to [facility] on [address].   Pt needs a refill of her BP medicine( she did not know name) [facility] [address]    Pt would like a call back.

## 2017-05-06 NOTE — Telephone Encounter (Signed)
Pt would like to know what brands pf mattresses Dr. Tamala Julian could recommend for her and for her to get her best sleep.

## 2017-05-07 MED ORDER — SONATA 5 MG PO CAPS: 5.0000 mg | ORAL_CAPSULE | Freq: Every evening | ORAL | 1 refills | Status: DC | PRN

## 2017-05-07 MED ORDER — LOSARTAN POTASSIUM-HCTZ 100-25 MG PO TABS: 1.0000 | ORAL_TABLET | Freq: Every day | ORAL | 1 refills | Status: DC

## 2017-05-07 NOTE — Telephone Encounter (Signed)
[employer] [facility] rest black  Rigid Is better

## 2017-05-07 NOTE — Telephone Encounter (Signed)
Pt notified, RX sent/called in

## 2017-05-07 NOTE — Telephone Encounter (Signed)
Pt called back regarding this.  

## 2017-05-07 NOTE — Telephone Encounter (Signed)
Let pt know that Dr. Tamala Julian is out of the office this week & will call her back next week.

## 2017-05-08 DIAGNOSIS — F411 Generalized anxiety disorder: Secondary | ICD-10-CM | POA: Diagnosis not present

## 2017-05-09 DIAGNOSIS — Z713 Dietary counseling and surveillance: Secondary | ICD-10-CM | POA: Diagnosis not present

## 2017-05-14 NOTE — Telephone Encounter (Signed)
Discussed with pt

## 2017-05-15 DIAGNOSIS — F411 Generalized anxiety disorder: Secondary | ICD-10-CM | POA: Diagnosis not present

## 2017-05-17 ENCOUNTER — Telehealth: Payer: Self-pay | Admitting: Internal Medicine

## 2017-05-17 NOTE — Telephone Encounter (Signed)
Anderson Malta from [facility] was wanting to speak with Dr Alain Marion regarding the medications that this pt is currently on for anxiety and sleep issues. She can be reached on her cell phone [phone]).

## 2017-05-17 NOTE — Telephone Encounter (Signed)
Sydney [name] states her sleeping issues are related to her anxiety. She states pt is not very insightful to her anxiety. Sydney [name] recommended starting the patient on Clonazepam 0.5mg  tablet, 1/2 tab TID.  Will you schedule patient an appointment for when patient can come in thank you!

## 2017-05-20 NOTE — Telephone Encounter (Signed)
Appointment scheduled 06/11/2017.

## 2017-06-11 ENCOUNTER — Ambulatory Visit (INDEPENDENT_AMBULATORY_CARE_PROVIDER_SITE_OTHER): Payer: Federal, State, Local not specified - PPO | Admitting: Internal Medicine

## 2017-06-11 ENCOUNTER — Encounter: Payer: Self-pay | Admitting: Internal Medicine

## 2017-06-11 DIAGNOSIS — F418 Other specified anxiety disorders: Secondary | ICD-10-CM | POA: Diagnosis not present

## 2017-06-11 DIAGNOSIS — G4700 Insomnia, unspecified: Secondary | ICD-10-CM | POA: Diagnosis not present

## 2017-06-11 MED ORDER — TRAZODONE HCL 50 MG PO TABS: 25.0000 mg | ORAL_TABLET | Freq: Every evening | ORAL | 3 refills | Status: DC | PRN

## 2017-06-11 MED ORDER — CLONAZEPAM 0.25 MG PO TBDP: 0.2500 mg | ORAL_TABLET | Freq: Three times a day (TID) | ORAL | 3 refills | Status: DC | PRN

## 2017-06-11 NOTE — Progress Notes (Signed)
Subjective:  Patient ID: Sydney [name], female    DOB: November 26, 1966  Age: 51 y.o. MRN: [id number]  CC: No chief complaint on file.   HPI Sydney [name] presents for insomnia, stress. The pt is seeing a therapist - she thinks the pt needs Rx Anderson Malta recommended starting the patient on Clonazepam 0.5mg  tablet, 1/2 tab TID. Working 50 h per week  Outpatient Medications Prior to Visit  Medication Sig Dispense Refill  . Biotin 1000 MCG tablet Take 1 tablet (1 mg total) by mouth daily. 90 tablet 3  . cholecalciferol (VITAMIN D) 1000 UNITS tablet Take 1 tablet (1,000 Units total) by mouth daily. 90 tablet 3  . ferrous sulfate 325 (65 FE) MG EC tablet Take 1 tablet (325 mg total) by mouth daily with breakfast. 90 tablet 3  . fish oil-omega-3 fatty acids 1000 MG capsule Take 1 g by mouth daily.    . hydrocortisone 1 % lotion Apply 1 application topically daily. 59 mL 0  . ibuprofen (ADVIL,MOTRIN) 200 MG tablet Take 200 mg by mouth daily as needed.    . loratadine (CLARITIN) 10 MG tablet Take 1 tablet (10 mg total) by mouth daily. 90 tablet 0  . losartan-hydrochlorothiazide (HYZAAR) 100-25 MG tablet Take 1 tablet by mouth daily. 90 tablet 1  . pyridoxine (B-6) 200 MG tablet Take 1 tablet (200 mg total) by mouth daily. 90 tablet 2  . SONATA 5 MG capsule Take 1 capsule (5 mg total) by mouth at bedtime as needed for sleep. 30 capsule 1  . triamcinolone cream (KENALOG) 0.1 % Apply 1 application topically 2 (two) times daily. 30 g 0  . valACYclovir (VALTREX) 1000 MG tablet Take 1 tablet (1,000 mg total) by mouth 3 (three) times daily. (Patient taking differently: Take 1,000 mg by mouth 3 (three) times daily as needed. ) 21 tablet 0  . vitamin A 10000 UNIT capsule Take 1 capsule (10,000 Units total) by mouth daily. 90 capsule 3  . vitamin B-12 (CYANOCOBALAMIN) 1000 MCG tablet Take 1 tablet (1,000 mcg total) by mouth daily. 90 tablet 3  . Vitamins C E (VITAMIN C & E COMBINATION) 500-400 MG-UNIT CAPS  Take 1 capsule daily 90 each 3   No facility-administered medications prior to visit.     ROS Review of Systems  Constitutional: Negative for activity change, appetite change, chills, fatigue and unexpected weight change.  HENT: Negative for congestion, mouth sores and sinus pressure.   Eyes: Negative for visual disturbance.  Respiratory: Negative for cough and chest tightness.   Gastrointestinal: Negative for abdominal pain and nausea.  Genitourinary: Negative for difficulty urinating, frequency and vaginal pain.  Musculoskeletal: Negative for back pain and gait problem.  Skin: Negative for pallor and rash.  Neurological: Negative for dizziness, tremors, weakness, numbness and headaches.  Psychiatric/Behavioral: Positive for sleep disturbance. Negative for confusion, decreased concentration and dysphoric mood. The patient is nervous/anxious.     Objective:  BP 126/80 (BP Location: Left Arm, Patient Position: Sitting, Cuff Size: Large)   Pulse (!) 56   Temp 98.5 F (36.9 C) (Oral)   Ht 5\' 2"  (1.575 m)   Wt 176 lb (79.8 kg)   LMP 05/04/2012   SpO2 99%   BMI 32.19 kg/m   BP Readings from Last 3 Encounters:  06/11/17 126/80  02/04/17 (!) 142/100  02/04/17 (!) 140/98    Wt Readings from Last 3 Encounters:  06/11/17 176 lb (79.8 kg)  02/04/17 176 lb (79.8 kg)  02/04/17 176 lb (79.8 kg)  Physical Exam  Constitutional: She appears well-developed. No distress.  HENT:  Head: Normocephalic.  Right Ear: External ear normal.  Left Ear: External ear normal.  Nose: Nose normal.  Mouth/Throat: Oropharynx is clear and moist.  Eyes: Conjunctivae are normal. Pupils are equal, round, and reactive to light. Right eye exhibits no discharge. Left eye exhibits no discharge.  Neck: Normal range of motion. Neck supple. No JVD present. No tracheal deviation present. No thyromegaly present.  Cardiovascular: Normal rate, regular rhythm and normal heart sounds.   Pulmonary/Chest: No  stridor. No respiratory distress. She has no wheezes.  Abdominal: Soft. Bowel sounds are normal. She exhibits no distension and no mass. There is no tenderness. There is no rebound and no guarding.  Musculoskeletal: She exhibits no edema or tenderness.  Lymphadenopathy:    She has no cervical adenopathy.  Neurological: She displays normal reflexes. No cranial nerve deficit. She exhibits normal muscle tone. Coordination normal.  Skin: No rash noted. No erythema.  Psychiatric: She has a normal mood and affect. Her behavior is normal. Judgment and thought content normal.    Lab Results  Component Value Date   WBC 4.2 06/25/2016   HGB 13.4 06/25/2016   HCT 39.9 06/25/2016   PLT 253.0 06/25/2016   GLUCOSE 83 06/25/2016   CHOL 200 06/25/2016   TRIG 86.0 06/25/2016   HDL 83.70 06/25/2016   LDLCALC 99 06/25/2016   ALT 20 06/25/2016   AST 18 06/25/2016   NA 141 06/25/2016   K 3.6 06/25/2016   CL 102 06/25/2016   CREATININE 0.78 06/25/2016   BUN 16 06/25/2016   CO2 34 (H) 06/25/2016   TSH 1.40 06/25/2016   HGBA1C 6.1 10/13/2010    Dg Knee Bilateral Standing Ap  Result Date: 01/03/2016 CLINICAL DATA:  History of torn meniscus on the right in 2015, now with pain and left knee soreness, no known injury. EXAM: BILATERAL KNEES STANDING - 1 VIEW COMPARISON:  There no previous studies for review. FINDINGS: Weight-bearing views of both knees reveal the bones to be adequately mineralized. On the left there is mild narrowing of the medial joint compartment. There are osteophytes arising from the articular margins of the medial femoral condyle and medial tibial plateau. There is beaking of the tibial spines. The lateral joint space appears preserved. The proximal fibula is intact. On the right there is mild narrowing of the medial joint compartment. Osteophytes arise from the articular margins of the medial femoral condyle and medial tibial plateau. There is beaking of the tibial spines. The lateral  joint space is preserved. The proximal fibula is unremarkable. IMPRESSION: There is moderate osteoarthritic change centered on the medial joint compartments and the tibial spines bilaterally. No acute bony abnormality is observed. Electronically Signed   By: David  Martinique M.D.   On: 01/03/2016 14:41    Assessment & Plan:   There are no diagnoses linked to this encounter. I am having Ms. Larose maintain her fish oil-omega-3 fatty acids, cholecalciferol, vitamin B-12, vitamin A, VITAMIN C & E COMBINATION, Biotin, ibuprofen, pyridoxine, ferrous sulfate, hydrocortisone, valACYclovir, triamcinolone cream, loratadine, SONATA, and losartan-hydrochlorothiazide.  No orders of the defined types were placed in this encounter.    Follow-up: No Follow-up on file.  Walker Kehr, MD

## 2017-06-11 NOTE — Assessment & Plan Note (Signed)
Seeing a therapist 2018 Clonazepam was recommended Trazodone low dose prn

## 2017-06-11 NOTE — Patient Instructions (Signed)
Valerian root for insomnia, anxiety 

## 2017-06-11 NOTE — Assessment & Plan Note (Signed)
Trazodone low dose prn 

## 2017-06-26 DIAGNOSIS — F411 Generalized anxiety disorder: Secondary | ICD-10-CM | POA: Diagnosis not present

## 2017-07-04 DIAGNOSIS — L668 Other cicatricial alopecia: Secondary | ICD-10-CM | POA: Diagnosis not present

## 2017-07-08 DIAGNOSIS — Z713 Dietary counseling and surveillance: Secondary | ICD-10-CM | POA: Diagnosis not present

## 2017-07-24 DIAGNOSIS — F411 Generalized anxiety disorder: Secondary | ICD-10-CM | POA: Diagnosis not present

## 2017-08-07 DIAGNOSIS — F411 Generalized anxiety disorder: Secondary | ICD-10-CM | POA: Diagnosis not present

## 2017-08-26 DIAGNOSIS — Z713 Dietary counseling and surveillance: Secondary | ICD-10-CM | POA: Diagnosis not present

## 2017-09-02 DIAGNOSIS — K080 Exfoliation of teeth due to systemic causes: Secondary | ICD-10-CM | POA: Diagnosis not present

## 2017-09-04 DIAGNOSIS — F411 Generalized anxiety disorder: Secondary | ICD-10-CM | POA: Diagnosis not present

## 2017-09-05 DIAGNOSIS — L668 Other cicatricial alopecia: Secondary | ICD-10-CM | POA: Diagnosis not present

## 2017-09-12 ENCOUNTER — Telehealth: Payer: Self-pay | Admitting: Internal Medicine

## 2017-09-12 NOTE — Telephone Encounter (Signed)
LMTCB

## 2017-09-12 NOTE — Telephone Encounter (Signed)
Called asking to speak to someone about the patients medicines  Please call back

## 2017-09-16 ENCOUNTER — Ambulatory Visit: Payer: Federal, State, Local not specified - PPO | Admitting: Internal Medicine

## 2017-09-17 MED ORDER — TRAZODONE HCL 50 MG PO TABS: 75.0000 mg | ORAL_TABLET | Freq: Every evening | ORAL | 2 refills | Status: DC | PRN

## 2017-09-17 NOTE — Telephone Encounter (Signed)
Done erx 

## 2017-09-17 NOTE — Telephone Encounter (Signed)
Anderson Malta called back and stated patient is still not sleeping even though she was put on Trazodone 25-50mg  [facility] and would like to know if her Trazodone can be increased to 75mg . Please advise

## 2017-09-17 NOTE — Telephone Encounter (Signed)
Social worker notified

## 2017-09-18 DIAGNOSIS — F411 Generalized anxiety disorder: Secondary | ICD-10-CM | POA: Diagnosis not present

## 2017-09-30 ENCOUNTER — Encounter: Payer: Self-pay | Admitting: Internal Medicine

## 2017-09-30 ENCOUNTER — Ambulatory Visit (INDEPENDENT_AMBULATORY_CARE_PROVIDER_SITE_OTHER): Payer: Federal, State, Local not specified - PPO | Admitting: Internal Medicine

## 2017-09-30 VITALS — BP 118/76 | HR 60 | Temp 98.5°F | Ht 62.0 in | Wt 181.0 lb

## 2017-09-30 DIAGNOSIS — R635 Abnormal weight gain: Secondary | ICD-10-CM | POA: Diagnosis not present

## 2017-09-30 DIAGNOSIS — I10 Essential (primary) hypertension: Secondary | ICD-10-CM

## 2017-09-30 DIAGNOSIS — G4700 Insomnia, unspecified: Secondary | ICD-10-CM | POA: Diagnosis not present

## 2017-09-30 DIAGNOSIS — Z0000 Encounter for general adult medical examination without abnormal findings: Secondary | ICD-10-CM

## 2017-09-30 MED ORDER — LOSARTAN POTASSIUM-HCTZ 100-25 MG PO TABS: 1.0000 | ORAL_TABLET | Freq: Every day | ORAL | 2 refills | Status: DC

## 2017-09-30 MED ORDER — TRAZODONE HCL 100 MG PO TABS: 100.0000 mg | ORAL_TABLET | Freq: Every day | ORAL | 5 refills | Status: DC

## 2017-09-30 NOTE — Assessment & Plan Note (Signed)
Hyzaar 

## 2017-09-30 NOTE — Assessment & Plan Note (Signed)
Increase Trazodone to 100 mg at hs

## 2017-09-30 NOTE — Assessment & Plan Note (Addendum)
Wt Readings from Last 3 Encounters:  09/30/17 181 lb (82.1 kg)  06/11/17 176 lb (79.8 kg)  02/04/17 176 lb (79.8 kg)  diet discussed Will ref to Dr Leafy Ro BMI 33 Trazodone is not making her eat more

## 2017-09-30 NOTE — Progress Notes (Signed)
Subjective:  Patient ID: Sydney [name], female    DOB: 1966-01-20  Age: 51 y.o. MRN: [id number]  CC: No chief complaint on file.   HPI Sydney [name] presents for HTN, anxiety, insomnia f/u. Doing OK. Working a lot.. C/o wt gain. Trazodone is not making her eat more. On a good diet  Outpatient Medications Prior to Visit  Medication Sig Dispense Refill  . Biotin 1000 MCG tablet Take 1 tablet (1 mg total) by mouth daily. 90 tablet 3  . cholecalciferol (VITAMIN D) 1000 UNITS tablet Take 1 tablet (1,000 Units total) by mouth daily. 90 tablet 3  . clonazePAM (KLONOPIN) 0.25 MG disintegrating tablet Take 1 tablet (0.25 mg total) by mouth 3 (three) times daily as needed for seizure. 90 tablet 3  . ferrous sulfate 325 (65 FE) MG EC tablet Take 1 tablet (325 mg total) by mouth daily with breakfast. 90 tablet 3  . fish oil-omega-3 fatty acids 1000 MG capsule Take 1 g by mouth daily.    . hydrocortisone 1 % lotion Apply 1 application topically daily. 59 mL 0  . ibuprofen (ADVIL,MOTRIN) 200 MG tablet Take 200 mg by mouth daily as needed.    . loratadine (CLARITIN) 10 MG tablet Take 1 tablet (10 mg total) by mouth daily. 90 tablet 0  . losartan-hydrochlorothiazide (HYZAAR) 100-25 MG tablet Take 1 tablet by mouth daily. 90 tablet 1  . pyridoxine (B-6) 200 MG tablet Take 1 tablet (200 mg total) by mouth daily. 90 tablet 2  . traZODone (DESYREL) 50 MG tablet Take 1.5 tablets (75 mg total) by mouth at bedtime as needed for sleep. 45 tablet 2  . triamcinolone cream (KENALOG) 0.1 % Apply 1 application topically 2 (two) times daily. 30 g 0  . valACYclovir (VALTREX) 1000 MG tablet Take 1 tablet (1,000 mg total) by mouth 3 (three) times daily. (Patient taking differently: Take 1,000 mg by mouth 3 (three) times daily as needed. ) 21 tablet 0  . vitamin A 10000 UNIT capsule Take 1 capsule (10,000 Units total) by mouth daily. 90 capsule 3  . vitamin B-12 (CYANOCOBALAMIN) 1000 MCG tablet Take 1 tablet (1,000  mcg total) by mouth daily. 90 tablet 3  . Vitamins C E (VITAMIN C & E COMBINATION) 500-400 MG-UNIT CAPS Take 1 capsule daily 90 each 3   No facility-administered medications prior to visit.     ROS Review of Systems  Constitutional: Negative for activity change, appetite change, chills, fatigue and unexpected weight change.  HENT: Negative for congestion, mouth sores and sinus pressure.   Eyes: Negative for visual disturbance.  Respiratory: Negative for cough and chest tightness.   Gastrointestinal: Negative for abdominal pain and nausea.  Genitourinary: Negative for difficulty urinating, frequency and vaginal pain.  Musculoskeletal: Negative for back pain and gait problem.  Skin: Negative for pallor and rash.  Neurological: Negative for dizziness, tremors, weakness, numbness and headaches.  Psychiatric/Behavioral: Positive for sleep disturbance. Negative for confusion and suicidal ideas. The patient is not nervous/anxious.     Objective:  BP 118/76 (BP Location: Left Arm, Patient Position: Sitting, Cuff Size: Large)   Pulse 60   Temp 98.5 F (36.9 C) (Oral)   Ht 5\' 2"  (1.575 m)   Wt 181 lb (82.1 kg)   LMP 05/04/2012   SpO2 98%   BMI 33.11 kg/m   BP Readings from Last 3 Encounters:  09/30/17 118/76  06/11/17 126/80  02/04/17 (!) 142/100    Wt Readings from Last 3 Encounters:  09/30/17  181 lb (82.1 kg)  06/11/17 176 lb (79.8 kg)  02/04/17 176 lb (79.8 kg)    Physical Exam  Constitutional: She appears well-developed. No distress.  HENT:  Head: Normocephalic.  Right Ear: External ear normal.  Left Ear: External ear normal.  Nose: Nose normal.  Mouth/Throat: Oropharynx is clear and moist.  Eyes: Pupils are equal, round, and reactive to light. Conjunctivae are normal. Right eye exhibits no discharge. Left eye exhibits no discharge.  Neck: Normal range of motion. Neck supple. No JVD present. No tracheal deviation present. No thyromegaly present.  Cardiovascular: Normal  rate, regular rhythm and normal heart sounds.   Pulmonary/Chest: No stridor. No respiratory distress. She has no wheezes.  Abdominal: Soft. Bowel sounds are normal. She exhibits no distension and no mass. There is no tenderness. There is no rebound and no guarding.  Musculoskeletal: She exhibits no edema or tenderness.  Lymphadenopathy:    She has no cervical adenopathy.  Neurological: She displays normal reflexes. No cranial nerve deficit. She exhibits normal muscle tone. Coordination normal.  Skin: No rash noted. No erythema.  Psychiatric: She has a normal mood and affect. Her behavior is normal. Judgment and thought content normal.    Lab Results  Component Value Date   WBC 4.2 06/25/2016   HGB 13.4 06/25/2016   HCT 39.9 06/25/2016   PLT 253.0 06/25/2016   GLUCOSE 83 06/25/2016   CHOL 200 06/25/2016   TRIG 86.0 06/25/2016   HDL 83.70 06/25/2016   LDLCALC 99 06/25/2016   ALT 20 06/25/2016   AST 18 06/25/2016   NA 141 06/25/2016   K 3.6 06/25/2016   CL 102 06/25/2016   CREATININE 0.78 06/25/2016   BUN 16 06/25/2016   CO2 34 (H) 06/25/2016   TSH 1.40 06/25/2016   HGBA1C 6.1 10/13/2010    Dg Knee Bilateral Standing Ap  Result Date: 01/03/2016 CLINICAL DATA:  History of torn meniscus on the right in 2015, now with pain and left knee soreness, no known injury. EXAM: BILATERAL KNEES STANDING - 1 VIEW COMPARISON:  There no previous studies for review. FINDINGS: Weight-bearing views of both knees reveal the bones to be adequately mineralized. On the left there is mild narrowing of the medial joint compartment. There are osteophytes arising from the articular margins of the medial femoral condyle and medial tibial plateau. There is beaking of the tibial spines. The lateral joint space appears preserved. The proximal fibula is intact. On the right there is mild narrowing of the medial joint compartment. Osteophytes arise from the articular margins of the medial femoral condyle and medial  tibial plateau. There is beaking of the tibial spines. The lateral joint space is preserved. The proximal fibula is unremarkable. IMPRESSION: There is moderate osteoarthritic change centered on the medial joint compartments and the tibial spines bilaterally. No acute bony abnormality is observed. Electronically Signed   By: David  Martinique M.D.   On: 01/03/2016 14:41    Assessment & Plan:   There are no diagnoses linked to this encounter. I am having Ms. Benevides maintain her fish oil-omega-3 fatty acids, cholecalciferol, vitamin B-12, vitamin A, VITAMIN C & E COMBINATION, Biotin, ibuprofen, pyridoxine, ferrous sulfate, hydrocortisone, valACYclovir, triamcinolone cream, loratadine, losartan-hydrochlorothiazide, clonazePAM, and traZODone.  No orders of the defined types were placed in this encounter.    Follow-up: No Follow-up on file.  Walker Kehr, MD

## 2017-10-02 DIAGNOSIS — F411 Generalized anxiety disorder: Secondary | ICD-10-CM | POA: Diagnosis not present

## 2017-10-07 DIAGNOSIS — Z713 Dietary counseling and surveillance: Secondary | ICD-10-CM | POA: Diagnosis not present

## 2017-10-30 DIAGNOSIS — F411 Generalized anxiety disorder: Secondary | ICD-10-CM | POA: Diagnosis not present

## 2017-10-31 DIAGNOSIS — G4700 Insomnia, unspecified: Secondary | ICD-10-CM | POA: Diagnosis not present

## 2017-11-13 DIAGNOSIS — F411 Generalized anxiety disorder: Secondary | ICD-10-CM | POA: Diagnosis not present

## 2017-12-16 DIAGNOSIS — Z713 Dietary counseling and surveillance: Secondary | ICD-10-CM | POA: Diagnosis not present

## 2017-12-26 ENCOUNTER — Other Ambulatory Visit: Payer: Self-pay | Admitting: Internal Medicine

## 2017-12-27 NOTE — Telephone Encounter (Signed)
RX d/c 06/11/17, please advise

## 2017-12-30 ENCOUNTER — Telehealth: Payer: Self-pay | Admitting: Internal Medicine

## 2017-12-30 MED ORDER — HYDROCHLOROTHIAZIDE 25 MG PO TABS: 25.0000 mg | ORAL_TABLET | Freq: Every day | ORAL | 3 refills | Status: DC

## 2017-12-30 MED ORDER — LOSARTAN POTASSIUM 100 MG PO TABS: 100.0000 mg | ORAL_TABLET | Freq: Every day | ORAL | 3 refills | Status: DC

## 2017-12-30 NOTE — Telephone Encounter (Signed)
Copied from [place] [id number]. Topic: General - Other >> Dec 30, 2017  3:06 PM Yvette Rack wrote: Reason for CRM: Maudie Mercury from [place], [place] - [place] [address] Calling stating that the patient has been out of her blood pressure medicine for 4 days and she has been able to sleep for 4 days the losartan-hydrochlorothiazide (HYZAAR) 100-25 MG tablet has been on back order and want to know if it can be changed to two separate prescriptions  losartan 100mg  and HCTZ .25 mg  the pharmacist is scared that the patient would have a heart attack due to her not sleeping or on medicine for 4 days  [facility] also states that she need her Sonata 5mg  to be filled also because of not being able to sleep for 4 days

## 2017-12-30 NOTE — Telephone Encounter (Signed)
Sorry about the problem.  The changes made.  Prescription was emailed.  Thank you

## 2018-01-01 MED ORDER — ZALEPLON 5 MG PO CAPS: 5.0000 mg | ORAL_CAPSULE | Freq: Every evening | ORAL | 1 refills | Status: DC | PRN

## 2018-01-01 NOTE — Telephone Encounter (Signed)
See attached pt request for samples.

## 2018-01-01 NOTE — Telephone Encounter (Signed)
RX faxed, we do not have samples pt notified

## 2018-01-01 NOTE — Addendum Note (Signed)
Addended by: Karren Cobble on: 01/01/2018 02:51 PM   Modules accepted: Orders

## 2018-01-01 NOTE — Telephone Encounter (Signed)
Patient called back and said that the doctor sent a prescription for SONATA 5 MG capsule but insurance does not cover medication. Medication must be the generic in order for insurance to pay. Pt uses [facility] on [address]. Also patient wanted to know if the doctor has any samples of the medication in the office because she is completely out. Patient said she can come by and pick up samples until her medication is ready for pick up. Pt would like a call back. [phone]

## 2018-03-24 ENCOUNTER — Other Ambulatory Visit (INDEPENDENT_AMBULATORY_CARE_PROVIDER_SITE_OTHER): Payer: Federal, State, Local not specified - PPO

## 2018-03-24 ENCOUNTER — Ambulatory Visit (INDEPENDENT_AMBULATORY_CARE_PROVIDER_SITE_OTHER): Payer: Federal, State, Local not specified - PPO | Admitting: Internal Medicine

## 2018-03-24 ENCOUNTER — Encounter: Payer: Self-pay | Admitting: Internal Medicine

## 2018-03-24 VITALS — BP 120/74 | HR 83 | Temp 98.0°F | Ht 62.0 in | Wt 180.0 lb

## 2018-03-24 DIAGNOSIS — F418 Other specified anxiety disorders: Secondary | ICD-10-CM | POA: Diagnosis not present

## 2018-03-24 DIAGNOSIS — I10 Essential (primary) hypertension: Secondary | ICD-10-CM

## 2018-03-24 DIAGNOSIS — G4700 Insomnia, unspecified: Secondary | ICD-10-CM

## 2018-03-24 DIAGNOSIS — Z0000 Encounter for general adult medical examination without abnormal findings: Secondary | ICD-10-CM

## 2018-03-24 LAB — URINALYSIS
BILIRUBIN URINE: NEGATIVE
HGB URINE DIPSTICK: NEGATIVE
Ketones, ur: NEGATIVE
Leukocytes, UA: NEGATIVE
Nitrite: NEGATIVE
PH: 7 (ref 5.0–8.0)
Specific Gravity, Urine: <=1.005 — AB (ref 1.000–1.030)
TOTAL PROTEIN, URINE-UPE24: NEGATIVE
URINE GLUCOSE: NEGATIVE
Urobilinogen, UA: 0.2 (ref 0.0–1.0)

## 2018-03-24 LAB — HEPATIC FUNCTION PANEL
ALK PHOS: 85 U/L (ref 39–117)
ALT: 19 U/L (ref 0–35)
AST: 19 U/L (ref 0–37)
Albumin: 4.3 g/dL (ref 3.5–5.2)
Bilirubin, Direct: 0.1 mg/dL (ref 0.0–0.3)
Total Bilirubin: 0.3 mg/dL (ref 0.2–1.2)
Total Protein: 7.5 g/dL (ref 6.0–8.3)

## 2018-03-24 LAB — LIPID PANEL
CHOL/HDL RATIO: 2
Cholesterol: 167 mg/dL (ref 0–200)
HDL: 69.5 mg/dL (ref >39.00)
LDL Cholesterol: 67 mg/dL (ref 0–99)
NONHDL: 97.17
Triglycerides: 153 mg/dL — ABNORMAL HIGH (ref 0.0–149.0)
VLDL: 30.6 mg/dL (ref 0.0–40.0)

## 2018-03-24 LAB — CBC WITH DIFFERENTIAL/PLATELET
Basophils Absolute: 0 10*3/uL (ref 0.0–0.1)
Basophils Relative: 0.3 % (ref 0.0–3.0)
EOS ABS: 0.1 10*3/uL (ref 0.0–0.7)
Eosinophils Relative: 1.1 % (ref 0.0–5.0)
HCT: 41 % (ref 36.0–46.0)
Hemoglobin: 13.8 g/dL (ref 12.0–15.0)
LYMPHS ABS: 1.8 10*3/uL (ref 0.7–4.0)
Lymphocytes Relative: 39.6 % (ref 12.0–46.0)
MCHC: 33.6 g/dL (ref 30.0–36.0)
MCV: 84 fl (ref 78.0–100.0)
MONO ABS: 0.5 10*3/uL (ref 0.1–1.0)
MONOS PCT: 10.8 % (ref 3.0–12.0)
NEUTROS PCT: 48.2 % (ref 43.0–77.0)
Neutro Abs: 2.1 10*3/uL (ref 1.4–7.7)
Platelets: 294 10*3/uL (ref 150.0–400.0)
RBC: 4.88 Mil/uL (ref 3.87–5.11)
RDW: 14.4 % (ref 11.5–15.5)
WBC: 4.4 10*3/uL (ref 4.0–10.5)

## 2018-03-24 LAB — BASIC METABOLIC PANEL
BUN: 11 mg/dL (ref 6–23)
CHLORIDE: 99 meq/L (ref 96–112)
CO2: 32 meq/L (ref 19–32)
Calcium: 9.7 mg/dL (ref 8.4–10.5)
Creatinine, Ser: 0.72 mg/dL (ref 0.40–1.20)
GFR: 110.37 mL/min (ref >60.00)
Glucose, Bld: 89 mg/dL (ref 70–99)
Potassium: 3.7 mEq/L (ref 3.5–5.1)
SODIUM: 139 meq/L (ref 135–145)

## 2018-03-24 LAB — IRON,TIBC AND FERRITIN PANEL
%SAT: 16 % (ref 11–50)
FERRITIN: 118 ng/mL (ref 10–232)
IRON: 56 ug/dL (ref 40–190)
TIBC: 341 ug/dL (ref 250–450)

## 2018-03-24 LAB — TSH: TSH: 1.61 u[IU]/mL (ref 0.35–4.50)

## 2018-03-24 LAB — VITAMIN D 25 HYDROXY (VIT D DEFICIENCY, FRACTURES): VITD: 21.83 ng/mL — AB (ref 30.00–100.00)

## 2018-03-24 LAB — VITAMIN B12: Vitamin B-12: 751 pg/mL (ref 211–911)

## 2018-03-24 MED ORDER — LOSARTAN POTASSIUM 100 MG PO TABS: 100.0000 mg | ORAL_TABLET | Freq: Every day | ORAL | 3 refills | Status: DC

## 2018-03-24 MED ORDER — HYDROCHLOROTHIAZIDE 25 MG PO TABS: 25.0000 mg | ORAL_TABLET | Freq: Every day | ORAL | 3 refills | Status: DC

## 2018-03-24 NOTE — Assessment & Plan Note (Addendum)
Trazodone to 100 mg at hs -d/c

## 2018-03-24 NOTE — Progress Notes (Signed)
Subjective:  Patient ID: Sydney [name], female    DOB: Jan 16, 1966  Age: 52 y.o. MRN: [id number]  CC: No chief complaint on file.   HPI Sydney [name] presents for a well exam C/o insomnia - refractory   C/o wt gain   Outpatient Medications Prior to Visit  Medication Sig Dispense Refill  . Biotin 1000 MCG tablet Take 1 tablet (1 mg total) by mouth daily. 90 tablet 3  . cholecalciferol (VITAMIN D) 1000 UNITS tablet Take 1 tablet (1,000 Units total) by mouth daily. 90 tablet 3  . clonazePAM (KLONOPIN) 0.25 MG disintegrating tablet Take 1 tablet (0.25 mg total) by mouth 3 (three) times daily as needed for seizure. 90 tablet 3  . ferrous sulfate 325 (65 FE) MG EC tablet Take 1 tablet (325 mg total) by mouth daily with breakfast. 90 tablet 3  . fish oil-omega-3 fatty acids 1000 MG capsule Take 1 g by mouth daily.    . hydrochlorothiazide (HYDRODIURIL) 25 MG tablet Take 1 tablet (25 mg total) by mouth daily. 90 tablet 3  . hydrocortisone 1 % lotion Apply 1 application topically daily. 59 mL 0  . ibuprofen (ADVIL,MOTRIN) 200 MG tablet Take 200 mg by mouth daily as needed.    . loratadine (CLARITIN) 10 MG tablet Take 1 tablet (10 mg total) by mouth daily. 90 tablet 0  . losartan (COZAAR) 100 MG tablet Take 1 tablet (100 mg total) by mouth daily. 90 tablet 3  . pyridoxine (B-6) 200 MG tablet Take 1 tablet (200 mg total) by mouth daily. 90 tablet 2  . traZODone (DESYREL) 100 MG tablet Take 1 tablet (100 mg total) by mouth at bedtime. 30 tablet 5  . triamcinolone cream (KENALOG) 0.1 % Apply 1 application topically 2 (two) times daily. 30 g 0  . valACYclovir (VALTREX) 1000 MG tablet Take 1 tablet (1,000 mg total) by mouth 3 (three) times daily. (Patient taking differently: Take 1,000 mg by mouth 3 (three) times daily as needed. ) 21 tablet 0  . vitamin A 10000 UNIT capsule Take 1 capsule (10,000 Units total) by mouth daily. 90 capsule 3  . vitamin B-12 (CYANOCOBALAMIN) 1000 MCG tablet Take 1  tablet (1,000 mcg total) by mouth daily. 90 tablet 3  . Vitamins C E (VITAMIN C & E COMBINATION) 500-400 MG-UNIT CAPS Take 1 capsule daily 90 each 3  . zaleplon (SONATA) 5 MG capsule Take 1 capsule (5 mg total) by mouth at bedtime as needed for sleep. 30 capsule 1   No facility-administered medications prior to visit.     ROS Review of Systems  Constitutional: Negative for activity change, appetite change, chills, fatigue and unexpected weight change.  HENT: Negative for congestion, mouth sores and sinus pressure.   Eyes: Negative for visual disturbance.  Respiratory: Negative for cough and chest tightness.   Gastrointestinal: Negative for abdominal pain and nausea.  Genitourinary: Negative for difficulty urinating, frequency and vaginal pain.  Musculoskeletal: Negative for back pain and gait problem.  Skin: Negative for pallor and rash.  Neurological: Negative for dizziness, tremors, weakness, numbness and headaches.  Psychiatric/Behavioral: Negative for confusion, sleep disturbance and suicidal ideas.    Objective:  BP 120/74 (BP Location: Left Arm, Patient Position: Sitting, Cuff Size: Large)   Pulse 83   Temp 98 F (36.7 C) (Oral)   Ht 5\' 2"  (1.575 m)   Wt 180 lb (81.6 kg)   LMP 05/04/2012   SpO2 98%   BMI 32.92 kg/m   BP Readings from  Last 3 Encounters:  03/24/18 120/74  09/30/17 118/76  06/11/17 126/80    Wt Readings from Last 3 Encounters:  03/24/18 180 lb (81.6 kg)  09/30/17 181 lb (82.1 kg)  06/11/17 176 lb (79.8 kg)    Physical Exam  Constitutional: She appears well-developed. No distress.  HENT:  Head: Normocephalic.  Right Ear: External ear normal.  Left Ear: External ear normal.  Nose: Nose normal.  Mouth/Throat: Oropharynx is clear and moist.  Eyes: Pupils are equal, round, and reactive to light. Conjunctivae are normal. Right eye exhibits no discharge. Left eye exhibits no discharge.  Neck: Normal range of motion. Neck supple. No JVD present. No  tracheal deviation present. No thyromegaly present.  Cardiovascular: Normal rate, regular rhythm and normal heart sounds.  Pulmonary/Chest: No stridor. No respiratory distress. She has no wheezes.  Abdominal: Soft. Bowel sounds are normal. She exhibits no distension and no mass. There is no tenderness. There is no rebound and no guarding.  Musculoskeletal: She exhibits no edema or tenderness.  Lymphadenopathy:    She has no cervical adenopathy.  Neurological: She displays normal reflexes. No cranial nerve deficit. She exhibits normal muscle tone. Coordination normal.  Skin: No rash noted. No erythema.  Psychiatric: She has a normal mood and affect. Her behavior is normal. Judgment and thought content normal.    Lab Results  Component Value Date   WBC 4.2 06/25/2016   HGB 13.4 06/25/2016   HCT 39.9 06/25/2016   PLT 253.0 06/25/2016   GLUCOSE 83 06/25/2016   CHOL 200 06/25/2016   TRIG 86.0 06/25/2016   HDL 83.70 06/25/2016   LDLCALC 99 06/25/2016   ALT 20 06/25/2016   AST 18 06/25/2016   NA 141 06/25/2016   K 3.6 06/25/2016   CL 102 06/25/2016   CREATININE 0.78 06/25/2016   BUN 16 06/25/2016   CO2 34 (H) 06/25/2016   TSH 1.40 06/25/2016   HGBA1C 6.1 10/13/2010    Dg Knee Bilateral Standing Ap  Result Date: 01/03/2016 CLINICAL DATA:  History of torn meniscus on the right in 2015, now with pain and left knee soreness, no known injury. EXAM: BILATERAL KNEES STANDING - 1 VIEW COMPARISON:  There no previous studies for review. FINDINGS: Weight-bearing views of both knees reveal the bones to be adequately mineralized. On the left there is mild narrowing of the medial joint compartment. There are osteophytes arising from the articular margins of the medial femoral condyle and medial tibial plateau. There is beaking of the tibial spines. The lateral joint space appears preserved. The proximal fibula is intact. On the right there is mild narrowing of the medial joint compartment.  Osteophytes arise from the articular margins of the medial femoral condyle and medial tibial plateau. There is beaking of the tibial spines. The lateral joint space is preserved. The proximal fibula is unremarkable. IMPRESSION: There is moderate osteoarthritic change centered on the medial joint compartments and the tibial spines bilaterally. No acute bony abnormality is observed. Electronically Signed   By: David  Martinique M.D.   On: 01/03/2016 14:41    Assessment & Plan:   There are no diagnoses linked to this encounter. I am having Rise Mu maintain her fish oil-omega-3 fatty acids, cholecalciferol, vitamin B-12, vitamin A, VITAMIN C & E COMBINATION, Biotin, ibuprofen, pyridoxine, ferrous sulfate, hydrocortisone, valACYclovir, triamcinolone cream, loratadine, clonazePAM, traZODone, losartan, hydrochlorothiazide, and zaleplon.  No orders of the defined types were placed in this encounter.    Follow-up: No follow-ups on file.  Alex Laurian Edrington,  MD

## 2018-03-24 NOTE — Assessment & Plan Note (Signed)
Trazodone low dose prn

## 2018-03-24 NOTE — Assessment & Plan Note (Signed)
We discussed age appropriate health related issues, including available/recomended screening tests and vaccinations. We discussed a need for adhering to healthy diet and exercise. Labs/EKG were reviewed/ordered. All questions were answered.   

## 2018-03-24 NOTE — Assessment & Plan Note (Signed)
Losartan and HCTZ 

## 2018-03-25 ENCOUNTER — Other Ambulatory Visit: Payer: Self-pay | Admitting: Internal Medicine

## 2018-03-25 MED ORDER — VITAMIN D3 1.25 MG (50000 UT) PO CAPS: 1.0000 | ORAL_CAPSULE | ORAL | 8 refills | Status: DC

## 2018-03-27 ENCOUNTER — Encounter (INDEPENDENT_AMBULATORY_CARE_PROVIDER_SITE_OTHER): Payer: Self-pay

## 2018-04-01 ENCOUNTER — Ambulatory Visit (INDEPENDENT_AMBULATORY_CARE_PROVIDER_SITE_OTHER): Payer: Self-pay | Admitting: Family Medicine

## 2018-04-04 DIAGNOSIS — Z713 Dietary counseling and surveillance: Secondary | ICD-10-CM | POA: Diagnosis not present

## 2018-05-07 DIAGNOSIS — K080 Exfoliation of teeth due to systemic causes: Secondary | ICD-10-CM | POA: Diagnosis not present

## 2018-05-26 DIAGNOSIS — Z713 Dietary counseling and surveillance: Secondary | ICD-10-CM | POA: Diagnosis not present

## 2018-07-29 DIAGNOSIS — Z713 Dietary counseling and surveillance: Secondary | ICD-10-CM | POA: Diagnosis not present

## 2018-09-08 ENCOUNTER — Ambulatory Visit: Payer: Federal, State, Local not specified - PPO | Admitting: Family Medicine

## 2018-09-08 DIAGNOSIS — Z713 Dietary counseling and surveillance: Secondary | ICD-10-CM | POA: Diagnosis not present

## 2018-09-19 DIAGNOSIS — F419 Anxiety disorder, unspecified: Secondary | ICD-10-CM | POA: Diagnosis not present

## 2018-09-22 ENCOUNTER — Ambulatory Visit: Payer: Federal, State, Local not specified - PPO | Admitting: Internal Medicine

## 2018-09-22 ENCOUNTER — Encounter: Payer: Self-pay | Admitting: Internal Medicine

## 2018-09-22 DIAGNOSIS — I10 Essential (primary) hypertension: Secondary | ICD-10-CM

## 2018-09-22 DIAGNOSIS — G4700 Insomnia, unspecified: Secondary | ICD-10-CM

## 2018-09-22 DIAGNOSIS — E559 Vitamin D deficiency, unspecified: Secondary | ICD-10-CM | POA: Insufficient documentation

## 2018-09-22 DIAGNOSIS — F418 Other specified anxiety disorders: Secondary | ICD-10-CM | POA: Diagnosis not present

## 2018-09-22 MED ORDER — HYDROCHLOROTHIAZIDE 25 MG PO TABS: 25.0000 mg | ORAL_TABLET | Freq: Every day | ORAL | 3 refills | Status: DC

## 2018-09-22 MED ORDER — LOSARTAN POTASSIUM 100 MG PO TABS
100.0000 mg | ORAL_TABLET | Freq: Every day | ORAL | 3 refills | Status: DC
Start: 2018-09-22 — End: 2019-03-24

## 2018-09-22 MED ORDER — VITAMIN D3 1.25 MG (50000 UT) PO CAPS: 1.0000 | ORAL_CAPSULE | ORAL | 1 refills | Status: DC

## 2018-09-22 NOTE — Assessment & Plan Note (Signed)
On Losartan and HCTZ 

## 2018-09-22 NOTE — Assessment & Plan Note (Signed)
Not on meds 

## 2018-09-22 NOTE — Assessment & Plan Note (Signed)
Vit D q 1 mo

## 2018-09-22 NOTE — Assessment & Plan Note (Addendum)
Clonazepam was recommended - d/c Trazodone low dose prn - d/c

## 2018-09-22 NOTE — Progress Notes (Signed)
Subjective:  Patient ID: Sydney [name], female    DOB: June 01, 1966  Age: 52 y.o. MRN: [id number]  CC: No chief complaint on file.   HPI Toniqua Melamed presents for 6 mo f/u HTN, insomnia, Vit D def  Outpatient Medications Prior to Visit  Medication Sig Dispense Refill  . Biotin 1000 MCG tablet Take 1 tablet (1 mg total) by mouth daily. 90 tablet 3  . cholecalciferol (VITAMIN D) 1000 UNITS tablet Take 1 tablet (1,000 Units total) by mouth daily. 90 tablet 3  . Cholecalciferol (VITAMIN D3) 50000 units CAPS Take 1 capsule by mouth once a week. 6 capsule 8  . ferrous sulfate 325 (65 FE) MG EC tablet Take 1 tablet (325 mg total) by mouth daily with breakfast. 90 tablet 3  . fish oil-omega-3 fatty acids 1000 MG capsule Take 1 g by mouth daily.    . hydrochlorothiazide (HYDRODIURIL) 25 MG tablet Take 1 tablet (25 mg total) by mouth daily. 90 tablet 3  . hydrocortisone 1 % lotion Apply 1 application topically daily. 59 mL 0  . ibuprofen (ADVIL,MOTRIN) 200 MG tablet Take 200 mg by mouth daily as needed.    . loratadine (CLARITIN) 10 MG tablet Take 1 tablet (10 mg total) by mouth daily. 90 tablet 0  . losartan (COZAAR) 100 MG tablet Take 1 tablet (100 mg total) by mouth daily. 90 tablet 3  . pyridoxine (B-6) 200 MG tablet Take 1 tablet (200 mg total) by mouth daily. 90 tablet 2  . triamcinolone cream (KENALOG) 0.1 % Apply 1 application topically 2 (two) times daily. 30 g 0  . valACYclovir (VALTREX) 1000 MG tablet Take 1 tablet (1,000 mg total) by mouth 3 (three) times daily. (Patient taking differently: Take 1,000 mg by mouth 3 (three) times daily as needed. ) 21 tablet 0  . vitamin A 10000 UNIT capsule Take 1 capsule (10,000 Units total) by mouth daily. 90 capsule 3  . vitamin B-12 (CYANOCOBALAMIN) 1000 MCG tablet Take 1 tablet (1,000 mcg total) by mouth daily. 90 tablet 3  . Vitamins C E (VITAMIN C & E COMBINATION) 500-400 MG-UNIT CAPS Take 1 capsule daily 90 each 3   No facility-administered  medications prior to visit.     ROS: Review of Systems  Constitutional: Negative for activity change, appetite change, chills, fatigue and unexpected weight change.  HENT: Negative for congestion, mouth sores and sinus pressure.   Eyes: Negative for visual disturbance.  Respiratory: Negative for cough and chest tightness.   Gastrointestinal: Negative for abdominal pain and nausea.  Genitourinary: Negative for difficulty urinating, frequency and vaginal pain.  Musculoskeletal: Negative for back pain and gait problem.  Skin: Negative for pallor and rash.  Neurological: Negative for dizziness, tremors, weakness, numbness and headaches.  Psychiatric/Behavioral: Negative for confusion, sleep disturbance and suicidal ideas. The patient is nervous/anxious.     Objective:  BP 118/78 (BP Location: Left Arm, Patient Position: Sitting, Cuff Size: Normal)   Pulse (!) 55   Temp 98.3 F (36.8 C) (Oral)   Ht 5\' 2"  (1.575 m)   Wt 171 lb (77.6 kg)   LMP 05/04/2012   SpO2 98%   BMI 31.28 kg/m   BP Readings from Last 3 Encounters:  09/22/18 118/78  03/24/18 120/74  09/30/17 118/76    Wt Readings from Last 3 Encounters:  09/22/18 171 lb (77.6 kg)  03/24/18 180 lb (81.6 kg)  09/30/17 181 lb (82.1 kg)    Physical Exam  Constitutional: She appears well-developed. No distress.  HENT:  Head: Normocephalic.  Right Ear: External ear normal.  Left Ear: External ear normal.  Nose: Nose normal.  Mouth/Throat: Oropharynx is clear and moist.  Eyes: Pupils are equal, round, and reactive to light. Conjunctivae are normal. Right eye exhibits no discharge. Left eye exhibits no discharge.  Neck: Normal range of motion. Neck supple. No JVD present. No tracheal deviation present. No thyromegaly present.  Cardiovascular: Normal rate, regular rhythm and normal heart sounds.  Pulmonary/Chest: No stridor. No respiratory distress. She has no wheezes.  Abdominal: Soft. Bowel sounds are normal. She exhibits  no distension and no mass. There is no tenderness. There is no rebound and no guarding.  Musculoskeletal: She exhibits no edema or tenderness.  Lymphadenopathy:    She has no cervical adenopathy.  Neurological: She displays normal reflexes. No cranial nerve deficit. She exhibits normal muscle tone. Coordination normal.  Skin: No rash noted. No erythema.  Psychiatric: She has a normal mood and affect. Her behavior is normal. Judgment and thought content normal.    Lab Results  Component Value Date   WBC 4.4 03/24/2018   HGB 13.8 03/24/2018   HCT 41.0 03/24/2018   PLT 294.0 03/24/2018   GLUCOSE 89 03/24/2018   CHOL 167 03/24/2018   TRIG 153.0 (H) 03/24/2018   HDL 69.50 03/24/2018   LDLCALC 67 03/24/2018   ALT 19 03/24/2018   AST 19 03/24/2018   NA 139 03/24/2018   K 3.7 03/24/2018   CL 99 03/24/2018   CREATININE 0.72 03/24/2018   BUN 11 03/24/2018   CO2 32 03/24/2018   TSH 1.61 03/24/2018   HGBA1C 6.1 10/13/2010    Dg Knee Bilateral Standing Ap  Result Date: 01/03/2016 CLINICAL DATA:  History of torn meniscus on the right in 2015, now with pain and left knee soreness, no known injury. EXAM: BILATERAL KNEES STANDING - 1 VIEW COMPARISON:  There no previous studies for review. FINDINGS: Weight-bearing views of both knees reveal the bones to be adequately mineralized. On the left there is mild narrowing of the medial joint compartment. There are osteophytes arising from the articular margins of the medial femoral condyle and medial tibial plateau. There is beaking of the tibial spines. The lateral joint space appears preserved. The proximal fibula is intact. On the right there is mild narrowing of the medial joint compartment. Osteophytes arise from the articular margins of the medial femoral condyle and medial tibial plateau. There is beaking of the tibial spines. The lateral joint space is preserved. The proximal fibula is unremarkable. IMPRESSION: There is moderate osteoarthritic  change centered on the medial joint compartments and the tibial spines bilaterally. No acute bony abnormality is observed. Electronically Signed   By: David  Martinique M.D.   On: 01/03/2016 14:41    Assessment & Plan:   There are no diagnoses linked to this encounter.   No orders of the defined types were placed in this encounter.    Follow-up: No follow-ups on file.  Walker Kehr, MD

## 2018-09-29 DIAGNOSIS — E119 Type 2 diabetes mellitus without complications: Secondary | ICD-10-CM | POA: Diagnosis not present

## 2018-09-29 DIAGNOSIS — H5210 Myopia, unspecified eye: Secondary | ICD-10-CM | POA: Diagnosis not present

## 2018-09-30 LAB — HM DIABETES EYE EXAM

## 2018-10-01 DIAGNOSIS — K080 Exfoliation of teeth due to systemic causes: Secondary | ICD-10-CM | POA: Diagnosis not present

## 2018-10-03 DIAGNOSIS — F411 Generalized anxiety disorder: Secondary | ICD-10-CM | POA: Diagnosis not present

## 2018-10-16 DIAGNOSIS — Z01419 Encounter for gynecological examination (general) (routine) without abnormal findings: Secondary | ICD-10-CM | POA: Diagnosis not present

## 2018-10-16 DIAGNOSIS — Z6831 Body mass index (BMI) 31.0-31.9, adult: Secondary | ICD-10-CM | POA: Diagnosis not present

## 2018-10-16 DIAGNOSIS — Z1231 Encounter for screening mammogram for malignant neoplasm of breast: Secondary | ICD-10-CM | POA: Diagnosis not present

## 2018-10-17 ENCOUNTER — Encounter: Payer: Self-pay | Admitting: Internal Medicine

## 2018-10-20 NOTE — Progress Notes (Signed)
[name] [address] [place], [place] [address] Phone: [phone] Subjective:    I Sydney [name] am serving as a Education administrator for Dr. Hulan Saas.    CC: Hand pain  GYI:[id number]  Sydney [name] is a 52 y.o. female coming in with complaint of hand pain. Hand is sore and the soreness will not go away. Loss of strength. Family history of carpal tunnel.   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 Hypertension    No past surgical history on file. Social History   Socioeconomic History  . Marital status: Single    Spouse name: Not on file  . Number of children: Not on file  . Years of education: Not on file  . Highest education level: Not on file  Occupational History  . Not on file  Social Needs  . Financial resource strain: Not on file  . Food insecurity:    Worry: Not on file    Inability: Not on file  . Transportation needs:    Medical: Not on file    Non-medical: Not on file  Tobacco Use  . Smoking status: Never Smoker  . Smokeless tobacco: Never Used  Substance and Sexual Activity  . Alcohol use: No  . Drug use: No  . Sexual activity: Not on file  Lifestyle  . Physical activity:    Days per week: Not on file    Minutes per session: Not on file  . Stress: Not on file  Relationships  . Social connections:    Talks on phone: Not on file    Gets together: Not on file    Attends religious service: Not on file    Active member of club or organization: Not on file    Attends meetings of clubs or organizations: Not on file    Relationship status: Not on file  Other Topics Concern  . Not on file  Social History Narrative  . Not on file   Allergies  Allergen Reactions  . Shellfish Allergy Hives    Facial urticaria  . Enalapril Maleate      REACTION: cough   No family history on file.   Current Outpatient Medications (Cardiovascular):  .  hydrochlorothiazide (HYDRODIURIL) 25 MG tablet, Take 1 tablet (25 mg total) by mouth daily. Marland Kitchen  losartan (COZAAR) 100 MG tablet, Take 1 tablet (100 mg total) by mouth daily.  Current Outpatient Medications (Respiratory):  .  loratadine (CLARITIN) 10 MG tablet, Take 1 tablet (10 mg total) by mouth daily.  Current Outpatient Medications (Analgesics):  .  ibuprofen (ADVIL,MOTRIN) 200 MG tablet, Take 200 mg by mouth daily as needed.  Current Outpatient Medications (Hematological):  .  ferrous sulfate 325 (65 FE) MG EC tablet, Take 1 tablet (325 mg total) by mouth daily with breakfast. .  vitamin B-12 (CYANOCOBALAMIN) 1000 MCG tablet, Take 1 tablet (1,000 mcg total) by mouth daily.  Current Outpatient Medications (Other):  .  Biotin 1000 MCG tablet, Take 1 tablet (1 mg total) by mouth daily. .  Cholecalciferol (VITAMIN D3) 50000 units CAPS, Take 1 capsule by mouth every 30 (thirty) days. .  fish oil-omega-3 fatty acids 1000 MG capsule, Take 1 g by mouth daily. .  hydrocortisone 1 % lotion, Apply 1 application topically daily. Marland Kitchen  pyridoxine (B-6) 200  MG tablet, Take 1 tablet (200 mg total) by mouth daily. Marland Kitchen  triamcinolone cream (KENALOG) 0.1 %, Apply 1 application topically 2 (two) times daily. .  valACYclovir (VALTREX) 1000 MG tablet, Take 1 tablet (1,000 mg total) by mouth 3 (three) times daily. (Patient taking differently: Take 1,000 mg by mouth 3 (three) times daily as needed. ) .  vitamin A 10000 UNIT capsule, Take 1 capsule (10,000 Units total) by mouth daily. .  Vitamins C E (VITAMIN C & E COMBINATION) 500-400 MG-UNIT CAPS, Take 1 capsule daily .  gabapentin (NEURONTIN) 100 MG capsule, Take 2 capsules (200 mg total) by mouth at bedtime.    Past medical history, social, surgical and family history all reviewed in electronic medical record.  No pertanent information unless stated  regarding to the chief complaint.   Review of Systems:  No headache, visual changes, nausea, vomiting, diarrhea, constipation, dizziness, abdominal pain, skin rash, fevers, chills, night sweats, weight loss, swollen lymph nodes, body aches, joint swelling, muscle aches, chest pain, shortness of breath, mood changes.   Objective  Blood pressure 120/90, pulse (!) 57, height 5\' 2"  (1.575 m), weight 168 lb (76.2 kg), last menstrual period 05/04/2012, SpO2 97 %.   General: No apparent distress alert and oriented x3 mood and affect normal, dressed appropriately.  HEENT: Pupils equal, extraocular movements intact  Respiratory: Patient's speak in full sentences and does not appear short of breath  Cardiovascular: No lower extremity edema, non tender, no erythema  Skin: Warm dry intact with no signs of infection or rash on extremities or on axial skeleton.  Abdomen: Soft nontender  Neuro: Cranial nerves II through XII are intact, neurovascularly intact in all extremities with 2+ DTRs and 2+ pulses.  Lymph: No lymphadenopathy of posterior or anterior cervical chain or axillae bilaterally.  Gait normal with good balance and coordination.  MSK:  Non tender with full range of motion and good stability and symmetric strength and tone of shoulders, elbows, , hip, knee and ankles bilaterally.  Right hand pain has no significant abnormality.  Patient does have tenderness over the [facility] joint.  Positive grind test.  Negative Finkelstein's test.  Full range of motion of the hand otherwise.  Negative Tinel's.  Limited musculoskeletal ultrasound was performed and interpreted by Sydney [name]   Limited ultrasound on patient's [facility] joint shows some mild to moderate arthritis with mild synovitis.  Patient has very mild enlargement of the median nerve area but no significant hypoechoic changes around it.  Normal first compartment on the dorsal aspect of the wrist is unremarkable as well. Impression: [facility]MC arthritis mild  to moderate   Impression and Recommendations:     This case required medical decision making of moderate complexity. The above documentation has been reviewed and is accurate and complete Sydney Pulley, DO       Note: This dictation was prepared with Dragon dictation along with smaller phrase technology. Any transcriptional errors that result from this process are unintentional.

## 2018-10-21 ENCOUNTER — Ambulatory Visit: Payer: Federal, State, Local not specified - PPO | Admitting: Family Medicine

## 2018-10-21 ENCOUNTER — Ambulatory Visit: Payer: Self-pay

## 2018-10-21 ENCOUNTER — Encounter: Payer: Self-pay | Admitting: Family Medicine

## 2018-10-21 VITALS — BP 120/90 | HR 57 | Ht 62.0 in | Wt 168.0 lb

## 2018-10-21 DIAGNOSIS — M19049 Primary osteoarthritis, unspecified hand: Secondary | ICD-10-CM | POA: Insufficient documentation

## 2018-10-21 DIAGNOSIS — M79641 Pain in right hand: Secondary | ICD-10-CM | POA: Diagnosis not present

## 2018-10-21 MED ORDER — GABAPENTIN 100 MG PO CAPS: 200.0000 mg | ORAL_CAPSULE | Freq: Every day | ORAL | 3 refills | Status: DC

## 2018-10-21 NOTE — Assessment & Plan Note (Signed)
Right side mild bracing given, home exercises, icing regimen, topical anti-inflammatories and vitamin D supplementation.  Gabapentin given night secondary to the mild enlargement of the median nerve.  Follow-up again in 4 to 6 weeks

## 2018-10-21 NOTE — Patient Instructions (Addendum)
Good to see you  Ice is your friend Exercises 3 times a week.  pennsaid pinkie amount topically 2 times daily as needed.  Gabapentin 200mg  at night Try the wrist brace day and night for 1 week then nightly for 2 weeks  OK to lift in brace or anything that does not load the thumb See me again in 4-6 weeks

## 2018-10-23 DIAGNOSIS — K080 Exfoliation of teeth due to systemic causes: Secondary | ICD-10-CM | POA: Diagnosis not present

## 2018-11-03 ENCOUNTER — Telehealth: Payer: Self-pay

## 2018-11-03 NOTE — Telephone Encounter (Signed)
Spoke with patient as she brought her thumb brace back to office. Was going to provider her with 1-800 number for [facility] but patient states that she has pink receipt.  instructed her to call them today in order to get a refund for brace. Patient voices understanding.

## 2018-11-17 ENCOUNTER — Telehealth: Payer: Self-pay | Admitting: Internal Medicine

## 2018-11-17 DIAGNOSIS — Z713 Dietary counseling and surveillance: Secondary | ICD-10-CM | POA: Diagnosis not present

## 2018-11-17 NOTE — Telephone Encounter (Signed)
Please advise, pt is currently not on any medications for insomnia

## 2018-11-17 NOTE — Telephone Encounter (Signed)
Copied from [place] [id number]. Topic: Quick Communication - See Telephone Encounter >> Nov 17, 2018 10:25 AM Conception Chancy, NT wrote: CRM for notification. See Telephone encounter for: 11/17/18.  Patient is calling and states she has been diagnosed with insomnia and that her pharmacy used to not carry the generic version of Sonata but she states that they do now and she would like the generic version of this called in. Please advise.  [place], [place] - [address] [place] [place] [address] Phone: [phone] Fax: [phone]

## 2018-11-18 MED ORDER — ZALEPLON 10 MG PO CAPS: 10.0000 mg | ORAL_CAPSULE | Freq: Every evening | ORAL | 1 refills | Status: DC | PRN

## 2018-11-18 NOTE — Telephone Encounter (Signed)
Done Sch f/u OV Thx

## 2018-11-18 NOTE — Telephone Encounter (Signed)
Please call pt to schedule.  Thank you

## 2018-11-19 NOTE — Telephone Encounter (Signed)
appt made

## 2018-11-23 NOTE — Progress Notes (Signed)
[name] [place], [place] [address] Phone: [phone] Subjective:   [name], am serving as a scribe for Dr. Hulan Saas.   CC: Back and neck pain follow-up  UXN:[id number]  Sydney [name] is a 52 y.o. female coming in with complaint of right wrist pain. No change in pain since last visit. Did wear brace for 2 weeks at night. No longer owns brace. Is doing exercises but they are not helping. Pain still over [facility] joint     Past Medical History:  Diagnosis Date  . Hypertension    No past surgical history on file. Social History   Socioeconomic History  . Marital status: Single    Spouse name: Not on file  . Number of children: Not on file  . Years of education: Not on file  . Highest education level: Not on file  Occupational History  . Not on file  Social Needs  . Financial resource strain: Not on file  . Food insecurity:    Worry: Not on file    Inability: Not on file  . Transportation needs:    Medical: Not on file    Non-medical: Not on file  Tobacco Use  . Smoking status: Never Smoker  . Smokeless tobacco: Never Used  Substance and Sexual Activity  . Alcohol use: No  . Drug use: No  . Sexual activity: Not on file  Lifestyle  . Physical activity:    Days per week: Not on file    Minutes per session: Not on file  . Stress: Not on file  Relationships  . Social connections:    Talks on phone: Not on file    Gets together: Not on file    Attends religious service: Not on file    Active member of club or organization: Not on file    Attends meetings of clubs or organizations: Not on file    Relationship status: Not on file  Other Topics Concern  . Not on file  Social History Narrative  . Not on file   Allergies  Allergen Reactions  . Shellfish Allergy Hives    Facial urticaria  . Enalapril Maleate     REACTION: cough   No family history on file.   Current Outpatient Medications  (Cardiovascular):  .  hydrochlorothiazide (HYDRODIURIL) 25 MG tablet, Take 1 tablet (25 mg total) by mouth daily. Marland Kitchen  losartan (COZAAR) 100 MG tablet, Take 1 tablet (100 mg total) by mouth daily.  Current Outpatient Medications (Respiratory):  .  loratadine (CLARITIN) 10 MG tablet, Take 1 tablet (10 mg total) by mouth daily.  Current Outpatient Medications (Analgesics):  .  ibuprofen (ADVIL,MOTRIN) 200 MG tablet, Take 200 mg by mouth daily as needed.  Current Outpatient Medications (Hematological):  .  ferrous sulfate 325 (65 FE) MG EC tablet, Take 1 tablet (325 mg total) by mouth daily with breakfast. .  vitamin B-12 (CYANOCOBALAMIN) 1000 MCG tablet, Take 1 tablet (1,000 mcg total) by mouth daily.  Current Outpatient Medications (Other):  .  Biotin 1000 MCG tablet, Take 1 tablet (1 mg total) by mouth daily. .  Cholecalciferol (VITAMIN D3) 50000 units CAPS, Take 1 capsule by mouth every 30 (thirty) days. .  fish oil-omega-3 fatty acids 1000 MG capsule, Take 1 g by mouth daily. Marland Kitchen  gabapentin (NEURONTIN) 100 MG capsule, Take 2 capsules (200 mg total) by mouth at bedtime. .  hydrocortisone 1 % lotion, Apply 1 application topically daily. Marland Kitchen  pyridoxine (B-6) 200 MG tablet, Take 1 tablet (200 mg total) by mouth daily. Marland Kitchen  triamcinolone cream (KENALOG) 0.1 %, Apply 1 application topically 2 (two) times daily. .  valACYclovir (VALTREX) 1000 MG tablet, Take 1 tablet (1,000 mg total) by mouth 3 (three) times daily. (Patient taking differently: Take 1,000 mg by mouth 3 (three) times daily as needed. ) .  vitamin A 10000 UNIT capsule, Take 1 capsule (10,000 Units total) by mouth daily. .  Vitamins C E (VITAMIN C & E COMBINATION) 500-400 MG-UNIT CAPS, Take 1 capsule daily .  zaleplon (SONATA) 10 MG capsule, Take 1 capsule (10 mg total) by mouth at bedtime as needed for sleep.    Past medical history, social, surgical and family history all reviewed in electronic medical record.  No pertanent  information unless stated regarding to the chief complaint.   Review of Systems:  No headache, visual changes, nausea, vomiting, diarrhea, constipation, dizziness, abdominal pain, skin rash, fevers, chills, night sweats, weight loss, swollen lymph nodes, body aches, joint swelling, muscle aches, chest pain, shortness of breath, mood changes.   Objective  Blood pressure 122/90, pulse 72, height 5\' 2"  (1.575 m), weight 171 lb (77.6 kg), last menstrual period 05/04/2012, SpO2 97 %.   General: No apparent distress alert and oriented x3 mood and affect normal, dressed appropriately.  HEENT: Pupils equal, extraocular movements intact  Respiratory: Patient's speak in full sentences and does not appear short of breath  Cardiovascular: No lower extremity edema, non tender, no erythema  Skin: Warm dry intact with no signs of infection or rash on extremities or on axial skeleton.  Abdomen: Soft nontender  Neuro: Cranial nerves II through XII are intact, neurovascularly intact in all extremities with 2+ DTRs and 2+ pulses.  Lymph: No lymphadenopathy of posterior or anterior cervical chain or axillae bilaterally.  Gait normal with good balance and coordination.  MSK:  Non tender with full range of motion and good stability and symmetric strength and tone of shoulders, elbows, wrist, hip, knee and ankles bilaterally.  Neck: Inspection mild loss of lordosis. No palpable stepoffs. Negative Spurling's maneuver. Full neck range of motion Grip strength and sensation normal in bilateral hands Strength good C4 to T1 distribution No sensory change to C4 to T1 Negative Hoffman sign bilaterally Reflexes normal  HEENT exam shows the patient does have tenderness to palpation over the [facility] joint.  Moderate to severe.  Back Exam:  Inspection: Loss of lordosis Motion: Flexion 35 deg, Extension 25 deg, Side Bending to 35 deg bilaterally,  Rotation to 45 deg bilaterally  SLR laying: Negative  XSLR laying:  Negative  Palpable tenderness: Tender to palpation on paraspinal musculature lumbar spine right greater than left. FABER: Tightness bilaterally. Sensory change: Gross sensation intact to all lumbar and sacral dermatomes.  Reflexes: 2+ at both patellar tendons, 2+ at achilles tendons, Babinski's downgoing.  Strength at foot  Plantar-flexion: 5/5 Dorsi-flexion: 5/5 Eversion: 5/5 Inversion: 5/5  Leg strength  Quad: 5/5 Hamstring: 5/5 Hip flexor: 5/5 Hip abductors: 4/5 but symmetric  Limited musculoskeletal ultrasound was performed and interpreted by Lyndal Pulley  Limited ultrasound shows that patient does have moderate arthritic changes.  Mild synovitis noted.  Some swelling of the joint space noted. Impression: [facility] arthritis   Impression and Recommendations:     This case required medical decision making of moderate complexity. The above documentation has been reviewed and is accurate and complete Lyndal Pulley, DO       Note: This  dictation was prepared with Dragon dictation along with smaller phrase technology. Any transcriptional errors that result from this process are unintentional.

## 2018-11-24 ENCOUNTER — Ambulatory Visit: Payer: Federal, State, Local not specified - PPO | Admitting: Family Medicine

## 2018-11-24 ENCOUNTER — Ambulatory Visit: Payer: Self-pay

## 2018-11-24 VITALS — BP 122/90 | HR 72 | Ht 62.0 in | Wt 171.0 lb

## 2018-11-24 DIAGNOSIS — M79641 Pain in right hand: Secondary | ICD-10-CM

## 2018-11-24 DIAGNOSIS — M19049 Primary osteoarthritis, unspecified hand: Secondary | ICD-10-CM

## 2018-11-24 NOTE — Assessment & Plan Note (Signed)
Patient declined any type of injection.  Patient custom brace made today.  Hopefully this will be beneficial.  We will allow her to have range of motion of the wrist.  Think that this will be more beneficial than patient.  Follow-up again in 4 to 6 weeks.

## 2018-11-24 NOTE — Patient Instructions (Signed)
Good to see you  Ice is your friend Try the brace when you can  Maybe a glove liner to keep it from getting yucky  See me again in 6 weeks

## 2018-12-01 DIAGNOSIS — J3089 Other allergic rhinitis: Secondary | ICD-10-CM | POA: Diagnosis not present

## 2018-12-02 DIAGNOSIS — K080 Exfoliation of teeth due to systemic causes: Secondary | ICD-10-CM | POA: Diagnosis not present

## 2018-12-08 ENCOUNTER — Telehealth: Payer: Self-pay

## 2018-12-08 NOTE — Telephone Encounter (Signed)
Spoke with patient who returned her thumb spica brace today. Told her that she needed to also call [facility] today before 5 to initiate return. Provided patient with number. Patient voices understanding.

## 2018-12-18 ENCOUNTER — Ambulatory Visit: Payer: Federal, State, Local not specified - PPO | Admitting: Internal Medicine

## 2018-12-25 ENCOUNTER — Ambulatory Visit (INDEPENDENT_AMBULATORY_CARE_PROVIDER_SITE_OTHER): Payer: Federal, State, Local not specified - PPO

## 2018-12-25 ENCOUNTER — Encounter: Payer: Self-pay | Admitting: Sports Medicine

## 2018-12-25 ENCOUNTER — Ambulatory Visit: Payer: Federal, State, Local not specified - PPO | Admitting: Sports Medicine

## 2018-12-25 VITALS — BP 110/82 | HR 70 | Ht 62.0 in | Wt 171.0 lb

## 2018-12-25 DIAGNOSIS — M25521 Pain in right elbow: Secondary | ICD-10-CM

## 2018-12-25 DIAGNOSIS — M25421 Effusion, right elbow: Secondary | ICD-10-CM | POA: Diagnosis not present

## 2018-12-25 MED ORDER — CYCLOBENZAPRINE HCL 5 MG PO TABS: 5.0000 mg | ORAL_TABLET | Freq: Every day | ORAL | 1 refills | Status: DC

## 2018-12-25 NOTE — Progress Notes (Signed)
Sydney [name]. Sydney [name], [place] [facility] [place] - 53 y.o. female MRN [id number]  Date of birth: 1966/02/09  Visit Date: 12/25/2018   PCP: Cassandria Anger, MD   Referred by: Cassandria Anger, MD  SUBJECTIVE:  Chief Complaint  Patient presents with  . Right Arm - Initial Assessment    [place] 12/22/18 while skating. Has tried IBU.    HPI: Patient presents after an injury after rollerskating where she stepped off of the rollerskating rink and fell over some cords that were underneath the carpet.  She landed directly on her right arm.  She had immediate pain and swelling stiffness and numbness and tingling.  She has had a hard time fully extending her elbow since that time.  The pain is in the wrist and elbow.  Is worsened with pressing her fingers opening and closing.  Is worse with dressing and palpation of the entire arm.  She has minimal relief with heat but does have some relief with elevation.  She does get some improvement as well with resting her arm up on a pillow at night but this is not keeping her awake at night.  REVIEW OF SYSTEMS: Per HPI  HISTORY:  Prior history reviewed and updated per electronic medical record.  Social History   Occupational History  . Not on file  Tobacco Use  . Smoking status: Never Smoker  . Smokeless tobacco: Never Used  Substance and Sexual Activity  . Alcohol use: No  . Drug use: No  . Sexual activity: Not on file   Social History   Social History Narrative  . Not on file   Past Medical History:  Diagnosis Date  . Hypertension    No past surgical history on file. family history is not on file.  DATA OBTAINED & REVIEWED:  Recent Labs    03/24/18 1457  CALCIUM 9.7  AST 19  ALT 19  TSH 1.61    No problems updated. No specialty comments available. X-rays reviewed the patient today that show a small joint effusion without evidence of fracture.   Radiographically occult fracture was discussed with the patient but low likelihood and likely would be radial neck if present.  OBJECTIVE:  VS:  HT:5\' 2"  (157.5 cm)   WT:171 lb (77.6 kg)  BMI:31.27    BP:110/82  HR:70bpm  TEMP: ( )  RESP:96 %   PHYSICAL EXAM: CONSTITUTIONAL: Well-developed, Well-nourished and In no acute distress EYES: Pupils are equal., EOM intact without nystagmus. and No scleral icterus. Psychiatric: Alert & appropriately interactive. and Not depressed or anxious appearing. EXTREMITY EXAM: Warm and well perfused  Right arm is well aligned with slight extension limitation of approximately 10 degrees.  She has some pain with terminal supination as well but this is mild until becomes in range.  She has no significant bruising or ecchymosis and no significant swelling of the entire arm.  Her grip strength and arm flexion/extension strength are intact and pain-free.   ASSESSMENT   1. Right elbow pain      PROCEDURES:  None  PLAN:  Pertinent additional documentation may be included in corresponding procedure notes, imaging studies, problem based documentation and patient instructions.  No problem-specific Assessment & Plan notes found for this encounter.  Unclear etiology of the exact cause of pain.  Given the slight x-ray abnormality if she does not have any improvement over the next 7 to 10 days would plan to have  her follow-up for repeat imaging however she is improved dramatically over the last day and anticipate she will continue to improve.  Gentle range of motion discussed to continue working on and if any worsening symptoms follow-up recommended.  Activity modifications and the importance of avoiding exacerbating activities (limiting pain to no more than a 4 / 10 during or following activity) recommended and discussed. Discussed red flag symptoms that warrant earlier emergent evaluation and patient voices understanding.   Meds ordered this encounter    Medications  . cyclobenzaprine (FLEXERIL) 5 MG tablet    Sig: Take 1 tablet (5 mg total) by mouth at bedtime.    Dispense:  30 tablet    Refill:  1   Return if symptoms worsen or fail to improve.          Gerda Diss, [place] [facility] Physician

## 2018-12-27 ENCOUNTER — Encounter: Payer: Self-pay | Admitting: Sports Medicine

## 2018-12-29 NOTE — Progress Notes (Signed)
Called pt x 2, stopped ringing, no one responded.

## 2018-12-30 NOTE — Progress Notes (Signed)
See CRM.  

## 2019-01-05 ENCOUNTER — Ambulatory Visit: Payer: Federal, State, Local not specified - PPO | Admitting: Family Medicine

## 2019-01-06 ENCOUNTER — Ambulatory Visit: Payer: Federal, State, Local not specified - PPO | Admitting: Internal Medicine

## 2019-01-06 DIAGNOSIS — L668 Other cicatricial alopecia: Secondary | ICD-10-CM | POA: Diagnosis not present

## 2019-01-19 ENCOUNTER — Ambulatory Visit: Payer: Federal, State, Local not specified - PPO | Admitting: Internal Medicine

## 2019-01-26 ENCOUNTER — Ambulatory Visit: Payer: Federal, State, Local not specified - PPO | Admitting: Internal Medicine

## 2019-01-26 ENCOUNTER — Encounter: Payer: Self-pay | Admitting: Internal Medicine

## 2019-01-26 DIAGNOSIS — I10 Essential (primary) hypertension: Secondary | ICD-10-CM | POA: Diagnosis not present

## 2019-01-26 DIAGNOSIS — J069 Acute upper respiratory infection, unspecified: Secondary | ICD-10-CM

## 2019-01-26 DIAGNOSIS — S5001XD Contusion of right elbow, subsequent encounter: Secondary | ICD-10-CM | POA: Diagnosis not present

## 2019-01-26 DIAGNOSIS — Z713 Dietary counseling and surveillance: Secondary | ICD-10-CM | POA: Diagnosis not present

## 2019-01-26 DIAGNOSIS — K219 Gastro-esophageal reflux disease without esophagitis: Secondary | ICD-10-CM | POA: Diagnosis not present

## 2019-01-26 DIAGNOSIS — B9789 Other viral agents as the cause of diseases classified elsewhere: Secondary | ICD-10-CM

## 2019-01-26 DIAGNOSIS — S5000XA Contusion of unspecified elbow, initial encounter: Secondary | ICD-10-CM | POA: Insufficient documentation

## 2019-01-26 DIAGNOSIS — E559 Vitamin D deficiency, unspecified: Secondary | ICD-10-CM

## 2019-01-26 MED ORDER — VITAMIN D3 50 MCG (2000 UT) PO CAPS: 2000.0000 [IU] | ORAL_CAPSULE | Freq: Every day | ORAL | 3 refills | Status: DC

## 2019-01-26 MED ORDER — SACCHAROMYCES BOULARDII 250 MG PO CAPS: 250.0000 mg | ORAL_CAPSULE | Freq: Two times a day (BID) | ORAL | 1 refills | Status: DC

## 2019-01-26 MED ORDER — MELOXICAM 15 MG PO TABS: 15.0000 mg | ORAL_TABLET | Freq: Every day | ORAL | 0 refills | Status: DC

## 2019-01-26 MED ORDER — PANTOPRAZOLE SODIUM 40 MG PO TBEC: 40.0000 mg | DELAYED_RELEASE_TABLET | Freq: Every day | ORAL | 3 refills | Status: DC

## 2019-01-26 NOTE — Progress Notes (Signed)
Subjective:  Patient ID: Sydney [name], female    DOB: 01-15-1966  Age: 53 y.o. MRN: [id number]  CC: No chief complaint on file.   HPI Sydney [name] presents for R arm injury - seeing dr Paulla Fore F/u HTN, insomnia C/o ST, cough, diarrhea since last week - better...  Outpatient Medications Prior to Visit  Medication Sig Dispense Refill  . cyclobenzaprine (FLEXERIL) 5 MG tablet Take 1 tablet (5 mg total) by mouth at bedtime. 30 tablet 1  . ferrous sulfate 325 (65 FE) MG EC tablet Take 1 tablet (325 mg total) by mouth daily with breakfast. 90 tablet 3  . gabapentin (NEURONTIN) 100 MG capsule Take 2 capsules (200 mg total) by mouth at bedtime. 60 capsule 3  . hydrochlorothiazide (HYDRODIURIL) 25 MG tablet Take 1 tablet (25 mg total) by mouth daily. 90 tablet 3  . ibuprofen (ADVIL,MOTRIN) 200 MG tablet Take 200 mg by mouth daily as needed.    . loratadine (CLARITIN) 10 MG tablet Take 1 tablet (10 mg total) by mouth daily. 90 tablet 0  . losartan (COZAAR) 100 MG tablet Take 1 tablet (100 mg total) by mouth daily. 90 tablet 3  . zaleplon (SONATA) 10 MG capsule Take 1 capsule (10 mg total) by mouth at bedtime as needed for sleep. 30 capsule 1   No facility-administered medications prior to visit.     ROS: Review of Systems  Constitutional: Negative for activity change, appetite change, chills, fatigue and unexpected weight change.  HENT: Negative for congestion, mouth sores and sinus pressure.   Eyes: Negative for visual disturbance.  Respiratory: Negative for cough and chest tightness.   Gastrointestinal: Positive for abdominal pain. Negative for nausea.       Cramping  Genitourinary: Negative for difficulty urinating, frequency and vaginal pain.  Musculoskeletal: Negative for back pain and gait problem.  Skin: Negative for pallor and rash.  Neurological: Negative for dizziness, tremors, weakness, numbness and headaches.  Psychiatric/Behavioral: Negative for confusion and sleep  disturbance.    Objective:  BP 122/82 (BP Location: Left Arm, Patient Position: Sitting, Cuff Size: Normal)   Pulse 77   Temp 98.6 F (37 C) (Oral)   Ht 5\' 2"  (1.575 m)   Wt 170 lb (77.1 kg)   LMP 05/04/2012   SpO2 95%   BMI 31.09 kg/m   BP Readings from Last 3 Encounters:  01/26/19 122/82  12/25/18 110/82  11/24/18 122/90    Wt Readings from Last 3 Encounters:  01/26/19 170 lb (77.1 kg)  12/25/18 171 lb (77.6 kg)  11/24/18 171 lb (77.6 kg)    Physical Exam Constitutional:      General: She is not in acute distress.    Appearance: She is well-developed.  HENT:     Head: Normocephalic.     Right Ear: External ear normal.     Left Ear: External ear normal.     Nose: Nose normal.  Eyes:     General:        Right eye: No discharge.        Left eye: No discharge.     Conjunctiva/sclera: Conjunctivae normal.     Pupils: Pupils are equal, round, and reactive to light.  Neck:     Musculoskeletal: Normal range of motion and neck supple.     Thyroid: No thyromegaly.     Vascular: No JVD.     Trachea: No tracheal deviation.  Cardiovascular:     Rate and Rhythm: Normal rate and regular rhythm.  Heart sounds: Normal heart sounds.  Pulmonary:     Effort: No respiratory distress.     Breath sounds: No stridor. No wheezing.  Abdominal:     General: Bowel sounds are normal. There is no distension.     Palpations: Abdomen is soft. There is no mass.     Tenderness: There is no abdominal tenderness. There is no guarding or rebound.  Musculoskeletal:        General: No tenderness.  Lymphadenopathy:     Cervical: No cervical adenopathy.  Skin:    Findings: No erythema or rash.  Neurological:     Cranial Nerves: No cranial nerve deficit.     Motor: No abnormal muscle tone.     Coordination: Coordination normal.     Deep Tendon Reflexes: Reflexes normal.  Psychiatric:        Behavior: Behavior normal.        Thought Content: Thought content normal.        Judgment:  Judgment normal.    Unable to extend R arm- pain   Lab Results  Component Value Date   WBC 4.4 03/24/2018   HGB 13.8 03/24/2018   HCT 41.0 03/24/2018   PLT 294.0 03/24/2018   GLUCOSE 89 03/24/2018   CHOL 167 03/24/2018   TRIG 153.0 (H) 03/24/2018   HDL 69.50 03/24/2018   LDLCALC 67 03/24/2018   ALT 19 03/24/2018   AST 19 03/24/2018   NA 139 03/24/2018   K 3.7 03/24/2018   CL 99 03/24/2018   CREATININE 0.72 03/24/2018   BUN 11 03/24/2018   CO2 32 03/24/2018   TSH 1.61 03/24/2018   HGBA1C 6.1 10/13/2010    Dg Knee Bilateral Standing Ap  Result Date: 01/03/2016 CLINICAL DATA:  History of torn meniscus on the right in 2015, now with pain and left knee soreness, no known injury. EXAM: BILATERAL KNEES STANDING - 1 VIEW COMPARISON:  There no previous studies for review. FINDINGS: Weight-bearing views of both knees reveal the bones to be adequately mineralized. On the left there is mild narrowing of the medial joint compartment. There are osteophytes arising from the articular margins of the medial femoral condyle and medial tibial plateau. There is beaking of the tibial spines. The lateral joint space appears preserved. The proximal fibula is intact. On the right there is mild narrowing of the medial joint compartment. Osteophytes arise from the articular margins of the medial femoral condyle and medial tibial plateau. There is beaking of the tibial spines. The lateral joint space is preserved. The proximal fibula is unremarkable. IMPRESSION: There is moderate osteoarthritic change centered on the medial joint compartments and the tibial spines bilaterally. No acute bony abnormality is observed. Electronically Signed   By: David  Martinique M.D.   On: 01/03/2016 14:41    Assessment & Plan:   There are no diagnoses linked to this encounter.   No orders of the defined types were placed in this encounter.    Follow-up: No follow-ups on file.  Walker Kehr, MD

## 2019-01-26 NOTE — Patient Instructions (Addendum)
Rice sock 

## 2019-01-26 NOTE — Assessment & Plan Note (Signed)
OTC meds 

## 2019-01-26 NOTE — Assessment & Plan Note (Signed)
Protonix Florastor

## 2019-01-26 NOTE — Assessment & Plan Note (Signed)
F/u w/Dr Aloha Gell - rice bag Meloxicam

## 2019-01-26 NOTE — Assessment & Plan Note (Signed)
Re-start Vit D 

## 2019-01-26 NOTE — Assessment & Plan Note (Signed)
Losartan and HCTZ

## 2019-02-04 ENCOUNTER — Ambulatory Visit: Payer: Federal, State, Local not specified - PPO | Admitting: Sports Medicine

## 2019-02-04 ENCOUNTER — Ambulatory Visit (INDEPENDENT_AMBULATORY_CARE_PROVIDER_SITE_OTHER): Payer: Federal, State, Local not specified - PPO

## 2019-02-04 ENCOUNTER — Encounter: Payer: Self-pay | Admitting: Sports Medicine

## 2019-02-04 VITALS — BP 110/78 | HR 71 | Ht 62.0 in | Wt 171.6 lb

## 2019-02-04 DIAGNOSIS — M25521 Pain in right elbow: Secondary | ICD-10-CM

## 2019-02-04 DIAGNOSIS — S52121A Displaced fracture of head of right radius, initial encounter for closed fracture: Secondary | ICD-10-CM | POA: Diagnosis not present

## 2019-02-04 DIAGNOSIS — S52124D Nondisplaced fracture of head of right radius, subsequent encounter for closed fracture with routine healing: Secondary | ICD-10-CM

## 2019-02-04 NOTE — Progress Notes (Signed)
Sydney [name]. Sydney [name], [place] [facility] [place] - 53 y.o. female MRN [id number]  Date of birth: 14-May-1966  Visit Date: February 11, 2019  PCP: Cassandria Anger, MD   Referred by: Cassandria Anger, MD  SUBJECTIVE:  Chief Complaint  Patient presents with  . Right Elbow - Follow-up  . Follow-up    R elbow and arm pain.  Flexeril, Gabapentin 100mg , Mobic    HPI: Patient is here with persistent arm and elbow pain.  She has had lack of improvement over the past several weeks.  She reports continued to have some stiffness and inability to fully extend her elbow.  She reports only minimal pain at this time.  Terminal extension and flexion is painful when she pushes it.  REVIEW OF SYSTEMS: No significant nighttime awakenings due to this issue. Denies fevers, chills, recent weight gain or weight loss.  No night sweats.  Pt denies any change in bowel or bladder habits, muscle weakness, numbness or falls associated with this pain.  HISTORY:  Prior history reviewed and updated per electronic medical record.  Patient Active Problem List   Diagnosis Date Noted  . GERD (gastroesophageal reflux disease) 01/26/2019    2/20 Protonix   . Elbow contusion 01/26/2019    1/20 R   . [place] arthritis 10/21/2018  . Vitamin D deficiency 09/22/2018    2019 Vit D q 1 mo   . Cough 02/04/2017  . Rash and nonspecific skin eruption 07/23/2016    11/17 face - ?food allergy   . Well adult exam 06/25/2016    We discussed age appropriate health related issues, including available/recomended screening tests and vaccinations. We discussed a need for adhering to healthy diet and exercise. Labs/EKG were reviewed/ordered. All questions were answered.     . Situational anxiety 04/02/2016    Seeing a therapist 2018 Clonazepam was recommended - d/c Trazodone low dose prn - d/c   . Viral URI with cough 01/23/2016    6/17 laryngitis,  2/20   . Patellofemoral syndrome of both knees 01/03/2016  . Weight gain 12/28/2015    1/17 Trazodone is not making her eat more 10/18 Dr Leafy Ro   . Weakness generalized 05/24/2015    6/16 ?"pseudohypoglycemia" vs other   . Nonallopathic lesion of lumbosacral region 05/16/2015  . Nonallopathic lesion of thoracic region 05/16/2015  . SI (sacroiliac) joint dysfunction 03/28/2015  . Nonallopathic lesion of sacral region 03/28/2015  . Impingement syndrome of left shoulder 03/28/2015    Mild   . Leg cramping 09/29/2014  . Acute meniscal tear of right knee 07/28/2014  . Insomnia 03/13/2013    4/14  New - stress related  Potential benefits of a long/short term benzodiazepines  use as well as potential risks  and complications were explained to the patient and were aknowledged. 6/15 declined Ambien, SSRIs 2017 Nortriptyline  2018  Trazodone to 100 mg at hs - d/c   . Blurred vision 03/13/2013    4/14 not new   . NECK PAIN 07/27/2009    Qualifier: Diagnosis of  By: Plotnikov MD, [place] PAIN 07/27/2009    Qualifier: Diagnosis of  By: Plotnikov MD, Evie Lacks    . FIBROIDS, UTERUS 11/18/2007    Qualifier: Diagnosis of  By: Plotnikov MD, Evie Lacks    . Essential hypertension 11/18/2007    Chronic  On Losartan and HCTZ    Social History  Occupational History  . Not on file  Tobacco Use  . Smoking status: Never Smoker  . Smokeless tobacco: Never Used  Substance and Sexual Activity  . Alcohol use: No  . Drug use: No  . Sexual activity: Not on file   Social History   Social History Narrative  . Not on file    OBJECTIVE:  VS:  HT:5\' 2"  (157.5 cm)   WT:171 lb 9.6 oz (77.8 kg)  BMI:31.38    BP:110/78  HR:71bpm  TEMP: ( )  RESP:96 %   PHYSICAL EXAM: Adult female. No acute distress.  Alert and appropriate. Is overall well aligned she has moderate amount of pain over her radial head.  There is limitations in supination pronation compared to  the contralateral side.  Arc is from 5 degrees to 160 degrees.  She is able to just touch the deltoid with terminal flexion.  X-rays reviewed that do show a nondisplaced radial head fracture   ASSESSMENT:  1. Right elbow pain   2. Closed nondisplaced fracture of head of right radius with routine healing, subsequent encounter     PROCEDURES:  None  PLAN:  Pertinent additional documentation may be included in corresponding procedure notes, imaging studies, problem based documentation and patient instructions.  No problem-specific Assessment & Plan notes found for this encounter.   Ultimately she does have a nondisplaced radial head fracture.  The management for this is continued conservative management we discussed that she will continue to have some ongoing stiffness and pain over the next several weeks.  She should see good improvements with this however and I am optimistic this will return to baseline.  We will plan to repeat x-rays at follow-up.  We did discuss the small possibility of nonunion and need for radial head excision given this is nondisplaced at this time avoidance of exacerbating activities is the only recommendation.  Activity modifications and the importance of avoiding exacerbating activities (limiting pain to no more than a 4 / 10 during or following activity) recommended and discussed.  Discussed red flag symptoms that warrant earlier emergent evaluation and patient voices understanding.   No orders of the defined types were placed in this encounter.  Lab Orders  No laboratory test(s) ordered today    Imaging Orders     DG ELBOW COMPLETE RIGHT (3+VIEW) Referral Orders  No referral(s) requested today    Return in about 4 weeks (around 03/04/2019) for repeat X-rays.          Gerda Diss, [place] [facility] Physician

## 2019-02-17 DIAGNOSIS — L668 Other cicatricial alopecia: Secondary | ICD-10-CM | POA: Diagnosis not present

## 2019-03-04 ENCOUNTER — Other Ambulatory Visit: Payer: Self-pay

## 2019-03-04 ENCOUNTER — Encounter: Payer: Self-pay | Admitting: Sports Medicine

## 2019-03-04 ENCOUNTER — Ambulatory Visit: Payer: Federal, State, Local not specified - PPO | Admitting: Sports Medicine

## 2019-03-04 ENCOUNTER — Ambulatory Visit (INDEPENDENT_AMBULATORY_CARE_PROVIDER_SITE_OTHER): Payer: Federal, State, Local not specified - PPO

## 2019-03-04 VITALS — HR 61 | Temp 97.3°F | Ht 62.0 in | Wt 174.6 lb

## 2019-03-04 DIAGNOSIS — S52124D Nondisplaced fracture of head of right radius, subsequent encounter for closed fracture with routine healing: Secondary | ICD-10-CM | POA: Diagnosis not present

## 2019-03-04 DIAGNOSIS — S5001XD Contusion of right elbow, subsequent encounter: Secondary | ICD-10-CM

## 2019-03-04 DIAGNOSIS — S52121D Displaced fracture of head of right radius, subsequent encounter for closed fracture with routine healing: Secondary | ICD-10-CM | POA: Diagnosis not present

## 2019-03-04 NOTE — Progress Notes (Signed)
Sydney [name]. [place], [place] [facility] [place] - 53 y.o. female MRN [id number]  Date of birth: 1966/11/16  Visit Date: March 05, 2019  PCP: Cassandria Anger, MD   Referred by: Cassandria Anger, MD  SUBJECTIVE:   Chief Complaint  Patient presents with  . Follow-up    R elbow/arm pain.  Radial head fracture.  Taking Flexeril, Mobic and Gabapentin.    HPI: Patient is here for follow-up of her right elbow and arm.  She continues to have an extensor lag but her flexor contracture has improved.  She reports only minimal symptoms day-to-day other than the lack of range of motion.  She is able to push up out of the chair without significant pain or discomfort.  She is not taking any medications at this time.  REVIEW OF SYSTEMS: No significant nighttime awakenings due to this issue. Denies fevers, chills, recent weight gain or weight loss.  No night sweats.  Pt denies any change in bowel or bladder habits, muscle weakness, numbness or falls associated with this pain.  HISTORY:  Prior history reviewed and updated per electronic medical record.  Patient Active Problem List   Diagnosis Date Noted  . GERD (gastroesophageal reflux disease) 01/26/2019    2/20 Protonix   . Elbow contusion 01/26/2019    R elbow XR - 12/25/18; 02/04/19   . [place] arthritis 10/21/2018  . Vitamin D deficiency 09/22/2018    2019 Vit D q 1 mo   . Cough 02/04/2017  . Rash and nonspecific skin eruption 07/23/2016    11/17 face - ?food allergy   . Well adult exam 06/25/2016    We discussed age appropriate health related issues, including available/recomended screening tests and vaccinations. We discussed a need for adhering to healthy diet and exercise. Labs/EKG were reviewed/ordered. All questions were answered.     . Situational anxiety 04/02/2016    Seeing a therapist 2018 Clonazepam was recommended - d/c Trazodone low dose prn - d/c    . Viral URI with cough 01/23/2016    6/17 laryngitis, 2/20   . Patellofemoral syndrome of both knees 01/03/2016  . Weight gain 12/28/2015    1/17 Trazodone is not making her eat more 10/18 Dr Leafy Ro   . Weakness generalized 05/24/2015    6/16 ?"pseudohypoglycemia" vs other   . Nonallopathic lesion of lumbosacral region 05/16/2015  . Nonallopathic lesion of thoracic region 05/16/2015  . SI (sacroiliac) joint dysfunction 03/28/2015  . Nonallopathic lesion of sacral region 03/28/2015  . Impingement syndrome of left shoulder 03/28/2015    Mild   . Leg cramping 09/29/2014  . Acute meniscal tear of right knee 07/28/2014  . Insomnia 03/13/2013    4/14  New - stress related  Potential benefits of a long/short term benzodiazepines  use as well as potential risks  and complications were explained to the patient and were aknowledged. 6/15 declined Ambien, SSRIs 2017 Nortriptyline  2018  Trazodone to 100 mg at hs - d/c   . Blurred vision 03/13/2013    4/14 not new   . NECK PAIN 07/27/2009    Qualifier: Diagnosis of  By: Plotnikov MD, [place] PAIN 07/27/2009    Qualifier: Diagnosis of  By: Plotnikov MD, Evie Lacks    . FIBROIDS, UTERUS 11/18/2007    Qualifier: Diagnosis of  By: Alain Marion MD, [place] hypertension 11/18/2007  Chronic  On Losartan and HCTZ    Social History   Occupational History  . Not on file  Tobacco Use  . Smoking status: Never Smoker  . Smokeless tobacco: Never Used  Substance and Sexual Activity  . Alcohol use: No  . Drug use: No  . Sexual activity: Not on file   Social History   Social History Narrative  . Not on file    OBJECTIVE:  VS:  HT:5\' 2"  (157.5 cm)   WT:174 lb 9.6 oz (79.2 kg)  BMI:31.93    BP:   HR:61bpm  TEMP:(!) 97.3 F (36.3 C)( )  RESP:97 %   PHYSICAL EXAM: Adult female. No acute distress.  Alert and appropriate. Right elbow continues to have a flexor contracture of  approximately 15 degrees at rest.  She has minimal pain with supination and pronation.  She is able to easily touch her shoulder with her right hand.  Minimal pain directly over the radial head.  X-rays reviewed today that are stable that do show a only mildly displaced radial head fracture    ASSESSMENT:   1. Closed nondisplaced fracture of head of right radius with routine healing, subsequent encounter   2. Contusion of right elbow, subsequent encounter     PROCEDURES:  None  PLAN:  Pertinent additional documentation may be included in corresponding procedure notes, imaging studies, problem based documentation and patient instructions.  No problem-specific Assessment & Plan notes found for this encounter.   This is continue to take quite some time to heal CT scan could be considered if any persistent ongoing symptoms but this is not available for emergent imaging due to the COVID-19 crisis.  We will plan to follow-up with her an additional 4 weeks and repeat x-rays at that time.  Anticipate the range of motion continuing to improve.  We did once again discussed the low likelihood but possibility of nonunion with this injury but anticipate only continued improvements over the next several office visits.   Activity modifications and the importance of avoiding exacerbating activities (limiting pain to no more than a 4 / 10 during or following activity) recommended and discussed.   Discussed red flag symptoms that warrant earlier emergent evaluation and patient voices understanding.    No orders of the defined types were placed in this encounter.  Lab Orders  No laboratory test(s) ordered today   Imaging Orders     DG ELBOW COMPLETE RIGHT (3+VIEW) Referral Orders  No referral(s) requested today    At follow up will plan to consider: repeat X-rays of Complete right elbow  Return in about 4 weeks (around 04/01/2019).          Gerda Diss, [facility]  Physician

## 2019-03-04 NOTE — Patient Instructions (Signed)
Please perform the exercise program that we have prepared for you and gone over in detail on a daily basis.  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

## 2019-03-05 ENCOUNTER — Encounter: Payer: Self-pay | Admitting: Sports Medicine

## 2019-03-23 ENCOUNTER — Telehealth: Payer: Self-pay | Admitting: Internal Medicine

## 2019-03-23 ENCOUNTER — Ambulatory Visit: Payer: Federal, State, Local not specified - PPO | Admitting: Internal Medicine

## 2019-03-23 NOTE — Telephone Encounter (Signed)
Copied from [place] [id number]. Topic: Quick Communication - Rx Refill/Question >> Mar 23, 2019  3:25 PM Sydney [name], [place] A wrote: Medication: losartan (COZAAR) 100 MG tablet ,hydrochlorothiazide (HYDRODIURIL) 25 MG tablet (Patient only has one pill left.)  Has the patient contacted their pharmacy? Yes (Agent: If no, request that the patient contact the pharmacy for the refill.) (Agent: If yes, when and what did the pharmacy advise?)Contact PCP  Preferred Pharmacy (with phone number or street name): [place], [place] - [address] [phone] (Phone) [phone] (Fax)    Agent: Please be advised that RX refills may take up to 3 business days. We ask that you follow-up with your pharmacy.

## 2019-03-24 MED ORDER — HYDROCHLOROTHIAZIDE 25 MG PO TABS: 25.0000 mg | ORAL_TABLET | Freq: Every day | ORAL | 3 refills | Status: DC

## 2019-03-24 MED ORDER — LOSARTAN POTASSIUM 100 MG PO TABS: 100.0000 mg | ORAL_TABLET | Freq: Every day | ORAL | 3 refills | Status: DC

## 2019-03-24 NOTE — Telephone Encounter (Signed)
RX sent

## 2019-03-30 DIAGNOSIS — Z713 Dietary counseling and surveillance: Secondary | ICD-10-CM | POA: Diagnosis not present

## 2019-04-01 ENCOUNTER — Ambulatory Visit: Payer: Federal, State, Local not specified - PPO | Admitting: Sports Medicine

## 2019-04-08 ENCOUNTER — Ambulatory Visit: Payer: Federal, State, Local not specified - PPO | Admitting: Family Medicine

## 2019-04-08 ENCOUNTER — Encounter: Payer: Self-pay | Admitting: Family Medicine

## 2019-04-08 ENCOUNTER — Ambulatory Visit: Payer: Self-pay

## 2019-04-08 ENCOUNTER — Ambulatory Visit (INDEPENDENT_AMBULATORY_CARE_PROVIDER_SITE_OTHER)
Admission: RE | Admit: 2019-04-08 | Discharge: 2019-04-08 | Disposition: A | Discharge status: Home / Self Care | Payer: Federal, State, Local not specified - PPO | Source: Ambulatory Visit | Attending: Family Medicine | Admitting: Family Medicine

## 2019-04-08 ENCOUNTER — Other Ambulatory Visit: Payer: Self-pay

## 2019-04-08 VITALS — BP 112/80 | Ht 62.0 in | Wt 174.0 lb

## 2019-04-08 DIAGNOSIS — M25422 Effusion, left elbow: Secondary | ICD-10-CM | POA: Diagnosis not present

## 2019-04-08 DIAGNOSIS — S52121A Displaced fracture of head of right radius, initial encounter for closed fracture: Secondary | ICD-10-CM | POA: Insufficient documentation

## 2019-04-08 DIAGNOSIS — S52124A Nondisplaced fracture of head of right radius, initial encounter for closed fracture: Secondary | ICD-10-CM

## 2019-04-08 DIAGNOSIS — M25521 Pain in right elbow: Secondary | ICD-10-CM

## 2019-04-08 DIAGNOSIS — S59902A Unspecified injury of left elbow, initial encounter: Secondary | ICD-10-CM | POA: Diagnosis not present

## 2019-04-08 NOTE — Assessment & Plan Note (Signed)
Patient did not first time I have seen this patient for this problem.  X-rays did show a radial head fracture.  Ultrasound today shows slow interval healing.  Patient is in minimal pain now and wants to increase range of motion.  I feel patient could start formal physical therapy will be referred.  We discussed icing regimen and home exercises.  Discussed the importance of trying to increase extension.  Worsening pain advanced imaging would be warranted.  Follow-up again in 4 weeks

## 2019-04-08 NOTE — Patient Instructions (Addendum)
Good to see you  Stay safe We will get xray downstairs Keep working on the range of motion  PT will be calling you  See me again in 4 weeks if not better

## 2019-04-08 NOTE — Progress Notes (Signed)
[name] [place], [place] [address] Phone: [phone] Subjective:   Sydney [name], am serving as a scribe for Dr. Hulan Saas.  I'm seeing this patient by the request  of:    CC: Elbow pain follow-up  AST:[id number]   11/24/2018: [place] jt arthritis, right; declined injection, brace given  Update 5/62020: Sydney [name] is a 53 y.o. female coming in with complaint of elbow pain.  Patient saw another provider.  Was diagnosed with a nondisplaced radial head fracture.  Patient's last x-rays were taken March 04, 2019.  Found a minimally displaced radial head fracture with an elbow effusion.  This was independently visualized by me. States that she is unable to fully extend her elbow. Is [name] longer having pain associated with the fracture.  Also having left knee pain. Has had swelling recently. [name] mechanism of injury. Pain was cramping feeling. Fluid over superior patella. [name] pain today.  Patient has had x-rays previously.  X-rays have showed the patient did have arthritic changes in 2017.  These were independently visualized by me     Past Medical History:  Diagnosis Date  . Hypertension    [name] past surgical history on file. Social History   Socioeconomic History  . Marital status: Single    Spouse name: Not on file  . Number of children: Not on file  . Years of education: Not on file  . Highest education level: Not on file  Occupational History  . Not on file  Social Needs  . Financial resource strain: Not on file  . Food insecurity:    Worry: Not on file    Inability: Not on file  . Transportation needs:    Medical: Not on file    Non-medical: Not on file  Tobacco Use  . Smoking status: Never Smoker  . Smokeless tobacco: Never Used  Substance and Sexual Activity  . Alcohol use: [name]  . Drug use: [name]  . Sexual activity: Not on file  Lifestyle  . Physical activity:    Days per week: Not on file    Minutes per session: Not on  file  . Stress: Not on file  Relationships  . Social connections:    Talks on phone: Not on file    Gets together: Not on file    Attends religious service: Not on file    Active member of club or organization: Not on file    Attends meetings of clubs or organizations: Not on file    Relationship status: Not on file  Other Topics Concern  . Not on file  Social History Narrative  . Not on file   Allergies  Allergen Reactions  . Shellfish Allergy Hives    Facial urticaria  . Enalapril Maleate     REACTION: cough   [name] family history on file.   Current Outpatient Medications (Cardiovascular):  .  hydrochlorothiazide (HYDRODIURIL) 25 MG tablet, Take 1 tablet (25 mg total) by mouth daily. Marland Kitchen  losartan (COZAAR) 100 MG tablet, Take 1 tablet (100 mg total) by mouth daily.  Current Outpatient Medications (Respiratory):  .  loratadine (CLARITIN) 10 MG tablet, Take 1 tablet (10 mg total) by mouth daily.  Current Outpatient Medications (Analgesics):  .  meloxicam (MOBIC) 15 MG tablet, Take 1 tablet (15 mg total) by mouth daily.  Current Outpatient Medications (Hematological):  .  ferrous sulfate 325 (65 FE) MG EC tablet, Take 1 tablet (325 mg total) by mouth daily  with breakfast.  Current Outpatient Medications (Other):  Marland Kitchen  Cholecalciferol (VITAMIN D3) 50 MCG (2000 UT) capsule, Take 1 capsule (2,000 Units total) by mouth daily. .  cyclobenzaprine (FLEXERIL) 5 MG tablet, Take 1 tablet (5 mg total) by mouth at bedtime. .  pantoprazole (PROTONIX) 40 MG tablet, Take 1 tablet (40 mg total) by mouth daily. Marland Kitchen  saccharomyces boulardii (FLORASTOR) 250 MG capsule, Take 1 capsule (250 mg total) by mouth 2 (two) times daily. .  zaleplon (SONATA) 10 MG capsule, Take 1 capsule (10 mg total) by mouth at bedtime as needed for sleep.    Past medical history, social, surgical and family history all reviewed in electronic medical record.  [name] pertanent information unless stated regarding to the chief  complaint.   Review of Systems:  [name] headache, visual changes, nausea, vomiting, diarrhea, constipation, dizziness, abdominal pain, skin rash, fevers, chills, night sweats, weight loss, swollen lymph nodes, body aches, joint swelling,  chest pain, shortness of breath, mood changes.  Positive muscle aches  Objective  Blood pressure 112/80, height 5\' 2"  (1.575 m), weight 174 lb (78.9 kg), last menstrual period 05/04/2012.   General: [name] apparent distress alert and oriented x3 mood and affect normal, dressed appropriately.  HEENT: Pupils equal, extraocular movements intact  Respiratory: Patient's speak in full sentences and does not appear short of breath  Cardiovascular: [name] lower extremity edema, non tender, [name] erythema  Skin: Warm dry intact with [name] signs of infection or rash on extremities or on axial skeleton.  Abdomen: Soft nontender  Neuro: Cranial nerves II through XII are intact, neurovascularly intact in all extremities with 2+ DTRs and 2+ pulses.  Lymph: [name] lymphadenopathy of posterior or anterior cervical chain or axillae bilaterally.  Gait normal with good balance and coordination.  MSK:  Non tender with full range of motion and good stability and symmetric strength and tone of shoulders, , wrist, hip, knee and ankles bilaterally.  Right knee exam shows the patient does not have any true swelling.  Has some near full supination and pronation.  Has full flexion but is lacking the last 15 degrees of extension.  Good grip strength.  Mild tenderness over the radial head.  Contralateral elbow unremarkable  Limited musculoskeletal ultrasound was performed and interpreted by Lyndal Pulley  Limited ultrasound of patient's elbow still shows a what appears to be a cortical defect of the radial head.  Possible depression but less than 1 mm.  Does seem to be intra-articular Impression: Slow interval healing possibly of the radial head    Impression and Recommendations:     This case required  medical decision making of moderate complexity. The above documentation has been reviewed and is accurate and complete Lyndal Pulley, DO       Note: This dictation was prepared with Dragon dictation along with smaller phrase technology. Any transcriptional errors that result from this process are unintentional.

## 2019-04-22 ENCOUNTER — Ambulatory Visit: Payer: Federal, State, Local not specified - PPO | Attending: Family Medicine | Admitting: Physical Therapy

## 2019-04-22 ENCOUNTER — Encounter: Payer: Self-pay | Admitting: Physical Therapy

## 2019-04-22 ENCOUNTER — Other Ambulatory Visit: Payer: Self-pay

## 2019-04-22 DIAGNOSIS — M6281 Muscle weakness (generalized): Secondary | ICD-10-CM | POA: Insufficient documentation

## 2019-04-22 DIAGNOSIS — M25521 Pain in right elbow: Secondary | ICD-10-CM | POA: Diagnosis not present

## 2019-04-22 DIAGNOSIS — M25621 Stiffness of right elbow, not elsewhere classified: Secondary | ICD-10-CM | POA: Diagnosis not present

## 2019-04-22 NOTE — Therapy (Signed)
[facility] [address], [place], [place], [address] Phone: [phone]   Fax:  [phone]  Physical Therapy Evaluation  Patient Details  Name: Sydney [name] MRN: [id number] Date of Birth: Mar 30, 1966 Referring Provider (PT): Lyndal Pulley, D   Encounter Date: 04/22/2019  PT End of Session - 04/22/19 1345    Visit Number  1    Date for PT Re-Evaluation  06/17/19    PT Start Time  1300    PT Stop Time  1345    PT Time Calculation (min)  45 min    Activity Tolerance  Patient tolerated treatment well    Behavior During Therapy  [facility] for tasks assessed/performed       Past Medical History:  Diagnosis Date  . Hypertension     History reviewed. No pertinent surgical history.  There were no vitals filed for this visit.   Subjective Assessment - 04/22/19 1303    Subjective  I can't do the usual exercises I do.  Any weight in my arm feels uncomfortable    Limitations  Lifting   weights   Patient Stated Goals  be able to return to exercise and get full ROM     Currently in Pain?  Yes    Pain Score  5    not now, but lifting weight has pain   Pain Location  Elbow    Pain Orientation  Right    Pain Descriptors / Indicators  Aching    Pain Radiating Towards  around the medial and lateral epicondyle    Pain Frequency  Intermittent    Aggravating Factors   lifting weight    Pain Relieving Factors  pain goes away on it's own immediately    Effect of Pain on Daily Activities  can't lift and exercise with weights, can't straighten arm         [facility] PT Assessment - 04/22/19 0001      Assessment   Medical Diagnosis  M25.521 (ICD-10-CM) - Right elbow pain    Referring Provider (PT)  Lyndal Pulley, D    Onset Date/Surgical Date  12/21/18    Hand Dominance  Right    Prior Therapy  No      Precautions   Precautions  None      Balance Screen   Has the patient fallen in the past 6 months  Yes    How many  times?  1 when roller skating ([place])      Home Environment   Living Environment  Private residence    Living Arrangements  Other relatives   nephew     Prior Function   Level of Independence  Independent    Vocation  Full time employment    Leisure  walking, dance      Cognition   Overall Cognitive Status  Within Functional Limits for tasks assessed      Observation/Other Assessments   Focus on Therapeutic Outcomes (FOTO)   32% limited   goal 26%     Posture/Postural Control   Posture/Postural Control  Postural limitations    Postural Limitations  Rounded Shoulders      ROM / Strength   AROM / PROM / Strength  AROM;PROM;Strength      AROM   Overall AROM Comments  Rt elbow -35 deg      PROM   Overall PROM Comments  Rt elbow -20 deg      Strength   Strength Assessment  Site  Elbow;Forearm    Right/Left Elbow  Right;Left    Right Elbow Flexion  5/5    Right Elbow Extension  4+/5    Left Elbow Flexion  5/5    Left Elbow Extension  5/5    Right/Left Forearm  --    Right/Left Wrist  Right;Left    Right Wrist Flexion  5/5    Right Wrist Extension  5/5    Left Wrist Flexion  5/5    Left Wrist Extension  5/5      Flexibility   Soft Tissue Assessment /Muscle Length  yes   biceps shortened     Palpation   Palpation comment  TTP radial head and distal to radial head about 2 inches; muscle spasms and trigger points along wrist flex and extensors                Objective measurements completed on examination: See above findings.      [place] Adult PT Treatment/Exercise - 04/22/19 0001      Self-Care   Self-Care  Other Self-Care Comments    Other Self-Care Comments   educated and performed initial HEP             PT Education - 04/22/19 1345    Education Details   Access Code: [email or web address] and dry needling info    Person(s) Educated  Patient    Methods  Explanation;Demonstration;Handout    Comprehension  Verbalized understanding;Returned demonstration        PT Short Term Goals - 04/22/19 1351      PT SHORT TERM GOAL #1   Title  Pt will demonstrate -10 deg PROM Rt elbow    Time  4    Period  Weeks    Status  New    Target Date  05/20/19      PT SHORT TERM GOAL #2   Title  Pt will have only minimal tenderness of radial head and shaft on right side with palpation due to less trigger points in forearm    Time  4    Period  Weeks    Status  New    Target Date  05/20/19        PT Long Term Goals - 04/22/19 1349      PT LONG TERM GOAL #1   Title  Pt will be able to straighten Rt elbow to 0 deg neutral for improved functional reaching overhead    Baseline  -35 deg AROM; -20 deg PROM    Time  8    Period  Weeks    Status  New    Target Date  06/17/19      PT LONG TERM GOAL #2   Title  Pt will be able to perform exercises with free weights such as tricep extension without increased pain    Baseline  5/10 pain and cannot perform    Time  8    Period  Weeks    Status  New    Target Date  06/17/19      PT LONG TERM GOAL #3   Title  Pt will be ind with advanced HEP    Time  8    Period  Weeks    Status  New    Target Date  06/17/19      PT LONG TERM GOAL #4   Title  Pt will report pain 2/10 at most during all typical activities.    Baseline  up to 5/10  Time  8    Period  Weeks    Status  New    Target Date  06/17/19             Plan - 04/22/19 1354    Clinical Impression Statement  Pt presents to clinic after a [place] at the roller rink in January 2020.  Pt has decreased elbow extension.  Pt has pain and discomfort when lifting heavy weights and has limited ability of her dominal UE. Pt will benefit from skilled PT to address weakness and ROM deficits as well as muscle spasm so she can return to normal functional activities without using compensations.      Stability/Clinical Decision Making  Stable/Uncomplicated    Clinical Decision Making  Low    Rehab Potential  Excellent    PT Frequency  2x / week    PT  Duration  8 weeks    PT Treatment/Interventions  ADLs/Self Care Home Management;Cryotherapy;Electrical Stimulation;Moist Heat;Manual techniques;Dry needling;Taping;Passive range of motion;Patient/family education;Therapeutic exercise;Therapeutic activities;Neuromuscular re-education    PT Next Visit Plan  dry needle forearm and biceps on Rt, elbow and wrist strength and ROM Rt UE    PT Home Exercise Plan   Access Code: [email or web address]     Consulted and Agree with Plan of Care  Patient       Patient will benefit from skilled therapeutic intervention in order to improve the following deficits and impairments:  Decreased range of motion, Decreased strength, Increased fascial restricitons, Pain, Impaired UE functional use, Postural dysfunction  Visit Diagnosis: Stiffness of right elbow, not elsewhere classified  Muscle weakness (generalized)  Pain in right elbow     Problem List Patient Active Problem List   Diagnosis Date Noted  . Fracture of radial head, right, closed 04/08/2019  . GERD (gastroesophageal reflux disease) 01/26/2019  . Elbow contusion 01/26/2019  . [place] arthritis 10/21/2018  . Vitamin D deficiency 09/22/2018  . Cough 02/04/2017  . Rash and nonspecific skin eruption 07/23/2016  . Well adult exam 06/25/2016  . Situational anxiety 04/02/2016  . Viral URI with cough 01/23/2016  . Patellofemoral syndrome of both knees 01/03/2016  . Weight gain 12/28/2015  . Weakness generalized 05/24/2015  . Nonallopathic lesion of lumbosacral region 05/16/2015  . Nonallopathic lesion of thoracic region 05/16/2015  . SI (sacroiliac) joint dysfunction 03/28/2015  . Nonallopathic lesion of sacral region 03/28/2015  . Impingement syndrome of left shoulder 03/28/2015  . Leg cramping 09/29/2014  . Acute meniscal tear of right knee 07/28/2014  . Insomnia 03/13/2013  . Blurred vision 03/13/2013  . NECK PAIN 07/27/2009  . CHEST WALL PAIN 07/27/2009  . FIBROIDS, UTERUS 11/18/2007  . Essential  hypertension 11/18/2007    Jule Ser, PT 04/22/2019, 4:56 PM  [facility] [address] [place], [place], [address] Phone: [phone]   Fax:  [phone]  Name: Shontelle Muska MRN: [id number] Date of Birth: Jun 03, 1966

## 2019-04-22 NOTE — Patient Instructions (Signed)
Trigger Point Dry Needling  . What is Trigger Point Dry Needling (DN)? o DN is a physical therapy technique used to treat muscle pain and dysfunction. Specifically, DN helps deactivate muscle trigger points (muscle knots).  o A thin filiform needle is used to penetrate the skin and stimulate the underlying trigger point. The goal is for a local twitch response (LTR) to occur and for the trigger point to relax. No medication of any kind is injected during the procedure.   . What Does Trigger Point Dry Needling Feel Like?  o The procedure feels different for each individual patient. Some patients report that they do not actually feel the needle enter the skin and overall the process is not painful. Very mild bleeding may occur. However, many patients feel a deep cramping in the muscle in which the needle was inserted. This is the local twitch response.   Marland Kitchen How Will I feel after the treatment? o Soreness is normal, and the onset of soreness may not occur for a few hours. Typically this soreness does not last longer than two days.  o Bruising is uncommon, however; ice can be used to decrease any possible bruising.  o In rare cases feeling tired or nauseous after the treatment is normal. In addition, your symptoms may get worse before they get better, this period will typically not last longer than 24 hours.   . What Can I do After My Treatment? o Increase your hydration by drinking more water for the next 24 hours. o You may place ice or heat on the areas treated that have become sore, however, do not use heat on inflamed or bruised areas. Heat often brings more relief post needling. o You can continue your regular activities, but vigorous activity is not recommended initially after the treatment for 24 hours. o DN is best combined with other physical therapy such as strengthening, stretching, and other therapies.    [facility] [address] [place], [place]  [address] Phone # [phone] Fax [phone] Access Code: [email or web address]  URL: [email or web address]  Date: 04/22/2019  Prepared by: Jari Favre   Exercises  Standing Elbow Extension with Towel Roll at [place] - 10 reps - 1 sets - 5 sec hold - 1x daily - 7x weekly  Standing Elbow Extension with Towel Roll at [place] - 10 reps - 1 sets - 1x daily - 7x weekly  Standing Overhead Elbow Extension - 10 reps - 1 sets - 5 sec hold - 1x daily - 7x weekly

## 2019-05-03 IMAGING — DX RIGHT ELBOW - COMPLETE 3+ VIEW
4 series · 4 of 4 positions shown · non-contrast
Comparison: 02/04/2019; 12/25/2018

CLINICAL DATA: Contusion of the right above

EXAM:
RIGHT ELBOW - COMPLETE 3+ VIEW

[elbow ap]
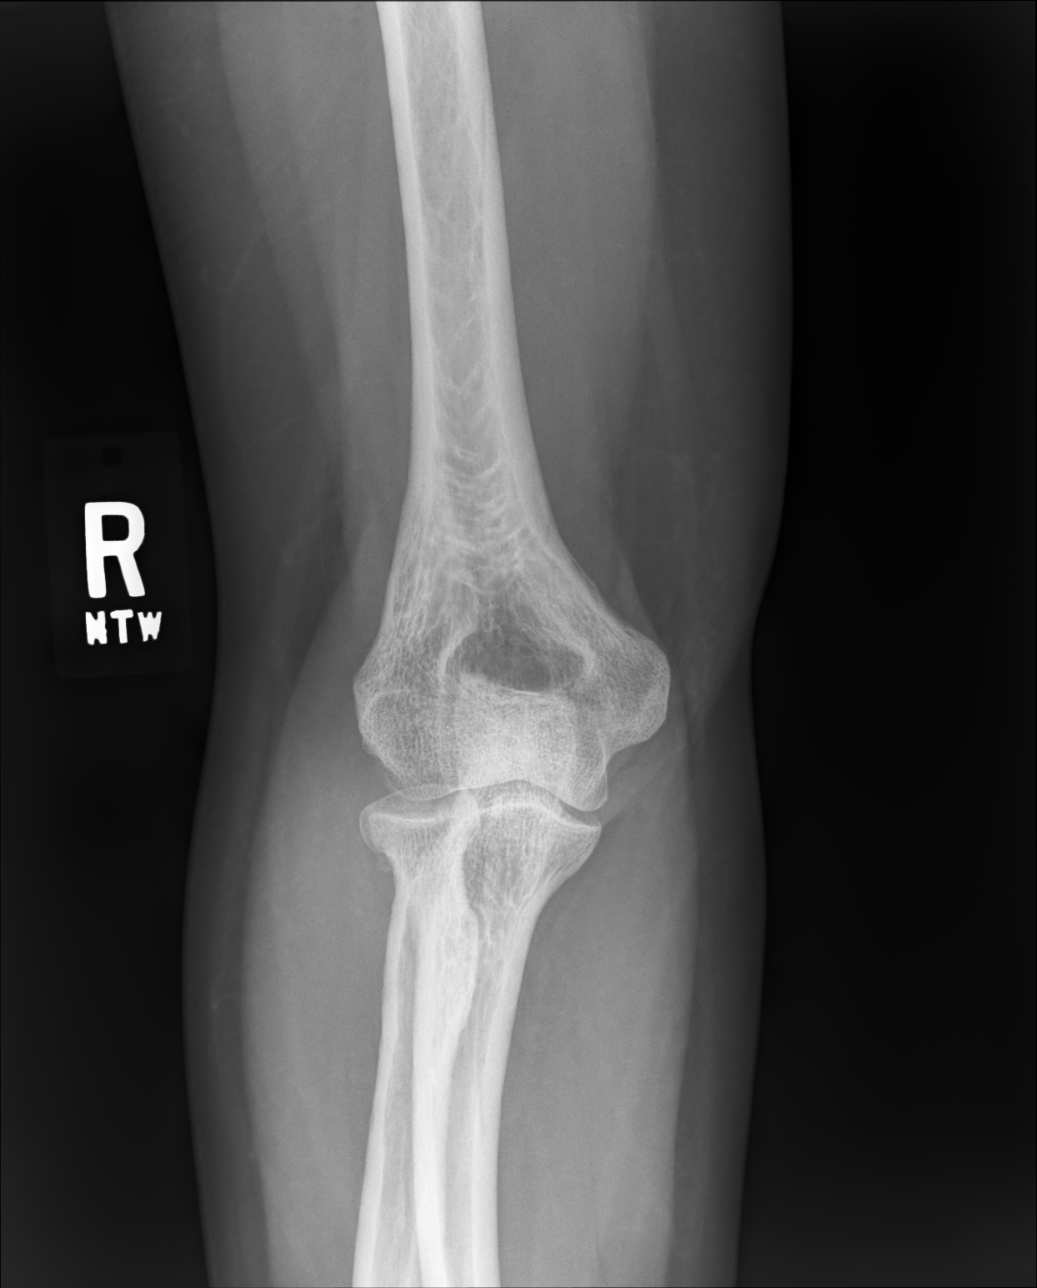

[elbow medial oblique]
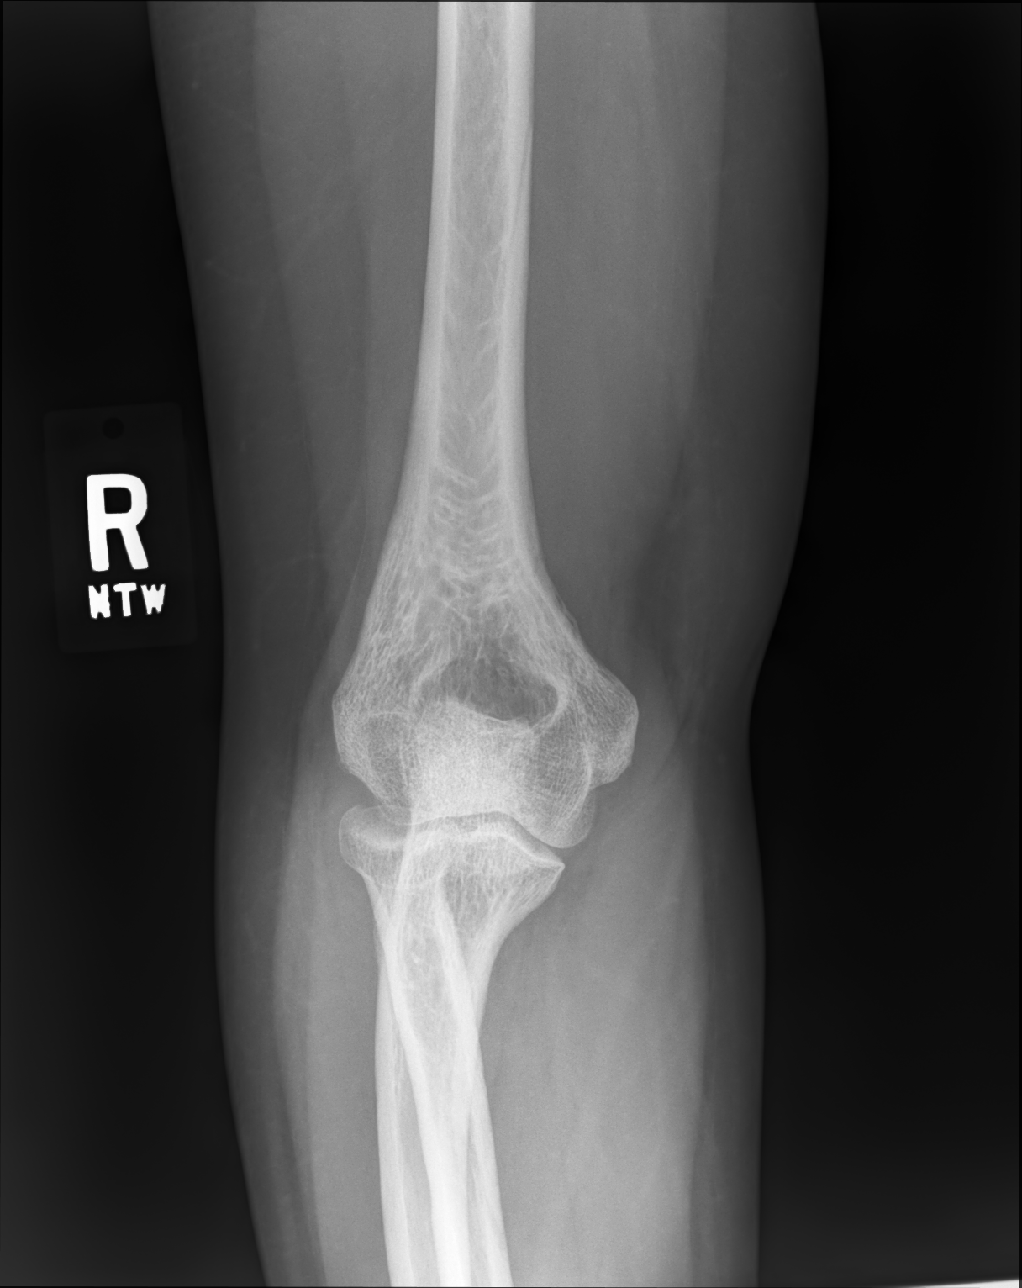

[elbow lateral oblique]
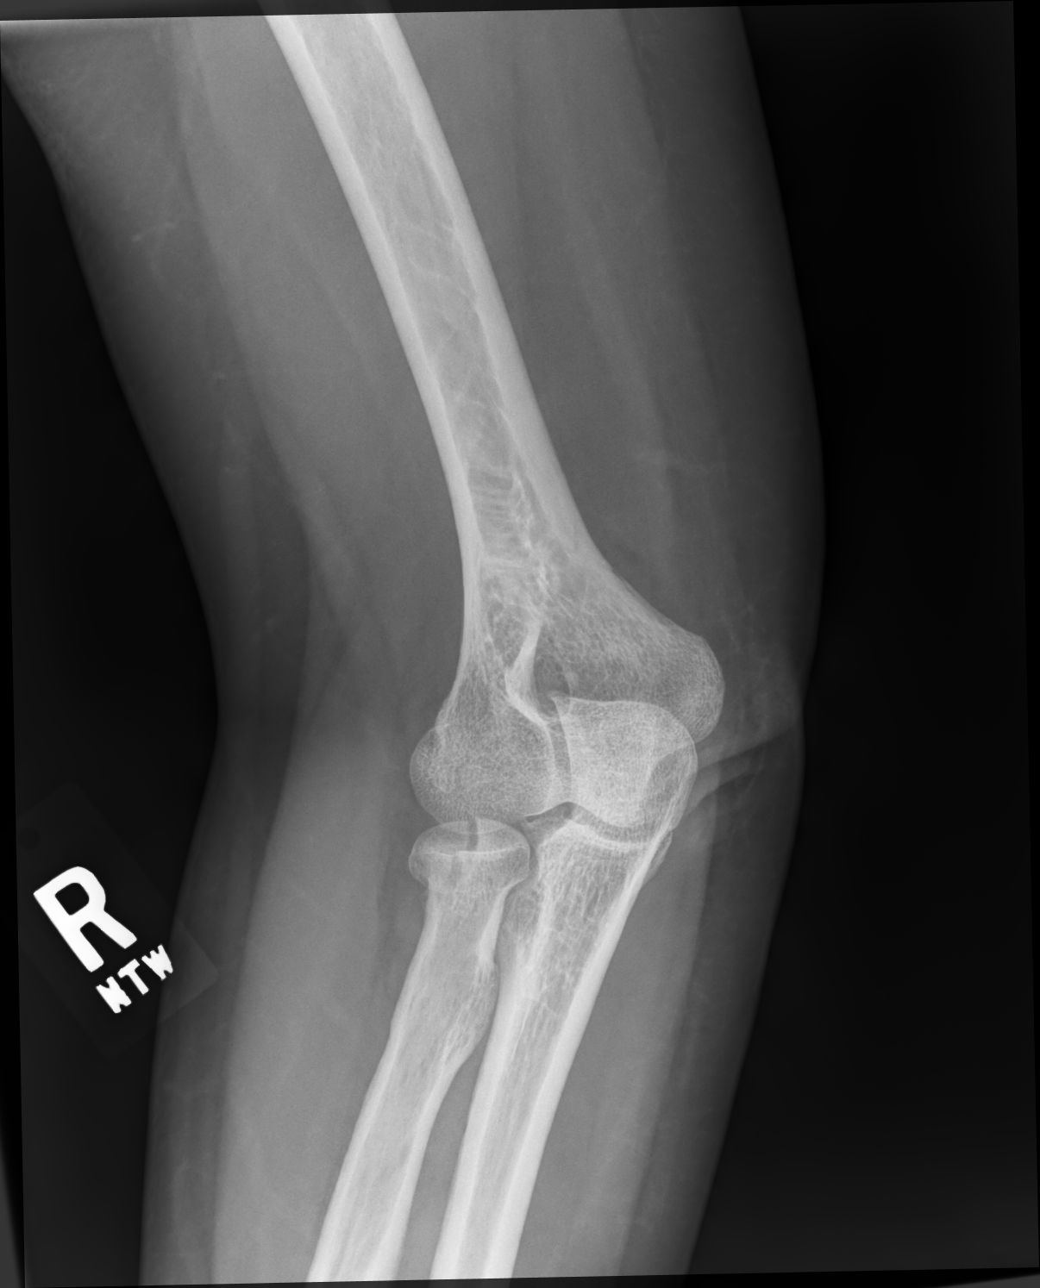

[elbow lat]
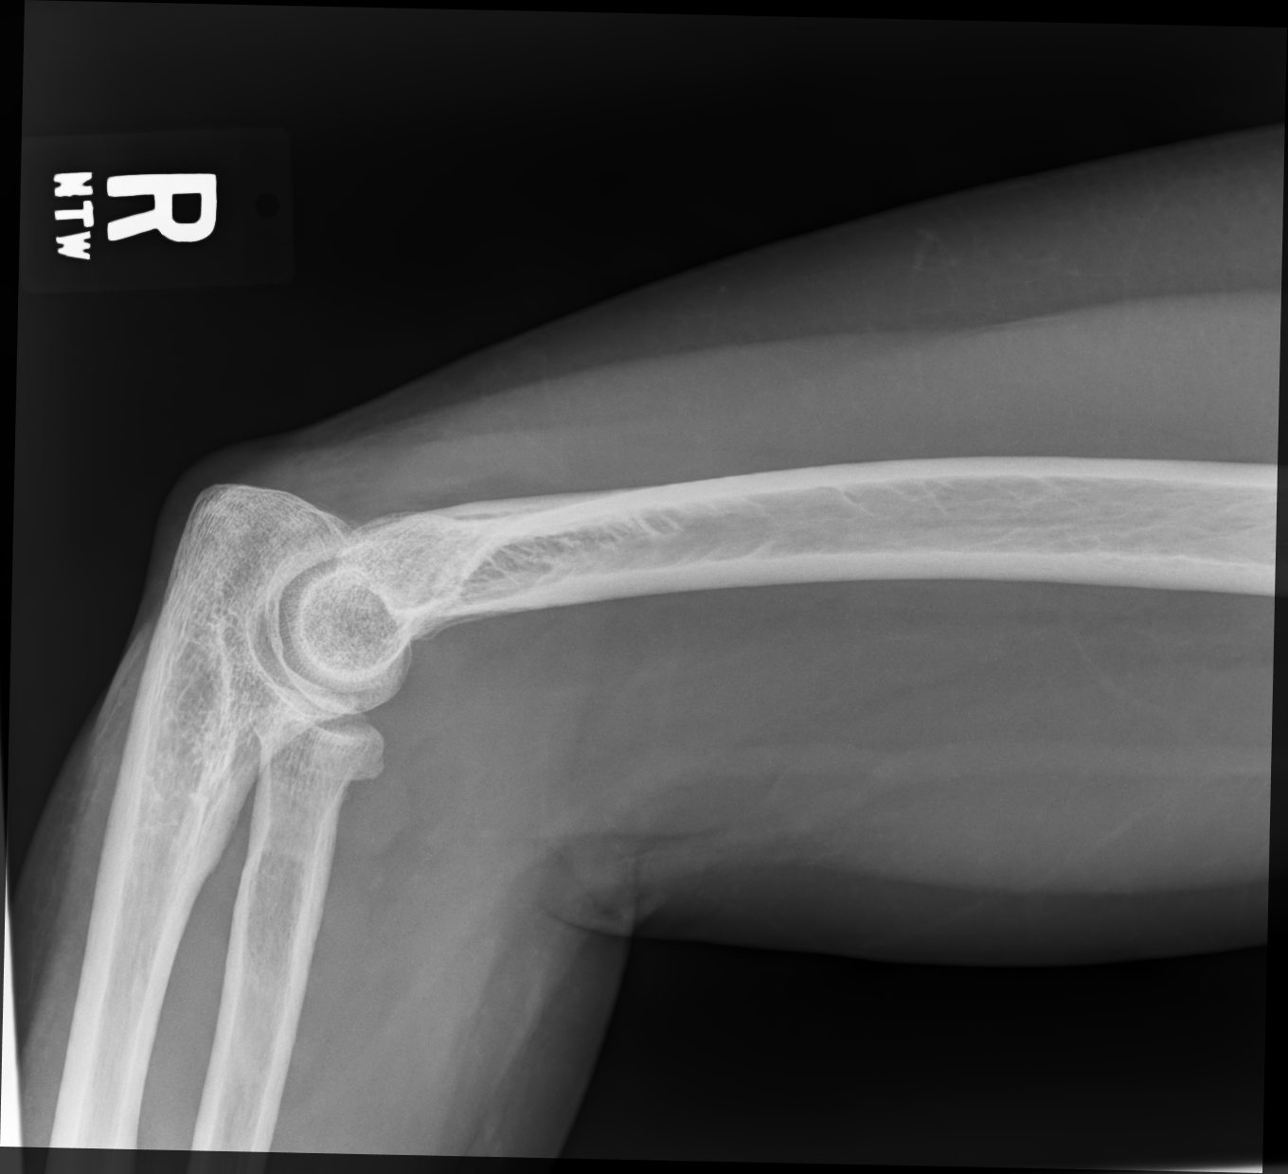

[4 of 4 positions shown; findings below may reference images not displayed]

FINDINGS: Grossly unchanged appearance and alignment known minimally displaced
radial head fracture. Fracture line is again noted to extend to the
radial humeral articulation with associated elbow joint effusion.

No new fractures identified.  No radiopaque foreign body.
IMPRESSION: Grossly unchanged appearance and alignment of known minimally
displaced radial head fracture and associated elbow joint effusion.

## 2019-05-06 ENCOUNTER — Ambulatory Visit: Payer: Federal, State, Local not specified - PPO | Attending: Family Medicine | Admitting: Physical Therapy

## 2019-05-06 ENCOUNTER — Other Ambulatory Visit: Payer: Self-pay

## 2019-05-06 ENCOUNTER — Ambulatory Visit: Payer: Federal, State, Local not specified - PPO | Admitting: Family Medicine

## 2019-05-06 ENCOUNTER — Encounter: Payer: Self-pay | Admitting: Physical Therapy

## 2019-05-06 DIAGNOSIS — M6281 Muscle weakness (generalized): Secondary | ICD-10-CM | POA: Insufficient documentation

## 2019-05-06 DIAGNOSIS — M25621 Stiffness of right elbow, not elsewhere classified: Secondary | ICD-10-CM | POA: Insufficient documentation

## 2019-05-06 DIAGNOSIS — M25521 Pain in right elbow: Secondary | ICD-10-CM | POA: Diagnosis not present

## 2019-05-06 NOTE — Patient Instructions (Signed)
Access Code: [email or web address]  URL: [email or web address]  Date: 05/06/2019  Prepared by: Jari Favre   Exercises  Standing Elbow Extension with Towel Roll at [place] - 10 reps - 1 sets - 5 sec hold - 1x daily - 7x weekly  Standing Elbow Extension with Towel Roll at [place] - 10 reps - 1 sets - 1x daily - 7x weekly  Standing Overhead Elbow Extension - 10 reps - 1 sets - 5 sec hold - 1x daily - 7x weekly  Shoulder Extension with Resistance - 10 reps - 3 sets - 1x daily - 7x weekly  Standing Bilateral Low Shoulder Row with Anchored Resistance - 10 reps - 3 sets - 1x daily - 7x weekly  Wall Push Up - 10 reps - 3 sets - 1x daily - 7x weekly

## 2019-05-06 NOTE — Therapy (Signed)
[facility] [address], [place], [place], [address] Phone: [phone]   Fax:  [phone]  Physical Therapy Treatment  Patient Details  Name: Sydney [name] MRN: [id number] Date of Birth: [birth year] Referring Provider (PT): Lyndal Pulley, D   Encounter Date: 05/06/2019  PT End of Session - 05/06/19 1322    Visit Number  2    Date for PT Re-Evaluation  06/17/19    PT Start Time  1233    PT Stop Time  1320    PT Time Calculation (min)  47 min    Activity Tolerance  Patient tolerated treatment well    Behavior During Therapy  [facility] for tasks assessed/performed       Past Medical History:  Diagnosis Date  . Hypertension     History reviewed. No pertinent surgical history.  There were no vitals filed for this visit.  Subjective Assessment - 05/06/19 1240    Subjective  It still feels tight. Denies pain    Patient Stated Goals  be able to return to exercise and get full ROM     Currently in Pain?  No/denies                       [facility] Adult PT Treatment/Exercise - 05/06/19 0001      Exercises   Exercises  Elbow;Shoulder;Wrist      Shoulder Exercises: Standing   Extension  Strengthening;Right;Left;20 reps;Theraband    Theraband Level (Shoulder Extension)  Level 1 (Yellow)    Row  Strengthening;Right;Left;20 reps;Theraband    Theraband Level (Shoulder Row)  Level 1 (Yellow)    Other Standing Exercises  wall push up 20x      Manual Therapy   Manual Therapy  Soft tissue mobilization;Passive ROM    Manual therapy comments  Rt forearm and bicep - trigger point release    Passive ROM  elbow extension             PT Education - 05/06/19 1322    Education Details   Access Code: [email or web address]     Person(s) Educated  Patient    Methods  Explanation;Demonstration;Tactile cues;Verbal cues;Handout    Comprehension  Verbalized understanding;Returned demonstration       PT Short Term Goals  - 05/06/19 1326      PT SHORT TERM GOAL #2   Title  Pt will have only minimal tenderness of radial head and shaft on right side with palpation due to less trigger points in forearm    Status  Achieved        PT Long Term Goals - 04/22/19 1349      PT LONG TERM GOAL #1   Title  Pt will be able to straighten Rt elbow to 0 deg neutral for improved functional reaching overhead    Baseline  -35 deg AROM; -20 deg PROM    Time  8    Period  Weeks    Status  New    Target Date  06/17/19      PT LONG TERM GOAL #2   Title  Pt will be able to perform exercises with free weights such as tricep extension without increased pain    Baseline  5/10 pain and cannot perform    Time  8    Period  Weeks    Status  New    Target Date  06/17/19      PT LONG TERM GOAL #3   Title  Pt will be ind with advanced HEP    Time  8    Period  Weeks    Status  New    Target Date  06/17/19      PT LONG TERM GOAL #4   Title  Pt will report pain 2/10 at most during all typical activities.    Baseline  up to 5/10    Time  8    Period  Weeks    Status  New    Target Date  06/17/19            Plan - 05/06/19 1323    Clinical Impression Statement  Pt responded well to manual therapy wthout increased pain.  She had some stiffness during the exercises.  She needs cues to keep shoulders in alignment.  Pt will benefit from skilled PT to continue to proress strength and ROM back to maximum function.    PT Treatment/Interventions  ADLs/Self Care Home Management;Cryotherapy;Electrical Stimulation;Moist Heat;Manual techniques;Dry needling;Taping;Passive range of motion;Patient/family education;Therapeutic exercise;Therapeutic activities;Neuromuscular re-education    PT Next Visit Plan  dry needle forearm and biceps on Rt, elbow and wrist strength and ROM Rt UE, f/u on addition to HEP    PT Home Exercise Plan   Access Code: [email or web address]     Consulted and Agree with Plan of Care  Patient       Patient will  benefit from skilled therapeutic intervention in order to improve the following deficits and impairments:  Decreased range of motion, Decreased strength, Increased fascial restricitons, Pain, Impaired UE functional use, Postural dysfunction  Visit Diagnosis: Stiffness of right elbow, not elsewhere classified  Muscle weakness (generalized)  Pain in right elbow     Problem List Patient Active Problem List   Diagnosis Date Noted  . Fracture of radial head, right, closed 04/08/2019  . GERD (gastroesophageal reflux disease) 01/26/2019  . Elbow contusion 01/26/2019  . [place] arthritis 10/21/2018  . Vitamin D deficiency 09/22/2018  . Cough 02/04/2017  . Rash and nonspecific skin eruption 07/23/2016  . Well adult exam 06/25/2016  . Situational anxiety 04/02/2016  . Viral URI with cough 01/23/2016  . Patellofemoral syndrome of both knees 01/03/2016  . Weight gain 12/28/2015  . Weakness generalized 05/24/2015  . Nonallopathic lesion of lumbosacral region 05/16/2015  . Nonallopathic lesion of thoracic region 05/16/2015  . SI (sacroiliac) joint dysfunction 03/28/2015  . Nonallopathic lesion of sacral region 03/28/2015  . Impingement syndrome of left shoulder 03/28/2015  . Leg cramping 09/29/2014  . Acute meniscal tear of right knee 07/28/2014  . Insomnia 03/13/2013  . Blurred vision 03/13/2013  . NECK PAIN 07/27/2009  . CHEST WALL PAIN 07/27/2009  . FIBROIDS, UTERUS 11/18/2007  . Essential hypertension 11/18/2007    Camillo Flaming Obdulio Mash 05/06/2019, 1:30 PM  [facility] [address], [place], [place], [address] Phone: [phone]   Fax:  [phone]  Name: Sydney [name] MRN: [id number] Date of Birth: [birth year]

## 2019-05-08 ENCOUNTER — Other Ambulatory Visit: Payer: Self-pay

## 2019-05-08 ENCOUNTER — Ambulatory Visit: Payer: Federal, State, Local not specified - PPO | Admitting: Physical Therapy

## 2019-05-08 ENCOUNTER — Encounter: Payer: Self-pay | Admitting: Physical Therapy

## 2019-05-08 DIAGNOSIS — M25521 Pain in right elbow: Secondary | ICD-10-CM

## 2019-05-08 DIAGNOSIS — M25621 Stiffness of right elbow, not elsewhere classified: Secondary | ICD-10-CM

## 2019-05-08 DIAGNOSIS — M6281 Muscle weakness (generalized): Secondary | ICD-10-CM | POA: Diagnosis not present

## 2019-05-08 NOTE — Therapy (Signed)
[facility] [address] [place], [place], [address] Phone: [phone]   Fax:  [phone]  Physical Therapy Treatment  Patient Details  Name: Sydney [name] MRN: [id number] Date of Birth: [birth year] Referring Provider (PT): Lyndal Pulley, D   Encounter Date: 05/08/2019  PT End of Session - 05/08/19 1531    Visit Number  3    Date for PT Re-Evaluation  06/17/19    PT Start Time  0109    PT Stop Time  1624    PT Time Calculation (min)  53 min    Activity Tolerance  Patient tolerated treatment well    Behavior During Therapy  [facility] for tasks assessed/performed       Past Medical History:  Diagnosis Date  . Hypertension     History reviewed. No pertinent surgical history.  There were no vitals filed for this visit.  Subjective Assessment - 05/08/19 1532    Subjective  Pt denies pain today.      Patient Stated Goals  be able to return to exercise and get full ROM     Currently in Pain?  No/denies         [facility] PT Assessment - 05/08/19 0001      AROM   Overall AROM Comments  -14                   [facility] Adult PT Treatment/Exercise - 05/08/19 0001      Elbow Exercises   Elbow Extension  Strengthening;Power Tower;Both;20 reps   one plate     Shoulder Exercises: Standing   External Rotation  Strengthening;Right;20 reps;Theraband    Theraband Level (Shoulder External Rotation)  Level 2 (Red)    Internal Rotation  Strengthening;Right;20 reps;Theraband    Theraband Level (Shoulder Internal Rotation)  Level 2 (Red)    Extension  Strengthening;Both;20 reps;Weights    Extension Weight (lbs)  15 lb - power tower    Extension Limitations  --    Row  Strengthening;Both;20 reps;Weights    Row Weight (lbs)  20 lb - power tower    Other Standing Exercises  wall push up 20x      Shoulder Exercises: ROM/Strengthening   Other ROM/Strengthening Exercises  Level 33 [place]anger in standing - Rt UE - 20x       Manual Therapy   Manual Therapy  Soft tissue mobilization;Passive ROM    Soft tissue mobilization  Rt forearm and bicep - trigger point release    Passive ROM  elbow extension               PT Short Term Goals - 05/06/19 1326      PT SHORT TERM GOAL #2   Title  Pt will have only minimal tenderness of radial head and shaft on right side with palpation due to less trigger points in forearm    Status  Achieved        PT Long Term Goals - 04/22/19 1349      PT LONG TERM GOAL #1   Title  Pt will be able to straighten Rt elbow to 0 deg neutral for improved functional reaching overhead    Baseline  -35 deg AROM; -20 deg PROM    Time  8    Period  Weeks    Status  New    Target Date  06/17/19      PT LONG TERM GOAL #2   Title  Pt will be able to  perform exercises with free weights such as tricep extension without increased pain    Baseline  5/10 pain and cannot perform    Time  8    Period  Weeks    Status  New    Target Date  06/17/19      PT LONG TERM GOAL #3   Title  Pt will be ind with advanced HEP    Time  8    Period  Weeks    Status  New    Target Date  06/17/19      PT LONG TERM GOAL #4   Title  Pt will report pain 2/10 at most during all typical activities.    Baseline  up to 5/10    Time  8    Period  Weeks    Status  New    Target Date  06/17/19            Plan - 05/08/19 1626    Clinical Impression Statement  Pt has improved significantly with elbow ext -14 down from -35 at eval.  She is able to progress her exercises today and only needed minimal cues for shoulder elevation today.  She will benefit from skilled PT to continue to pogress strength and ROM to achieve functional goals.    PT Treatment/Interventions  ADLs/Self Care Home Management;Cryotherapy;Electrical Stimulation;Moist Heat;Manual techniques;Dry needling;Taping;Passive range of motion;Patient/family education;Therapeutic exercise;Therapeutic activities;Neuromuscular  re-education    PT Next Visit Plan  Rt UE strength, posture, elbow ROM    PT Home Exercise Plan   Access Code: [email or web address]     Consulted and Agree with Plan of Care  Patient       Patient will benefit from skilled therapeutic intervention in order to improve the following deficits and impairments:  Decreased range of motion, Decreased strength, Increased fascial restricitons, Pain, Impaired UE functional use, Postural dysfunction  Visit Diagnosis: Stiffness of right elbow, not elsewhere classified  Muscle weakness (generalized)  Pain in right elbow     Problem List Patient Active Problem List   Diagnosis Date Noted  . Fracture of radial head, right, closed 04/08/2019  . GERD (gastroesophageal reflux disease) 01/26/2019  . Elbow contusion 01/26/2019  . [place] arthritis 10/21/2018  . Vitamin D deficiency 09/22/2018  . Cough 02/04/2017  . Rash and nonspecific skin eruption 07/23/2016  . Well adult exam 06/25/2016  . Situational anxiety 04/02/2016  . Viral URI with cough 01/23/2016  . Patellofemoral syndrome of both knees 01/03/2016  . Weight gain 12/28/2015  . Weakness generalized 05/24/2015  . Nonallopathic lesion of lumbosacral region 05/16/2015  . Nonallopathic lesion of thoracic region 05/16/2015  . SI (sacroiliac) joint dysfunction 03/28/2015  . Nonallopathic lesion of sacral region 03/28/2015  . Impingement syndrome of left shoulder 03/28/2015  . Leg cramping 09/29/2014  . Acute meniscal tear of right knee 07/28/2014  . Insomnia 03/13/2013  . Blurred vision 03/13/2013  . NECK PAIN 07/27/2009  . CHEST WALL PAIN 07/27/2009  . FIBROIDS, UTERUS 11/18/2007  . Essential hypertension 11/18/2007    Jule Ser, PT 05/08/2019, 4:29 PM  [facility] [address] [place], [place], [address] Phone: [phone]   Fax:  [phone]  Name: Sydney [name] MRN: [id number] Date of Birth: 1966-03-04

## 2019-05-13 ENCOUNTER — Encounter: Payer: Federal, State, Local not specified - PPO | Admitting: Physical Therapy

## 2019-05-15 ENCOUNTER — Encounter: Payer: Self-pay | Admitting: Physical Therapy

## 2019-05-15 ENCOUNTER — Ambulatory Visit: Payer: Federal, State, Local not specified - PPO | Admitting: Physical Therapy

## 2019-05-15 ENCOUNTER — Other Ambulatory Visit: Payer: Self-pay

## 2019-05-15 DIAGNOSIS — M6281 Muscle weakness (generalized): Secondary | ICD-10-CM

## 2019-05-15 DIAGNOSIS — M25521 Pain in right elbow: Secondary | ICD-10-CM | POA: Diagnosis not present

## 2019-05-15 DIAGNOSIS — M25621 Stiffness of right elbow, not elsewhere classified: Secondary | ICD-10-CM | POA: Diagnosis not present

## 2019-05-15 NOTE — Therapy (Signed)
[facility] [address], [place], [place], [address] Phone: [phone]   Fax:  [phone]  Physical Therapy Treatment  Patient Details  Name: Sydney [name] MRN: [id number] Date of Birth: 10-Sep-1966 Referring Provider (PT): Lyndal Pulley, D   Encounter Date: 05/15/2019  PT End of Session - 05/15/19 1541    Visit Number  4    Date for PT Re-Evaluation  06/17/19    PT Start Time  3810    PT Stop Time  1620    PT Time Calculation (min)  47 min    Activity Tolerance  Patient tolerated treatment well    Behavior During Therapy  [facility] for tasks assessed/performed       Past Medical History:  Diagnosis Date  . Hypertension     History reviewed. No pertinent surgical history.  There were no vitals filed for this visit.  Subjective Assessment - 05/15/19 1558    Subjective  Pt states she has no pain just feeling popping and clicking                       [facility] Adult PT Treatment/Exercise - 05/15/19 0001      Elbow Exercises   Elbow Extension  Strengthening;Power Tower;Both;20 reps   one plate   Forearm Supination  Strengthening;Right;20 reps;Standing;Bar weights/barbell    Bar Weights/Barbell (Forearm Supination)  1 lb    Forearm Pronation  Strengthening;Right;20 reps;Standing;Bar weights/barbell    Bar Weights/Barbell (Forearm Pronation)  1 lb      Shoulder Exercises: Supine   Horizontal ABduction  Strengthening;Both;20 reps;Theraband    Theraband Level (Shoulder Horizontal ABduction)  Level 1 (Yellow)    External Rotation  Strengthening;Both;20 reps;Theraband    Diagonals  Strengthening;Both;20 reps;Theraband    Theraband Level (Shoulder Diagonals)  Level 1 (Yellow)      Manual Therapy   Soft tissue mobilization  Rt forearm and bicep - trigger point release    Passive ROM  elbow extension             PT Education - 05/15/19 1626    Education Details  Access Code: [email or web address]    Person(s) Educated  Patient    Methods  Handout;Verbal cues;Tactile cues;Demonstration;Explanation    Comprehension  Verbalized understanding;Returned demonstration       PT Short Term Goals - 05/06/19 1326      PT SHORT TERM GOAL #2   Title  Pt will have only minimal tenderness of radial head and shaft on right side with palpation due to less trigger points in forearm    Status  Achieved        PT Long Term Goals - 04/22/19 1349      PT LONG TERM GOAL #1   Title  Pt will be able to straighten Rt elbow to 0 deg neutral for improved functional reaching overhead    Baseline  -35 deg AROM; -20 deg PROM    Time  8    Period  Weeks    Status  New    Target Date  06/17/19      PT LONG TERM GOAL #2   Title  Pt will be able to perform exercises with free weights such as tricep extension without increased pain    Baseline  5/10 pain and cannot perform    Time  8    Period  Weeks    Status  New    Target Date  06/17/19  PT LONG TERM GOAL #3   Title  Pt will be ind with advanced HEP    Time  8    Period  Weeks    Status  New    Target Date  06/17/19      PT LONG TERM GOAL #4   Title  Pt will report pain 2/10 at most during all typical activities.    Baseline  up to 5/10    Time  8    Period  Weeks    Status  New    Target Date  06/17/19            Plan - 05/15/19 1627    Clinical Impression Statement  Pt was able to do supination and pronation with small 2lb weight.  No increased pain throughout session.  She is still lacking some elbow extension and has tension in forearm flexors, extensors, supinator and pronator.  Pt will benefit from skilled PT to promoate healing and safe exercise progression.    PT Treatment/Interventions  ADLs/Self Care Home Management;Cryotherapy;Electrical Stimulation;Moist Heat;Manual techniques;Dry needling;Taping;Passive range of motion;Patient/family education;Therapeutic exercise;Therapeutic activities;Neuromuscular re-education    PT  Next Visit Plan  Rt UE strength, posture, elbow ROM    PT Home Exercise Plan   Access Code: [email or web address]     Consulted and Agree with Plan of Care  Patient       Patient will benefit from skilled therapeutic intervention in order to improve the following deficits and impairments:  Decreased range of motion, Decreased strength, Increased fascial restricitons, Pain, Impaired UE functional use, Postural dysfunction  Visit Diagnosis: Pain in right elbow  Stiffness of right elbow, not elsewhere classified  Muscle weakness (generalized)     Problem List Patient Active Problem List   Diagnosis Date Noted  . Fracture of radial head, right, closed 04/08/2019  . GERD (gastroesophageal reflux disease) 01/26/2019  . Elbow contusion 01/26/2019  . [place] arthritis 10/21/2018  . Vitamin D deficiency 09/22/2018  . Cough 02/04/2017  . Rash and nonspecific skin eruption 07/23/2016  . Well adult exam 06/25/2016  . Situational anxiety 04/02/2016  . Viral URI with cough 01/23/2016  . Patellofemoral syndrome of both knees 01/03/2016  . Weight gain 12/28/2015  . Weakness generalized 05/24/2015  . Nonallopathic lesion of lumbosacral region 05/16/2015  . Nonallopathic lesion of thoracic region 05/16/2015  . SI (sacroiliac) joint dysfunction 03/28/2015  . Nonallopathic lesion of sacral region 03/28/2015  . Impingement syndrome of left shoulder 03/28/2015  . Leg cramping 09/29/2014  . Acute meniscal tear of right knee 07/28/2014  . Insomnia 03/13/2013  . Blurred vision 03/13/2013  . NECK PAIN 07/27/2009  . CHEST WALL PAIN 07/27/2009  . FIBROIDS, UTERUS 11/18/2007  . Essential hypertension 11/18/2007    Jule Ser, PT 05/15/2019, 4:30 PM  [facility] [address], [place], [place], [address] Phone: [phone]   Fax:  [phone]  Name: Sydney [name] MRN: [id number] Date of Birth: [birth year]

## 2019-05-15 NOTE — Patient Instructions (Signed)
Access Code: [email or web address]  URL: [email or web address]  Date: 05/15/2019  Prepared by: Jari Favre   Exercises  Standing Elbow Extension with Towel Roll at [place] - 10 reps - 1 sets - 5 sec hold - 1x daily - 7x weekly  Standing Elbow Extension with Towel Roll at [place] - 10 reps - 1 sets - 1x daily - 7x weekly  Standing Overhead Elbow Extension - 10 reps - 1 sets - 5 sec hold - 1x daily - 7x weekly  Shoulder Extension with Resistance - 10 reps - 3 sets - 1x daily - 7x weekly  Standing Bilateral Low Shoulder Row with Anchored Resistance - 10 reps - 3 sets - 1x daily - 7x weekly  Wall Push Up - 10 reps - 3 sets - 1x daily - 7x weekly  Forearm PROM Supination and Pronation with Low Grip on Hammer - 10 reps - 3 sets - 1x daily - 7x weekly

## 2019-05-20 ENCOUNTER — Other Ambulatory Visit: Payer: Self-pay

## 2019-05-20 ENCOUNTER — Ambulatory Visit: Payer: Federal, State, Local not specified - PPO | Admitting: Physical Therapy

## 2019-05-20 ENCOUNTER — Encounter: Payer: Self-pay | Admitting: Physical Therapy

## 2019-05-20 DIAGNOSIS — M25621 Stiffness of right elbow, not elsewhere classified: Secondary | ICD-10-CM

## 2019-05-20 DIAGNOSIS — M6281 Muscle weakness (generalized): Secondary | ICD-10-CM | POA: Diagnosis not present

## 2019-05-20 DIAGNOSIS — M25521 Pain in right elbow: Secondary | ICD-10-CM

## 2019-05-20 NOTE — Patient Instructions (Signed)
Access Code: [email or web address]  URL: [email or web address]  Date: 05/20/2019  Prepared by: Jari Favre   Exercises  Standing Elbow Extension with Towel Roll at [place] - 10 reps - 1 sets - 5 sec hold - 1x daily - 7x weekly  Standing Elbow Extension with Towel Roll at [place] - 10 reps - 1 sets - 1x daily - 7x weekly  Standing Overhead Elbow Extension - 10 reps - 1 sets - 5 sec hold - 1x daily - 7x weekly  Shoulder Extension with Resistance - 10 reps - 3 sets - 1x daily - 7x weekly  Standing Bilateral Low Shoulder Row with Anchored Resistance - 10 reps - 3 sets - 1x daily - 7x weekly  Wall Push Up - 10 reps - 3 sets - 1x daily - 7x weekly  Forearm PROM Supination and Pronation with Low Grip on Hammer - 10 reps - 3 sets - 1x daily - 7x weekly  Bent Over Tricep Extension with Counter Support - 10 reps - 3 sets - 1x daily - 7x weekly

## 2019-05-20 NOTE — Therapy (Signed)
[facility] [address] [place], [place], [address] Phone: [phone]   Fax:  [phone]  Physical Therapy Treatment  Patient Details  Name: Sydney [name] MRN: [id number] Date of Birth: 12/02/1966 Referring Provider (PT): Lyndal Pulley, D   Encounter Date: 05/20/2019  PT End of Session - 05/20/19 1333    Visit Number  5    Date for PT Re-Evaluation  06/17/19    PT Start Time  6387    PT Stop Time  1413    PT Time Calculation (min)  40 min    Activity Tolerance  Patient tolerated treatment well    Behavior During Therapy  [facility] for tasks assessed/performed       Past Medical History:  Diagnosis Date  . Hypertension     History reviewed. No pertinent surgical history.  There were no vitals filed for this visit.  Subjective Assessment - 05/20/19 1418    Subjective  Have only had a little pain with reaching behind my back    Currently in Pain?  No/denies         [facility] PT Assessment - 05/20/19 0001      AROM   Overall AROM Comments  -18   in standing                  [facility] Adult PT Treatment/Exercise - 05/20/19 0001      Elbow Exercises   Elbow Extension  Strengthening;Power Tower;Both;20 reps    Bar Weights/Barbell (Elbow Extension)  3 lbs    Forearm Supination  Strengthening;Right;20 reps;Standing;Bar weights/barbell    Bar Weights/Barbell (Forearm Supination)  3 lbs    Forearm Pronation  Strengthening;Right;20 reps;Standing;Bar weights/barbell    Bar Weights/Barbell (Forearm Pronation)  3 lbs      Shoulder Exercises: Supine   Horizontal ABduction  Strengthening;Both;20 reps;Theraband    Theraband Level (Shoulder Horizontal ABduction)  Level 2 (Red)      Shoulder Exercises: Standing   Extension  Strengthening;Both;20 reps;Weights    Theraband Level (Shoulder Extension)  Level 2 (Red)    Row  Strengthening;Both;20 reps;Weights    Theraband Level (Shoulder Row)  Level 2 (Red)     Other Standing Exercises  elbow ext at power tower - 20lb - 2x 20 reps      Shoulder Exercises: ROM/Strengthening   UBE (Upper Arm Bike)  L1 x 8 min (6 fwd/ 2 back)   PT there for status update            PT Education - 05/20/19 1413    Education Details  Access Code: [email or web address]    Person(s) Educated  Patient    Methods  Explanation;Demonstration;Handout;Verbal cues    Comprehension  Verbalized understanding;Returned demonstration       PT Short Term Goals - 05/06/19 1326      PT SHORT TERM GOAL #2   Title  Pt will have only minimal tenderness of radial head and shaft on right side with palpation due to less trigger points in forearm    Status  Achieved        PT Long Term Goals - 05/20/19 1417      PT LONG TERM GOAL #1   Title  Pt will be able to straighten Rt elbow to 0 deg neutral for improved functional reaching overhead    Baseline  -18 AROM    Status  On-going      PT LONG TERM GOAL #2   Title  Pt will  be able to perform exercises with free weights such as tricep extension without increased pain    Baseline  pain free with 3lb weight    Status  Achieved      PT LONG TERM GOAL #3   Title  Pt will be ind with advanced HEP    Status  On-going      PT LONG TERM GOAL #4   Title  Pt will report pain 2/10 at most during all typical activities.    Baseline  pain only with reaching behind back with Rt UE    Status  On-going            Plan - 05/20/19 1415    Clinical Impression Statement  pt was able to increase reps and resistance today.  She is not having any pain with exercises and increased weight.  Pt still lacks about 18 deg of elbow extension.  Pt will benefit from skilled PT to improve strength and ROM to promote safe healing of radius fracture.    PT Treatment/Interventions  ADLs/Self Care Home Management;Cryotherapy;Electrical Stimulation;Moist Heat;Manual techniques;Dry needling;Taping;Passive range of motion;Patient/family education;Therapeutic  exercise;Therapeutic activities;Neuromuscular re-education    PT Next Visit Plan  Rt UE strength, posture, elbow ROM flexion ext    PT Home Exercise Plan   Access Code: [email or web address]     Consulted and Agree with Plan of Care  Patient       Patient will benefit from skilled therapeutic intervention in order to improve the following deficits and impairments:  Decreased range of motion, Decreased strength, Increased fascial restricitons, Pain, Impaired UE functional use, Postural dysfunction  Visit Diagnosis: 1. Pain in right elbow   2. Stiffness of right elbow, not elsewhere classified   3. Muscle weakness (generalized)        Problem List Patient Active Problem List   Diagnosis Date Noted  . Fracture of radial head, right, closed 04/08/2019  . GERD (gastroesophageal reflux disease) 01/26/2019  . Elbow contusion 01/26/2019  . [place] arthritis 10/21/2018  . Vitamin D deficiency 09/22/2018  . Cough 02/04/2017  . Rash and nonspecific skin eruption 07/23/2016  . Well adult exam 06/25/2016  . Situational anxiety 04/02/2016  . Viral URI with cough 01/23/2016  . Patellofemoral syndrome of both knees 01/03/2016  . Weight gain 12/28/2015  . Weakness generalized 05/24/2015  . Nonallopathic lesion of lumbosacral region 05/16/2015  . Nonallopathic lesion of thoracic region 05/16/2015  . SI (sacroiliac) joint dysfunction 03/28/2015  . Nonallopathic lesion of sacral region 03/28/2015  . Impingement syndrome of left shoulder 03/28/2015  . Leg cramping 09/29/2014  . Acute meniscal tear of right knee 07/28/2014  . Insomnia 03/13/2013  . Blurred vision 03/13/2013  . NECK PAIN 07/27/2009  . CHEST WALL PAIN 07/27/2009  . FIBROIDS, UTERUS 11/18/2007  . Essential hypertension 11/18/2007    Jule Ser, PT 05/20/2019, 2:21 PM  [facility] [address] [place], [place], [address] Phone: [phone]   Fax:  [phone]  Name:  Sydney [name] MRN: [id number] Date of Birth: February 16, 1966

## 2019-05-22 ENCOUNTER — Encounter: Payer: Self-pay | Admitting: Physical Therapy

## 2019-05-22 ENCOUNTER — Telehealth: Payer: Self-pay | Admitting: Family Medicine

## 2019-05-22 ENCOUNTER — Ambulatory Visit: Payer: Federal, State, Local not specified - PPO | Admitting: Physical Therapy

## 2019-05-22 ENCOUNTER — Other Ambulatory Visit: Payer: Self-pay

## 2019-05-22 DIAGNOSIS — M6281 Muscle weakness (generalized): Secondary | ICD-10-CM

## 2019-05-22 DIAGNOSIS — M25521 Pain in right elbow: Secondary | ICD-10-CM | POA: Diagnosis not present

## 2019-05-22 DIAGNOSIS — M25621 Stiffness of right elbow, not elsewhere classified: Secondary | ICD-10-CM

## 2019-05-22 NOTE — Therapy (Signed)
[facility] [address] [place], [place], [address] Phone: [phone]   Fax:  [phone]  Physical Therapy Treatment  Patient Details  Name: Sydney [name] MRN: [id number] Date of Birth: 02-21-1966 Referring Provider (PT): Lyndal Pulley, D   Encounter Date: 05/22/2019  PT End of Session - 05/22/19 1528    Visit Number  6    Date for PT Re-Evaluation  06/17/19    PT Start Time  1528    PT Stop Time  1612    PT Time Calculation (min)  44 min    Activity Tolerance  Patient tolerated treatment well    Behavior During Therapy  [facility] for tasks assessed/performed       Past Medical History:  Diagnosis Date  . Hypertension     History reviewed. No pertinent surgical history.  There were no vitals filed for this visit.  Subjective Assessment - 05/22/19 1529    Subjective  Still having a little pain with certain moves.    Patient Stated Goals  be able to return to exercise and get full ROM     Currently in Pain?  No/denies         [facility] PT Assessment - 05/22/19 0001      AROM   Overall AROM Comments  -12                   [facility] Adult PT Treatment/Exercise - 05/22/19 0001      Elbow Exercises   Forearm Supination  Strengthening;Right;20 reps;Standing;Theraband    Theraband Level (Supination)  Level 2 (Red)    Forearm Pronation  Strengthening;Right;20 reps;Standing;Theraband    Theraband Level (Pronation)  Level 2 (Red)      Shoulder Exercises: Standing   Row  Strengthening;Both;20 reps;Weights    Row Weight (lbs)  25 lb - power tower    Other Standing Exercises  shoulder 3 ways standing (flex, scaption, abduction) to 90 deg - 3lb x 10 each way    Other Standing Exercises  elbow ext - 25lb - 20x      Shoulder Exercises: ROM/Strengthening   UBE (Upper Arm Bike)  L2 x 8 min (4 fwd/ 2 back)   PT there for status update     Manual Therapy   Soft tissue mobilization  Rt forearm and bicep  - trigger point release and STM    Passive ROM  elbow extension and flexion - Rt UE               PT Short Term Goals - 05/06/19 1326      PT SHORT TERM GOAL #2   Title  Pt will have only minimal tenderness of radial head and shaft on right side with palpation due to less trigger points in forearm    Status  Achieved        PT Long Term Goals - 05/20/19 1417      PT LONG TERM GOAL #1   Title  Pt will be able to straighten Rt elbow to 0 deg neutral for improved functional reaching overhead    Baseline  -18 AROM    Status  On-going      PT LONG TERM GOAL #2   Title  Pt will be able to perform exercises with free weights such as tricep extension without increased pain    Baseline  pain free with 3lb weight    Status  Achieved      PT LONG TERM  GOAL #3   Title  Pt will be ind with advanced HEP    Status  On-going      PT LONG TERM GOAL #4   Title  Pt will report pain 2/10 at most during all typical activities.    Baseline  pain only with reaching behind back with Rt UE    Status  On-going            Plan - 05/22/19 1612    Clinical Impression Statement  Pt had a little more elbow extension today and only lacking 12 degrees.  Pt is still progressing with increased resistance and reps.  Pt will continue to benefit from skilled PT for increased strength and ROM to return to maximum function.    PT Treatment/Interventions  ADLs/Self Care Home Management;Cryotherapy;Electrical Stimulation;Moist Heat;Manual techniques;Dry needling;Taping;Passive range of motion;Patient/family education;Therapeutic exercise;Therapeutic activities;Neuromuscular re-education    PT Next Visit Plan  Rt UE strength, posture, elbow ROM flexion and extension    PT Home Exercise Plan   Access Code: [email or web address]     Consulted and Agree with Plan of Care  Patient       Patient will benefit from skilled therapeutic intervention in order to improve the following deficits and impairments:  Decreased  range of motion, Decreased strength, Increased fascial restricitons, Pain, Impaired UE functional use, Postural dysfunction  Visit Diagnosis: 1. Pain in right elbow   2. Stiffness of right elbow, not elsewhere classified   3. Muscle weakness (generalized)        Problem List Patient Active Problem List   Diagnosis Date Noted  . Fracture of radial head, right, closed 04/08/2019  . GERD (gastroesophageal reflux disease) 01/26/2019  . Elbow contusion 01/26/2019  . [place] arthritis 10/21/2018  . Vitamin D deficiency 09/22/2018  . Cough 02/04/2017  . Rash and nonspecific skin eruption 07/23/2016  . Well adult exam 06/25/2016  . Situational anxiety 04/02/2016  . Viral URI with cough 01/23/2016  . Patellofemoral syndrome of both knees 01/03/2016  . Weight gain 12/28/2015  . Weakness generalized 05/24/2015  . Nonallopathic lesion of lumbosacral region 05/16/2015  . Nonallopathic lesion of thoracic region 05/16/2015  . SI (sacroiliac) joint dysfunction 03/28/2015  . Nonallopathic lesion of sacral region 03/28/2015  . Impingement syndrome of left shoulder 03/28/2015  . Leg cramping 09/29/2014  . Acute meniscal tear of right knee 07/28/2014  . Insomnia 03/13/2013  . Blurred vision 03/13/2013  . NECK PAIN 07/27/2009  . CHEST WALL PAIN 07/27/2009  . FIBROIDS, UTERUS 11/18/2007  . Essential hypertension 11/18/2007    Jule Ser, PT 05/22/2019, 4:14 PM  [facility] [address], [place], [place], [address] Phone: [phone]   Fax:  [phone]  Name: Sydney [name] MRN: [id number] Date of Birth: 1966/08/30

## 2019-05-22 NOTE — Telephone Encounter (Signed)
Copied from [place] [id number]. Topic: Appointment Scheduling - Scheduling Inquiry for Clinic >> May 22, 2019 11:38 AM Sydney [name] wrote: Reason for CRM:   Pt calling to find out if Dr. Tamala Julian still wants to see her on 06/24 because she will be having PT done at 1:30 and isn't sure if it's okay to come directly after PT for an evaluation of the pain.

## 2019-05-23 NOTE — Telephone Encounter (Signed)
Up to her. I am fine with it.

## 2019-05-25 NOTE — Telephone Encounter (Signed)
Spoke to pt, she rescheduled appt to 06/24/2019.

## 2019-05-27 ENCOUNTER — Ambulatory Visit: Payer: Federal, State, Local not specified - PPO | Admitting: Physical Therapy

## 2019-05-27 ENCOUNTER — Other Ambulatory Visit: Payer: Self-pay

## 2019-05-27 ENCOUNTER — Encounter: Payer: Self-pay | Admitting: Physical Therapy

## 2019-05-27 ENCOUNTER — Ambulatory Visit: Payer: Federal, State, Local not specified - PPO | Admitting: Family Medicine

## 2019-05-27 DIAGNOSIS — M25621 Stiffness of right elbow, not elsewhere classified: Secondary | ICD-10-CM

## 2019-05-27 DIAGNOSIS — M25521 Pain in right elbow: Secondary | ICD-10-CM | POA: Diagnosis not present

## 2019-05-27 DIAGNOSIS — M6281 Muscle weakness (generalized): Secondary | ICD-10-CM

## 2019-05-27 NOTE — Therapy (Signed)
[facility] [address], [place], [place], [address] Phone: [phone]   Fax:  [phone]  Physical Therapy Treatment  Patient Details  Name: Sydney [name] MRN: [id number] Date of Birth: 03-06-1966 Referring Provider (PT): Lyndal Pulley, D   Encounter Date: 05/27/2019  PT End of Session - 05/27/19 1337    Visit Number  7    Date for PT Re-Evaluation  06/17/19    PT Start Time  1330    PT Stop Time  1410    PT Time Calculation (min)  40 min    Activity Tolerance  Patient tolerated treatment well    Behavior During Therapy  [facility] for tasks assessed/performed       Past Medical History:  Diagnosis Date  . Hypertension     History reviewed. No pertinent surgical history.  There were no vitals filed for this visit.  Subjective Assessment - 05/27/19 1419    Subjective  I feel like the pain is improving when reaching behind my back with my elbow bent    Patient Stated Goals  be able to return to exercise and get full ROM     Currently in Pain?  No/denies         [facility] PT Assessment - 05/27/19 0001      AROM   Overall AROM Comments  -12                   [facility] Adult PT Treatment/Exercise - 05/27/19 0001      Elbow Exercises   Elbow Extension  Strengthening;Both;20 reps;Right;Standing    Bar Weights/Barbell (Elbow Extension)  3 lbs    Other elbow exercises  supination/pronation with rolling wand on velcro      Shoulder Exercises: Standing   Horizontal ABduction  Strengthening;Right;20 reps;Theraband    Theraband Level (Shoulder Horizontal ABduction)  Level 2 (Red)      Manual Therapy   Soft tissue mobilization  Rt forearm and bicep - trigger point release and STM    Passive ROM  elbow extension and flexion, supination - Rt UE               PT Short Term Goals - 05/06/19 1326      PT SHORT TERM GOAL #2   Title  Pt will have only minimal tenderness of radial head and shaft  on right side with palpation due to less trigger points in forearm    Status  Achieved        PT Long Term Goals - 05/27/19 1421      PT LONG TERM GOAL #1   Title  Pt will be able to straighten Rt elbow to 0 deg neutral for improved functional reaching overhead    Baseline  -12 degrees            Plan - 05/27/19 1341    Clinical Impression Statement  Pt is doing well with progression of exercises.  She has no increased pain with grade III mobs on radius for increased supination and extension.  She has 12 degrees away from full extension today.  Pt will continue to benefit from skilled PT to continue to progress for greater functional reaching.    PT Treatment/Interventions  ADLs/Self Care Home Management;Cryotherapy;Electrical Stimulation;Moist Heat;Manual techniques;Dry needling;Taping;Passive range of motion;Patient/family education;Therapeutic exercise;Therapeutic activities;Neuromuscular re-education    PT Next Visit Plan  Rt UE strength, posture, elbow ROM flexion and extension    PT Home Exercise Plan  Access Code: [email or web address]     Consulted and Agree with Plan of Care  Patient       Patient will benefit from skilled therapeutic intervention in order to improve the following deficits and impairments:  Decreased range of motion, Decreased strength, Increased fascial restricitons, Pain, Impaired UE functional use, Postural dysfunction  Visit Diagnosis: 1. Stiffness of right elbow, not elsewhere classified   2. Muscle weakness (generalized)   3. Pain in right elbow        Problem List Patient Active Problem List   Diagnosis Date Noted  . Fracture of radial head, right, closed 04/08/2019  . GERD (gastroesophageal reflux disease) 01/26/2019  . Elbow contusion 01/26/2019  . [place] arthritis 10/21/2018  . Vitamin D deficiency 09/22/2018  . Cough 02/04/2017  . Rash and nonspecific skin eruption 07/23/2016  . Well adult exam 06/25/2016  . Situational anxiety 04/02/2016  .  Viral URI with cough 01/23/2016  . Patellofemoral syndrome of both knees 01/03/2016  . Weight gain 12/28/2015  . Weakness generalized 05/24/2015  . Nonallopathic lesion of lumbosacral region 05/16/2015  . Nonallopathic lesion of thoracic region 05/16/2015  . SI (sacroiliac) joint dysfunction 03/28/2015  . Nonallopathic lesion of sacral region 03/28/2015  . Impingement syndrome of left shoulder 03/28/2015  . Leg cramping 09/29/2014  . Acute meniscal tear of right knee 07/28/2014  . Insomnia 03/13/2013  . Blurred vision 03/13/2013  . NECK PAIN 07/27/2009  . CHEST WALL PAIN 07/27/2009  . FIBROIDS, UTERUS 11/18/2007  . Essential hypertension 11/18/2007    Jule Ser, PT 05/27/2019, 2:22 PM  [facility] [address], [place], [place], [address] Phone: [phone]   Fax:  [phone]  Name: Sydney [name] MRN: [id number] Date of Birth: Jul 01, 1966

## 2019-05-29 ENCOUNTER — Other Ambulatory Visit: Payer: Self-pay

## 2019-05-29 ENCOUNTER — Ambulatory Visit: Payer: Federal, State, Local not specified - PPO | Admitting: Physical Therapy

## 2019-05-29 ENCOUNTER — Encounter: Payer: Self-pay | Admitting: Physical Therapy

## 2019-05-29 DIAGNOSIS — M25521 Pain in right elbow: Secondary | ICD-10-CM | POA: Diagnosis not present

## 2019-05-29 DIAGNOSIS — M25621 Stiffness of right elbow, not elsewhere classified: Secondary | ICD-10-CM

## 2019-05-29 DIAGNOSIS — M6281 Muscle weakness (generalized): Secondary | ICD-10-CM | POA: Diagnosis not present

## 2019-05-29 NOTE — Therapy (Signed)
[facility] [address], [place], [place], [address] Phone: [phone]   Fax:  [phone]  Physical Therapy Treatment  Patient Details  Name: Sydney [name] MRN: [id number] Date of Birth: 1966/04/29 Referring Provider (PT): Lyndal Pulley, D   Encounter Date: 05/29/2019  PT End of Session - 05/29/19 1534    Visit Number  8    Date for PT Re-Evaluation  06/17/19    PT Start Time  1532    PT Stop Time  1615    PT Time Calculation (min)  43 min    Activity Tolerance  Patient tolerated treatment well    Behavior During Therapy  [facility] for tasks assessed/performed       Past Medical History:  Diagnosis Date  . Hypertension     History reviewed. No pertinent surgical history.  There were no vitals filed for this visit.  Subjective Assessment - 05/29/19 1536    Subjective  Pt states the elbow feels sore but only when touching it and points to triceps attachment.    Currently in Pain?  No/denies                       [facility] Adult PT Treatment/Exercise - 05/29/19 0001      Elbow Exercises   Elbow Extension  Strengthening;Both;20 reps;Right;Standing    Elbow Extension Limitations  power tower attempt 25lb x 5 reps; then switch to 15lb x 15    Other elbow exercises  elbow and wrist ext stretch on wall      Shoulder Exercises: Prone   Retraction  Strengthening;Right;20 reps;Weights    Retraction Weight (lbs)  4    Extension  Strengthening;Right;20 reps;Weights    Extension Weight (lbs)  3    Horizontal ABduction 1  Strengthening;Right;20 reps;Weights    Horizontal ABduction 1 Weight (lbs)  3      Shoulder Exercises: Standing   Row  Strengthening;Both;20 reps;Weights    Row Weight (lbs)  25 lb - power tower    Row Limitations  low rows      Shoulder Exercises: ROM/Strengthening   UBE (Upper Arm Bike)  L3 x 8 min (4 fwd/ 4 back)   PT there for status update     Manual Therapy   Manual  therapy comments  prone    Soft tissue mobilization  Rt tricep, forearm and bicep - trigger point release and STM    Passive ROM  elbow extension and flexion, supination - Rt UE               PT Short Term Goals - 05/06/19 1326      PT SHORT TERM GOAL #2   Title  Pt will have only minimal tenderness of radial head and shaft on right side with palpation due to less trigger points in forearm    Status  Achieved        PT Long Term Goals - 05/27/19 1421      PT LONG TERM GOAL #1   Title  Pt will be able to straighten Rt elbow to 0 deg neutral for improved functional reaching overhead    Baseline  -12 degrees            Plan - 05/29/19 1616    Clinical Impression Statement  Pt continues to have -12 deg of elbow extension.  She had some trigger points in tricep muscles.  She was instructed on adding wrist and elbow extension  stretch on the wall or on table and she felt a good stretch.  She is able to tolerate more resistance with exercises.  She continues to benefit from skilled PT to facilitate healing  and impooved ROM.    PT Treatment/Interventions  ADLs/Self Care Home Management;Cryotherapy;Electrical Stimulation;Moist Heat;Manual techniques;Dry needling;Taping;Passive range of motion;Patient/family education;Therapeutic exercise;Therapeutic activities;Neuromuscular re-education    PT Next Visit Plan  Rt UE strength, posture, elbow ROM flexion and extension    PT Home Exercise Plan   Access Code: [email or web address]     Consulted and Agree with Plan of Care  Patient       Patient will benefit from skilled therapeutic intervention in order to improve the following deficits and impairments:  Decreased range of motion, Decreased strength, Increased fascial restricitons, Pain, Impaired UE functional use, Postural dysfunction  Visit Diagnosis: 1. Stiffness of right elbow, not elsewhere classified   2. Muscle weakness (generalized)   3. Pain in right elbow        Problem  List Patient Active Problem List   Diagnosis Date Noted  . Fracture of radial head, right, closed 04/08/2019  . GERD (gastroesophageal reflux disease) 01/26/2019  . Elbow contusion 01/26/2019  . [place] arthritis 10/21/2018  . Vitamin D deficiency 09/22/2018  . Cough 02/04/2017  . Rash and nonspecific skin eruption 07/23/2016  . Well adult exam 06/25/2016  . Situational anxiety 04/02/2016  . Viral URI with cough 01/23/2016  . Patellofemoral syndrome of both knees 01/03/2016  . Weight gain 12/28/2015  . Weakness generalized 05/24/2015  . Nonallopathic lesion of lumbosacral region 05/16/2015  . Nonallopathic lesion of thoracic region 05/16/2015  . SI (sacroiliac) joint dysfunction 03/28/2015  . Nonallopathic lesion of sacral region 03/28/2015  . Impingement syndrome of left shoulder 03/28/2015  . Leg cramping 09/29/2014  . Acute meniscal tear of right knee 07/28/2014  . Insomnia 03/13/2013  . Blurred vision 03/13/2013  . NECK PAIN 07/27/2009  . CHEST WALL PAIN 07/27/2009  . FIBROIDS, UTERUS 11/18/2007  . Essential hypertension 11/18/2007    Jule Ser, PT 05/29/2019, 4:19 PM  [facility] [address] [place], [place], [address] Phone: [phone]   Fax:  [phone]  Name: Sydney [name] MRN: [id number] Date of Birth: Aug 30, 1966

## 2019-06-01 DIAGNOSIS — Z713 Dietary counseling and surveillance: Secondary | ICD-10-CM | POA: Diagnosis not present

## 2019-06-02 ENCOUNTER — Encounter: Payer: Self-pay | Admitting: Physical Therapy

## 2019-06-02 ENCOUNTER — Other Ambulatory Visit: Payer: Self-pay

## 2019-06-02 ENCOUNTER — Ambulatory Visit: Payer: Federal, State, Local not specified - PPO | Admitting: Physical Therapy

## 2019-06-02 DIAGNOSIS — M25521 Pain in right elbow: Secondary | ICD-10-CM

## 2019-06-02 DIAGNOSIS — M25621 Stiffness of right elbow, not elsewhere classified: Secondary | ICD-10-CM | POA: Diagnosis not present

## 2019-06-02 DIAGNOSIS — M6281 Muscle weakness (generalized): Secondary | ICD-10-CM | POA: Diagnosis not present

## 2019-06-02 NOTE — Therapy (Signed)
[facility] [address], [place], [place], [address] Phone: [phone]   Fax:  [phone]  Physical Therapy Treatment  Patient Details  Name: Sydney [name] MRN: [id number] Date of Birth: 1966-09-20 Referring Provider (PT): Lyndal Pulley, D   Encounter Date: 06/02/2019  PT End of Session - 06/02/19 1330    Visit Number  9    Date for PT Re-Evaluation  06/17/19    PT Start Time  1330    PT Stop Time  1411    PT Time Calculation (min)  41 min    Activity Tolerance  Patient tolerated treatment well    Behavior During Therapy  [facility] for tasks assessed/performed       Past Medical History:  Diagnosis Date  . Hypertension     History reviewed. No pertinent surgical history.  There were no vitals filed for this visit.  Subjective Assessment - 06/02/19 1332    Subjective  Pt is doing well today.    Patient Stated Goals  be able to return to exercise and get full ROM     Currently in Pain?  No/denies                       [facility] Adult PT Treatment/Exercise - 06/02/19 0001      Shoulder Exercises: Standing   Extension  Strengthening;Both;20 reps;Weights   2 sets   Extension Weight (lbs)  20 lb - power tower    [employer]  Strengthening;Both;20 reps;Weights   2 sets   Row Weight (lbs)  25 lb - power tower    Other Standing Exercises  lat pull down - power towrer  - 20 lb 2x20    Other Standing Exercises  elbow extension 4 lb bent over - 2x 20      Shoulder Exercises: ROM/Strengthening   UBE (Upper Arm Bike)  L3 x 8 min (4 fwd/ 4 back)   PT there for status update     Manual Therapy   Manual therapy comments  supine    Soft tissue mobilization  Rt forearm and bicep - trigger point release and STM    Passive ROM  elbow extension and flexion, supination - Rt UE               PT Short Term Goals - 05/06/19 1326      PT SHORT TERM GOAL #2   Title  Pt will have only minimal tenderness  of radial head and shaft on right side with palpation due to less trigger points in forearm    Status  Achieved        PT Long Term Goals - 06/02/19 1414      PT LONG TERM GOAL #1   Title  Pt will be able to straighten Rt elbow to 0 deg neutral for improved functional reaching overhead    Baseline  -12 degrees    Time  8    Period  Weeks    Status  On-going            Plan - 06/02/19 1411    Clinical Impression Statement  No change in elbow extension today.  Pt did well with exercise today and was able to demonstrate push up against the wall with the ball.  She is not getting sore after exercises even with increased difficulty and resistance.  Pt will benefit from skilled PT to progress strength and facilitate healing.    PT  Treatment/Interventions  ADLs/Self Care Home Management;Cryotherapy;Electrical Stimulation;Moist Heat;Manual techniques;Dry needling;Taping;Passive range of motion;Patient/family education;Therapeutic exercise;Therapeutic activities;Neuromuscular re-education    PT Next Visit Plan  Rt UE strength, posture, elbow ROM flexion and extension    PT Home Exercise Plan   Access Code: [email or web address]     Consulted and Agree with Plan of Care  Patient       Patient will benefit from skilled therapeutic intervention in order to improve the following deficits and impairments:  Decreased range of motion, Decreased strength, Increased fascial restricitons, Pain, Impaired UE functional use, Postural dysfunction  Visit Diagnosis: 1. Stiffness of right elbow, not elsewhere classified   2. Muscle weakness (generalized)   3. Pain in right elbow        Problem List Patient Active Problem List   Diagnosis Date Noted  . Fracture of radial head, right, closed 04/08/2019  . GERD (gastroesophageal reflux disease) 01/26/2019  . Elbow contusion 01/26/2019  . [place] arthritis 10/21/2018  . Vitamin D deficiency 09/22/2018  . Cough 02/04/2017  . Rash and nonspecific skin eruption  07/23/2016  . Well adult exam 06/25/2016  . Situational anxiety 04/02/2016  . Viral URI with cough 01/23/2016  . Patellofemoral syndrome of both knees 01/03/2016  . Weight gain 12/28/2015  . Weakness generalized 05/24/2015  . Nonallopathic lesion of lumbosacral region 05/16/2015  . Nonallopathic lesion of thoracic region 05/16/2015  . SI (sacroiliac) joint dysfunction 03/28/2015  . Nonallopathic lesion of sacral region 03/28/2015  . Impingement syndrome of left shoulder 03/28/2015  . Leg cramping 09/29/2014  . Acute meniscal tear of right knee 07/28/2014  . Insomnia 03/13/2013  . Blurred vision 03/13/2013  . NECK PAIN 07/27/2009  . CHEST WALL PAIN 07/27/2009  . FIBROIDS, UTERUS 11/18/2007  . Essential hypertension 11/18/2007    Jule Ser, PT 06/02/2019, 2:22 PM  [facility] [address] [place], [place], [address] Phone: [phone]   Fax:  [phone]  Name: Sydney [name] MRN: [id number] Date of Birth: 1966/03/12

## 2019-06-07 IMAGING — DX RIGHT ELBOW - COMPLETE 3+ VIEW
4 series · 4 of 4 positions shown · non-contrast
Comparison: 03/04/2019, 02/04/2019, 12/25/2018

CLINICAL DATA: Elbow pain, fall [DATE]

EXAM:
RIGHT ELBOW - COMPLETE 3+ VIEW

[elbow ap]
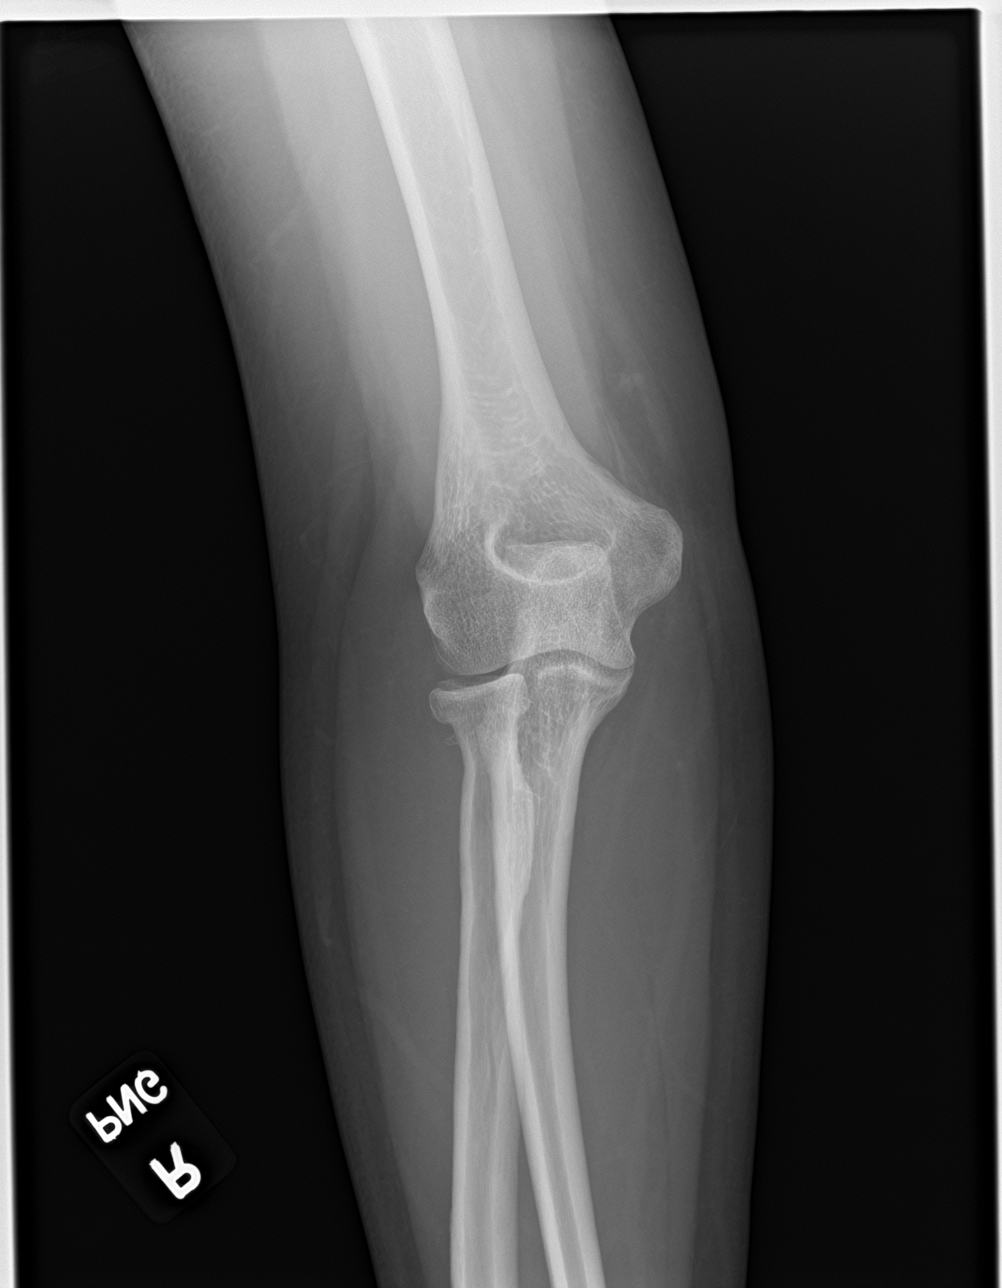

[elbow obl (1 of 2)]
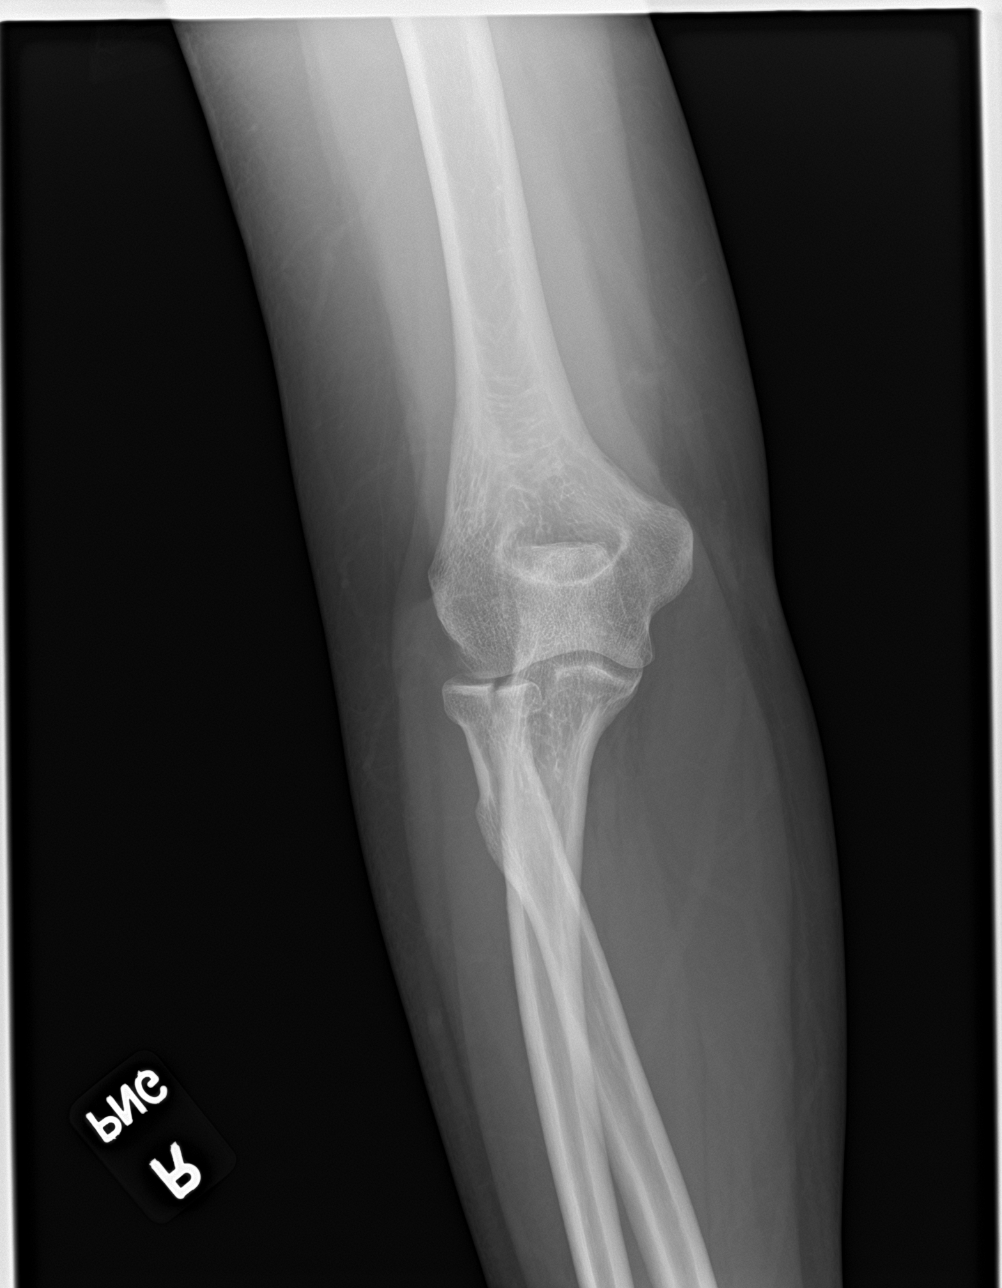

[elbow obl (2 of 2)]
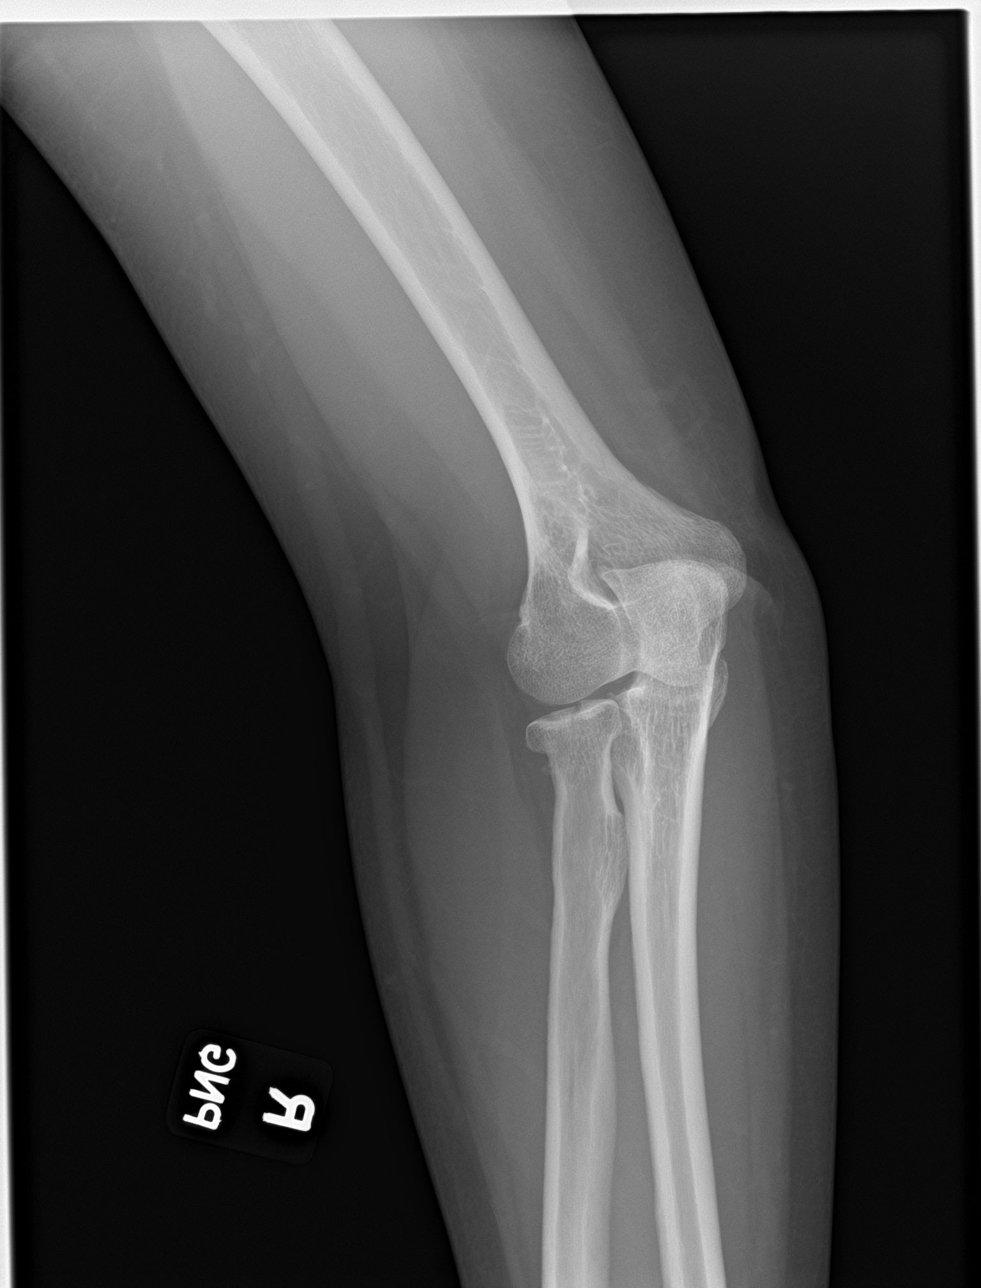

[elbow lat]
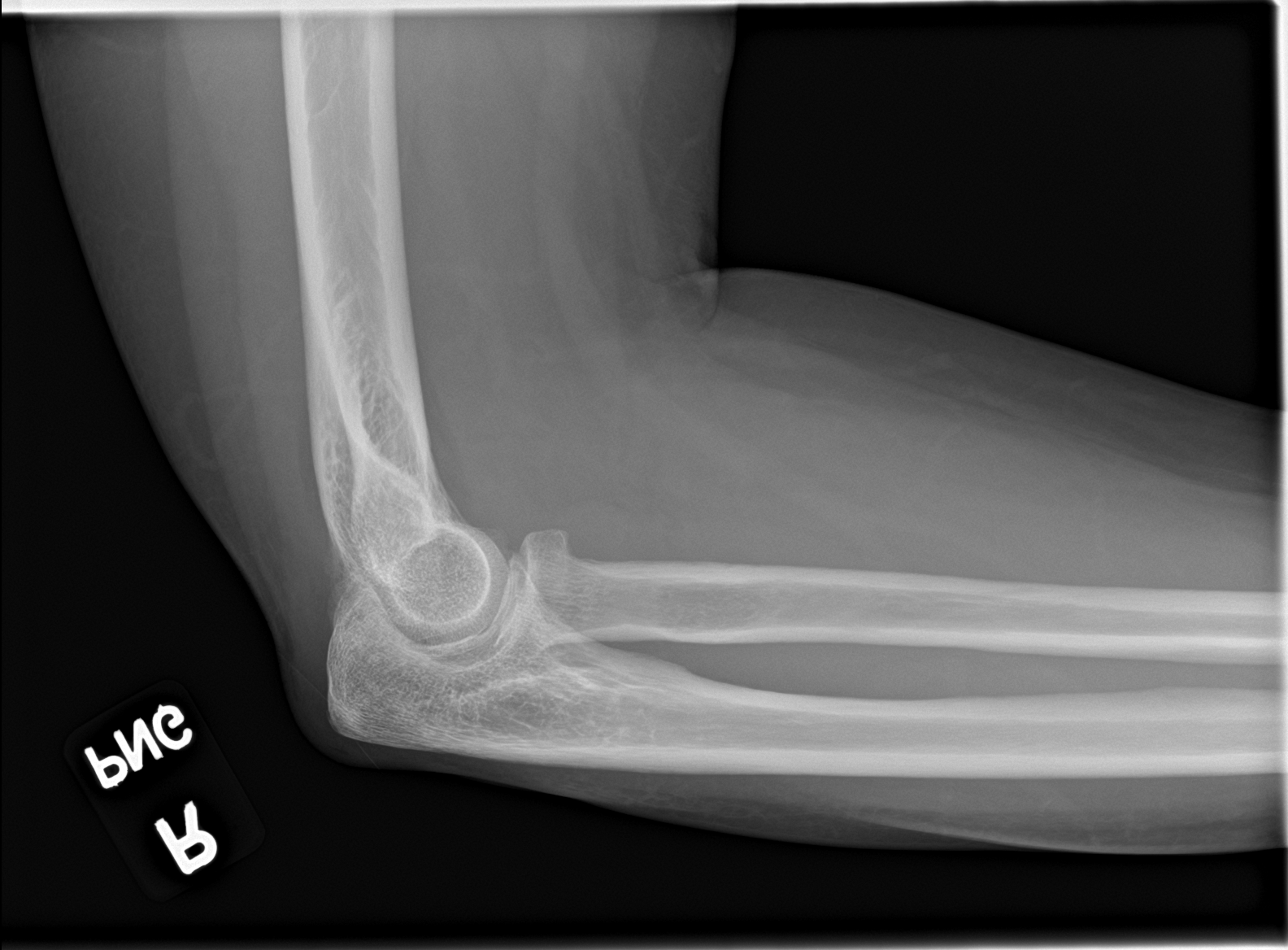

[4 of 4 positions shown; findings below may reference images not displayed]

FINDINGS: Decreased elbow effusion with small residual. No dislocation. No
significant change in alignment of radial head fracture with
incompletely united fracture fragments.
IMPRESSION: Decreased elbow effusion without significant change in alignment of
intra-articular radial head fracture with mild displacement

## 2019-06-09 ENCOUNTER — Other Ambulatory Visit: Payer: Self-pay

## 2019-06-09 ENCOUNTER — Encounter: Payer: Self-pay | Admitting: Physical Therapy

## 2019-06-09 ENCOUNTER — Ambulatory Visit: Payer: Federal, State, Local not specified - PPO | Attending: Family Medicine | Admitting: Physical Therapy

## 2019-06-09 DIAGNOSIS — M25621 Stiffness of right elbow, not elsewhere classified: Secondary | ICD-10-CM | POA: Insufficient documentation

## 2019-06-09 DIAGNOSIS — M25521 Pain in right elbow: Secondary | ICD-10-CM | POA: Insufficient documentation

## 2019-06-09 DIAGNOSIS — M6281 Muscle weakness (generalized): Secondary | ICD-10-CM | POA: Diagnosis not present

## 2019-06-09 NOTE — Therapy (Signed)
[facility] [address] [place], [place], [address] Phone: [phone]   Fax:  [phone]  Physical Therapy Treatment  Patient Details  Name: Sydney [name] MRN: [id number] Date of Birth: 1966/07/22 Referring Provider (PT): Lyndal Pulley, D   Encounter Date: 06/09/2019  PT End of Session - 06/09/19 1336    Visit Number  10    Date for PT Re-Evaluation  06/17/19    PT Start Time  1330    PT Stop Time  1421    PT Time Calculation (min)  51 min    Activity Tolerance  Patient tolerated treatment well    Behavior During Therapy  [facility] for tasks assessed/performed       Past Medical History:  Diagnosis Date  . Hypertension     History reviewed. No pertinent surgical history.  There were no vitals filed for this visit.  Subjective Assessment - 06/09/19 1337    Subjective  Pt states she is using the band and hand weights.  Pt reports she is not having any pain but still feels weakness.    Patient Stated Goals  be able to return to exercise and get full ROM     Currently in Pain?  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, community Limitations  25 lb x 30 reps    Row Limitations  25 lb x 30 reps    Flexion Limitations  fwd punch - 20 x 30 reps    Other Power Tower Exercises  elbow ext - 25lb x 30 reps      Manual Therapy   Manual therapy comments  supine    Soft tissue mobilization  Rt forearm and bicep - trigger point release and STM    Passive ROM  elbow extension and flexion, supination - Rt UE               PT Short Term Goals - 05/06/19 1326      PT SHORT TERM GOAL #2   Title  Pt will have only minimal tenderness of radial head and shaft on right side with palpation due to  less trigger points in forearm    Status  Achieved        PT Long Term Goals - 06/02/19 1414      PT LONG TERM GOAL #1   Title  Pt will be able to straighten Rt elbow to 0 deg neutral for improved functional reaching overhead    Baseline  -12 degrees    Time  8    Period  Weeks    Status  On-going            Plan - 06/09/19 1423    Clinical Impression Statement  Pt had no increased pain. She does get discomfort in the elbow with ROM and joint mobs.  No pain after treatment. Pt is making porgress and able to do tricep push ups from the mat instead of the wall today.  Pt will benefit from skilled PT to continue to progress ROM and strength as tolerated.    PT Treatment/Interventions  ADLs/Self Care Home  Management;Cryotherapy;Electrical Stimulation;Moist Heat;Manual techniques;Dry needling;Taping;Passive range of motion;Patient/family education;Therapeutic exercise;Therapeutic activities;Neuromuscular re-education    PT Next Visit Plan  Rt UE strength, posture, elbow ROM flexion and extension    PT Home Exercise Plan   Access Code: [email or web address]     Consulted and Agree with Plan of Care  Patient       Patient will benefit from skilled therapeutic intervention in order to improve the following deficits and impairments:  Decreased range of motion, Decreased strength, Increased fascial restricitons, Pain, Impaired UE functional use, Postural dysfunction  Visit Diagnosis: 1. Stiffness of right elbow, not elsewhere classified   2. Muscle weakness (generalized)   3. Pain in right elbow        Problem List Patient Active Problem List   Diagnosis Date Noted  . Fracture of radial head, right, closed 04/08/2019  . GERD (gastroesophageal reflux disease) 01/26/2019  . Elbow contusion 01/26/2019  . [place] arthritis 10/21/2018  . Vitamin D deficiency 09/22/2018  . Cough 02/04/2017  . Rash and nonspecific skin eruption 07/23/2016  . Well adult exam 06/25/2016  . Situational anxiety  04/02/2016  . Viral URI with cough 01/23/2016  . Patellofemoral syndrome of both knees 01/03/2016  . Weight gain 12/28/2015  . Weakness generalized 05/24/2015  . Nonallopathic lesion of lumbosacral region 05/16/2015  . Nonallopathic lesion of thoracic region 05/16/2015  . SI (sacroiliac) joint dysfunction 03/28/2015  . Nonallopathic lesion of sacral region 03/28/2015  . Impingement syndrome of left shoulder 03/28/2015  . Leg cramping 09/29/2014  . Acute meniscal tear of right knee 07/28/2014  . Insomnia 03/13/2013  . Blurred vision 03/13/2013  . NECK PAIN 07/27/2009  . CHEST WALL PAIN 07/27/2009  . FIBROIDS, UTERUS 11/18/2007  . Essential hypertension 11/18/2007    Jule Ser, PT 06/09/2019, 2:24 PM  [facility] [address] [place], [place], [address] Phone: [phone]   Fax:  [phone]  Name: Sydney [name] MRN: [id number] Date of Birth: 07/08/1966

## 2019-06-12 ENCOUNTER — Encounter: Payer: Self-pay | Admitting: Physical Therapy

## 2019-06-12 ENCOUNTER — Other Ambulatory Visit: Payer: Self-pay

## 2019-06-12 ENCOUNTER — Ambulatory Visit: Payer: Federal, State, Local not specified - PPO | Admitting: Physical Therapy

## 2019-06-12 DIAGNOSIS — M25621 Stiffness of right elbow, not elsewhere classified: Secondary | ICD-10-CM

## 2019-06-12 DIAGNOSIS — M6281 Muscle weakness (generalized): Secondary | ICD-10-CM | POA: Diagnosis not present

## 2019-06-12 DIAGNOSIS — M25521 Pain in right elbow: Secondary | ICD-10-CM

## 2019-06-12 NOTE — Therapy (Signed)
[facility] [address] [place], [place], [address] Phone: [phone]   Fax:  [phone]  Physical Therapy Treatment  Patient Details  Name: Sydney [name] MRN: [id number] Date of Birth: 03/05/1966 Referring Provider (PT): Lyndal Pulley, D   Encounter Date: 06/12/2019  PT End of Session - 06/12/19 1532    Visit Number  11    Date for PT Re-Evaluation  06/17/19    PT Start Time  1532    PT Stop Time  1612    PT Time Calculation (min)  40 min    Activity Tolerance  Patient tolerated treatment well    Behavior During Therapy  [facility] for tasks assessed/performed       Past Medical History:  Diagnosis Date  . Hypertension     History reviewed. No pertinent surgical history.  There were no vitals filed for this visit.  Subjective Assessment - 06/12/19 1625    Subjective  Pt states she is doing well and there is no pain.    Currently in Pain?  No/denies         [facility] PT Assessment - 06/12/19 0001      AROM   Overall AROM Comments  -10                   [facility] Adult PT Treatment/Exercise - 06/12/19 0001      Shoulder Exercises: ROM/Strengthening   UBE (Upper Arm Bike)  L3 x 8 min (4 fwd/ 4 back) - sitting on ball with posture cues      Shoulder Exercises: Power [employer] Limitations  25 lb x 30 reps    Other Power Tower Exercises  elbow ext - 25lb x 30 reps      Shoulder Exercises: Body Blade   Flexion Limitations  shoulder 90 deg 3 x 10 sec    External Rotation Limitations  elbow bent at side ER/IR 3x 10       Manual Therapy   Soft tissue mobilization  Rt forearm and bicep - trigger point release and STM    Passive ROM  elbow extension and flexion, supination - Rt UE               PT Short Term Goals - 05/06/19 1326      PT SHORT TERM GOAL #2   Title  Pt will have only minimal tenderness of radial head and shaft on right side with palpation due to less trigger points  in forearm    Status  Achieved        PT Long Term Goals - 06/02/19 1414      PT LONG TERM GOAL #1   Title  Pt will be able to straighten Rt elbow to 0 deg neutral for improved functional reaching overhead    Baseline  -12 degrees    Time  8    Period  Weeks    Status  On-going            Plan - 06/12/19 1626    Clinical Impression Statement  Pt continues to progress with increased ROM today.  She was able to add more difficult exercises and was able to do the body blade exercises without increased pain.  She fatigues quickly doing more challenging exercises.  Pt will benefit from skilled PT to ensure she can achieve maximum ROM and discharge with HEP to progress strength independently.    PT Treatment/Interventions  ADLs/Self Care Home  Management;Cryotherapy;Electrical Stimulation;Moist Heat;Manual techniques;Dry needling;Taping;Passive range of motion;Patient/family education;Therapeutic exercise;Therapeutic activities;Neuromuscular re-education    PT Next Visit Plan  Rt UE strength, posture, elbow ROM flexion and extension    PT Home Exercise Plan   Access Code: [email or web address]     Consulted and Agree with Plan of Care  Patient       Patient will benefit from skilled therapeutic intervention in order to improve the following deficits and impairments:  Decreased range of motion, Decreased strength, Increased fascial restricitons, Pain, Impaired UE functional use, Postural dysfunction  Visit Diagnosis: 1. Stiffness of right elbow, not elsewhere classified   2. Muscle weakness (generalized)   3. Pain in right elbow        Problem List Patient Active Problem List   Diagnosis Date Noted  . Fracture of radial head, right, closed 04/08/2019  . GERD (gastroesophageal reflux disease) 01/26/2019  . Elbow contusion 01/26/2019  . [place] arthritis 10/21/2018  . Vitamin D deficiency 09/22/2018  . Cough 02/04/2017  . Rash and nonspecific skin eruption 07/23/2016  . Well adult exam  06/25/2016  . Situational anxiety 04/02/2016  . Viral URI with cough 01/23/2016  . Patellofemoral syndrome of both knees 01/03/2016  . Weight gain 12/28/2015  . Weakness generalized 05/24/2015  . Nonallopathic lesion of lumbosacral region 05/16/2015  . Nonallopathic lesion of thoracic region 05/16/2015  . SI (sacroiliac) joint dysfunction 03/28/2015  . Nonallopathic lesion of sacral region 03/28/2015  . Impingement syndrome of left shoulder 03/28/2015  . Leg cramping 09/29/2014  . Acute meniscal tear of right knee 07/28/2014  . Insomnia 03/13/2013  . Blurred vision 03/13/2013  . NECK PAIN 07/27/2009  . CHEST WALL PAIN 07/27/2009  . FIBROIDS, UTERUS 11/18/2007  . Essential hypertension 11/18/2007    Jule Ser, PT 06/12/2019, 4:29 PM  [facility] [address] [place], [place], [address] Phone: [phone]   Fax:  [phone]  Name: Sydney [name] MRN: [id number] Date of Birth: 25-Sep-1966

## 2019-06-18 ENCOUNTER — Ambulatory Visit: Payer: Federal, State, Local not specified - PPO | Admitting: Physical Therapy

## 2019-06-18 ENCOUNTER — Other Ambulatory Visit: Payer: Self-pay

## 2019-06-18 DIAGNOSIS — M6281 Muscle weakness (generalized): Secondary | ICD-10-CM | POA: Diagnosis not present

## 2019-06-18 DIAGNOSIS — M25521 Pain in right elbow: Secondary | ICD-10-CM | POA: Diagnosis not present

## 2019-06-18 DIAGNOSIS — M25621 Stiffness of right elbow, not elsewhere classified: Secondary | ICD-10-CM

## 2019-06-18 NOTE — Therapy (Signed)
[facility] [address], [place], [place], [address] Phone: [phone]   Fax:  [phone]  Physical Therapy Treatment  Patient Details  Name: Sydney [name] MRN: [id number] Date of Birth: [birth year] Referring Provider (PT): Lyndal Pulley, D   Encounter Date: 06/18/2019  PT End of Session - 06/18/19 1706    Visit Number  12    Date for PT Re-Evaluation  07/30/19    PT Start Time  1700    PT Stop Time  1745    PT Time Calculation (min)  45 min    Activity Tolerance  Patient tolerated treatment well    Behavior During Therapy  [facility] for tasks assessed/performed       Past Medical History:  Diagnosis Date  . Hypertension     No past surgical history on file.  There were no vitals filed for this visit.  Subjective Assessment - 06/18/19 1752    Subjective  Pt states overall doing well, pain is less when going to end ROM with elbow flexion.    Patient Stated Goals  be able to return to exercise and get full ROM     Currently in Pain?  No/denies         [facility] PT Assessment - 06/18/19 0001      Assessment   Medical Diagnosis  M25.521 (ICD-10-CM) - Right elbow pain    Referring Provider (PT)  Lyndal Pulley, D      AROM   Overall AROM Comments  -9                   [facility] Adult PT Treatment/Exercise - 06/18/19 0001      Shoulder Exercises: Standing   Extension  Strengthening;Both;20 reps;Weights   2 sets   Extension Weight (lbs)  25 lb - power tower     Row  Strengthening;Both;20 reps;Weights   2 sets   Row Weight (lbs)  30 lb - power tower - 30 reps      Manual Therapy   Soft tissue mobilization  Rt forearm and bicep - trigger point release and STM    Passive ROM  elbow extension and flexion, supination - Rt UE               PT Short Term Goals - 05/06/19 1326      PT SHORT TERM GOAL #2   Title  Pt will have only minimal tenderness of radial head and shaft on right  side with palpation due to less trigger points in forearm    Status  Achieved        PT Long Term Goals - 06/18/19 1743      PT LONG TERM GOAL #1   Title  Pt will be able to straighten Rt elbow to 0 deg neutral for improved functional reaching overhead    Baseline  -9 degrees    Time  6    Period  Weeks    Status  New    Target Date  07/30/19      PT LONG TERM GOAL #2   Title  Pt will be able to perform exercises with free weights such as tricep extension without increased pain    Status  Achieved      PT LONG TERM GOAL #3   Title  Pt will be ind with advanced HEP    Time  6    Period  Weeks    Status  New  Target Date  07/30/19      PT LONG TERM GOAL #4   Title  Pt will report pain 2/10 at most during all typical activities.    Baseline  no pain    Status  Achieved            Plan - 06/18/19 1749    Clinical Impression Statement  Pt continues to make progress with A/PROM with increased elbow extension.  She is currently 2% limited on FOTO score.  Pt has met most of her long term goals.  I am recommending 4 more PT visits so we can treat ROM more aggressively and return pt to maximum function.    PT Frequency  1x / week    PT Duration  6 weeks    PT Treatment/Interventions  ADLs/Self Care Home Management;Cryotherapy;Electrical Stimulation;Moist Heat;Manual techniques;Dry needling;Taping;Passive range of motion;Patient/family education;Therapeutic exercise;Therapeutic activities;Neuromuscular re-education    PT Next Visit Plan  elbow extension ROM, porgress HEP and UE strength as tolerated    PT Home Exercise Plan   Access Code: [email or web address]     Consulted and Agree with Plan of Care  Patient       Patient will benefit from skilled therapeutic intervention in order to improve the following deficits and impairments:  Decreased range of motion, Decreased strength, Increased fascial restricitons, Pain, Impaired UE functional use, Postural dysfunction  Visit Diagnosis: 1.  Stiffness of right elbow, not elsewhere classified   2. Muscle weakness (generalized)   3. Pain in right elbow        Problem List Patient Active Problem List   Diagnosis Date Noted  . Fracture of radial head, right, closed 04/08/2019  . GERD (gastroesophageal reflux disease) 01/26/2019  . Elbow contusion 01/26/2019  . [place] arthritis 10/21/2018  . Vitamin D deficiency 09/22/2018  . Cough 02/04/2017  . Rash and nonspecific skin eruption 07/23/2016  . Well adult exam 06/25/2016  . Situational anxiety 04/02/2016  . Viral URI with cough 01/23/2016  . Patellofemoral syndrome of both knees 01/03/2016  . Weight gain 12/28/2015  . Weakness generalized 05/24/2015  . Nonallopathic lesion of lumbosacral region 05/16/2015  . Nonallopathic lesion of thoracic region 05/16/2015  . SI (sacroiliac) joint dysfunction 03/28/2015  . Nonallopathic lesion of sacral region 03/28/2015  . Impingement syndrome of left shoulder 03/28/2015  . Leg cramping 09/29/2014  . Acute meniscal tear of right knee 07/28/2014  . Insomnia 03/13/2013  . Blurred vision 03/13/2013  . NECK PAIN 07/27/2009  . CHEST WALL PAIN 07/27/2009  . FIBROIDS, UTERUS 11/18/2007  . Essential hypertension 11/18/2007    Jule Ser, PT 06/18/2019, 5:58 PM  [facility] [address], [place], [place], [address] Phone: [phone]   Fax:  [phone]  Name: Sydney [name] MRN: [id number] Date of Birth: 12/11/1965

## 2019-06-24 ENCOUNTER — Other Ambulatory Visit: Payer: Self-pay

## 2019-06-24 ENCOUNTER — Ambulatory Visit: Payer: Federal, State, Local not specified - PPO | Admitting: Family Medicine

## 2019-06-24 ENCOUNTER — Ambulatory Visit (INDEPENDENT_AMBULATORY_CARE_PROVIDER_SITE_OTHER)
Admission: RE | Admit: 2019-06-24 | Discharge: 2019-06-24 | Disposition: A | Discharge status: Home / Self Care | Payer: Federal, State, Local not specified - PPO | Source: Ambulatory Visit | Attending: Family Medicine | Admitting: Family Medicine

## 2019-06-24 ENCOUNTER — Encounter: Payer: Self-pay | Admitting: Family Medicine

## 2019-06-24 VITALS — BP 102/64 | HR 59 | Ht 62.0 in | Wt 172.0 lb

## 2019-06-24 DIAGNOSIS — M25521 Pain in right elbow: Secondary | ICD-10-CM | POA: Diagnosis not present

## 2019-06-24 DIAGNOSIS — S52124G Nondisplaced fracture of head of right radius, subsequent encounter for closed fracture with delayed healing: Secondary | ICD-10-CM

## 2019-06-24 NOTE — Patient Instructions (Signed)
Finish PT if you want Xray downstairs See me if you need me

## 2019-06-24 NOTE — Progress Notes (Signed)
[name] [place], [place] [address] Phone: [phone] Subjective:   I Sydney [name] am serving as a Education administrator for Dr. Hulan Saas.  I'm seeing this patient by the request  of:    CC: Elbow pain follow-up  [id number]   04/08/2019 Patient did not first time I have seen this patient for this problem.  X-rays did show a radial head fracture.  Ultrasound today shows slow interval healing.  Patient is in minimal pain now and wants to increase range of motion.  I feel patient could start formal physical therapy will be referred.  We discussed icing regimen and home exercises.  Discussed the importance of trying to increase extension.  Worsening pain advanced imaging would be warranted.  Follow-up again in 4 weeks  06/24/2019 Sydney [name] is a 53 y.o. female coming in with complaint of elbow pain.  We do not have a radial pain fr//acture.  Has been going to physical therapy quite frequently. States her elbow feels good.  States feels like she is 95% better.  Has responded fairly well to conservative therapy.  Has done therapy.  States that she does still notice a lack of some range of motion but pain is completely gone at this time.    Past Medical History:  Diagnosis Date  . Hypertension    No past surgical history on file. Social History   Socioeconomic History  . Marital status: Single    Spouse name: Not on file  . Number of children: Not on file  . Years of education: Not on file  . Highest education level: Not on file  Occupational History  . Not on file  Social Needs  . Financial resource strain: Not on file  . Food insecurity    Worry: Not on file    Inability: Not on file  . Transportation needs    Medical: Not on file    Non-medical: Not on file  Tobacco Use  . Smoking status: Never Smoker  . Smokeless tobacco: Never Used  Substance and Sexual Activity  . Alcohol use: No  . Drug use: No  . Sexual activity: Not on  file  Lifestyle  . Physical activity    Days per week: Not on file    Minutes per session: Not on file  . Stress: Not on file  Relationships  . Social Herbalist on phone: Not on file    Gets together: Not on file    Attends religious service: Not on file    Active member of club or organization: Not on file    Attends meetings of clubs or organizations: Not on file    Relationship status: Not on file  Other Topics Concern  . Not on file  Social History Narrative  . Not on file   Allergies  Allergen Reactions  . Shellfish Allergy Hives    Facial urticaria  . Enalapril Maleate     REACTION: cough   No family history on file.   Current Outpatient Medications (Cardiovascular):  .  hydrochlorothiazide (HYDRODIURIL) 25 MG tablet, Take 1 tablet (25 mg total) by mouth daily. Marland Kitchen  losartan (COZAAR) 100 MG tablet, Take 1 tablet (100 mg total) by mouth daily.  Current Outpatient Medications (Respiratory):  .  loratadine (CLARITIN) 10 MG tablet, Take 1 tablet (10 mg total) by mouth daily.  Current Outpatient Medications (Analgesics):  .  meloxicam (MOBIC) 15 MG tablet, Take 1 tablet (15 mg  total) by mouth daily.  Current Outpatient Medications (Hematological):  .  ferrous sulfate 325 (65 FE) MG EC tablet, Take 1 tablet (325 mg total) by mouth daily with breakfast.  Current Outpatient Medications (Other):  Marland Kitchen  Cholecalciferol (VITAMIN D3) 50 MCG (2000 UT) capsule, Take 1 capsule (2,000 Units total) by mouth daily. .  cyclobenzaprine (FLEXERIL) 5 MG tablet, Take 1 tablet (5 mg total) by mouth at bedtime. .  pantoprazole (PROTONIX) 40 MG tablet, Take 1 tablet (40 mg total) by mouth daily. Marland Kitchen  saccharomyces boulardii (FLORASTOR) 250 MG capsule, Take 1 capsule (250 mg total) by mouth 2 (two) times daily. .  zaleplon (SONATA) 10 MG capsule, Take 1 capsule (10 mg total) by mouth at bedtime as needed for sleep.    Past medical history, social, surgical and family history all  reviewed in electronic medical record.  No pertanent information unless stated regarding to the chief complaint.   Review of Systems:  No headache, visual changes, nausea, vomiting, diarrhea, constipation, dizziness, abdominal pain, skin rash, fevers, chills, night sweats, weight loss, swollen lymph nodes, body aches, joint swelling, muscle aches, chest pain, shortness of breath, mood changes.   Objective  Blood pressure 102/64, pulse (!) 59, height 5\' 2"  (1.575 m), weight 172 lb (78 kg), last menstrual period 05/04/2012, SpO2 97 %.    General: No apparent distress alert and oriented x3 mood and affect normal, dressed appropriately.  HEENT: Pupils equal, extraocular movements intact  Respiratory: Patient's speak in full sentences and does not appear short of breath  Cardiovascular: No lower extremity edema, non tender, no erythema  Skin: Warm dry intact with no signs of infection or rash on extremities or on axial skeleton.  Abdomen: Soft nontender  Neuro: Cranial nerves II through XII are intact, neurovascularly intact in all extremities with 2+ DTRs and 2+ pulses.  Lymph: No lymphadenopathy of posterior or anterior cervical chain or axillae bilaterally.  Gait normal with good balance and coordination.  MSK:  Non tender with full range of motion and good stability and symmetric strength and tone of shoulders, wrist, hip, knee and ankles bilaterally.  Elbow: Right Unremarkable to inspection. Range of motion full pronation, supination, flexion, lacks the last 5 degrees of extension. Strength is full to all of the above directions Stable to varus, valgus stress. Negative moving valgus stress test. No discrete areas of tenderness to palpation. Ulnar nerve does not sublux. Negative cubital tunnel Tinel's.    Impression and Recommendations:     The above documentation has been reviewed and is accurate and complete Lyndal Pulley, DO       Note: This dictation was prepared with Dragon  dictation along with smaller phrase technology. Any transcriptional errors that result from this process are unintentional.

## 2019-06-24 NOTE — Assessment & Plan Note (Signed)
Patient has made significant progress but still lacks the last 5 degrees of extension.  Patient now has full pronation and supination noted.  I would like 1 more x-ray to make sure there is no loose bodies that could be contributing to the loss of range of motion.  Patient can do physical therapy as needed at this point.  Follow-up as needed.

## 2019-06-29 DIAGNOSIS — Z713 Dietary counseling and surveillance: Secondary | ICD-10-CM | POA: Diagnosis not present

## 2019-07-02 ENCOUNTER — Ambulatory Visit: Payer: Federal, State, Local not specified - PPO | Admitting: Physical Therapy

## 2019-07-07 ENCOUNTER — Other Ambulatory Visit: Payer: Self-pay

## 2019-07-07 ENCOUNTER — Ambulatory Visit: Payer: Federal, State, Local not specified - PPO | Attending: Family Medicine | Admitting: Physical Therapy

## 2019-07-07 ENCOUNTER — Encounter: Payer: Self-pay | Admitting: Physical Therapy

## 2019-07-07 DIAGNOSIS — M6281 Muscle weakness (generalized): Secondary | ICD-10-CM | POA: Insufficient documentation

## 2019-07-07 DIAGNOSIS — M25521 Pain in right elbow: Secondary | ICD-10-CM | POA: Insufficient documentation

## 2019-07-07 DIAGNOSIS — M25621 Stiffness of right elbow, not elsewhere classified: Secondary | ICD-10-CM | POA: Diagnosis not present

## 2019-07-07 NOTE — Therapy (Signed)
[facility] [address], [place], [place], [address] Phone: [phone]   Fax:  [phone]  Physical Therapy Treatment  Patient Details  Name: Sydney [name] MRN: [id number] Date of Birth: 02-26-1966 Referring Provider (PT): Lyndal Pulley, D   Encounter Date: 07/07/2019  PT End of Session - 07/07/19 1153    Visit Number  13    Date for PT Re-Evaluation  07/30/19    PT Start Time  1147    PT Stop Time  1225    PT Time Calculation (min)  38 min    Activity Tolerance  Patient tolerated treatment well    Behavior During Therapy  [facility] for tasks assessed/performed       Past Medical History:  Diagnosis Date  . Hypertension     History reviewed. No pertinent surgical history.  There were no vitals filed for this visit.  Subjective Assessment - 07/07/19 1234    Subjective  Pt reports she is able to reach her arm back behind her head better but still not straight and it is annoying.    Patient Stated Goals  be able to return to exercise and get full ROM     Currently in Pain?  No/denies                       [facility] Adult PT Treatment/Exercise - 07/07/19 0001      Shoulder Exercises: ROM/Strengthening   UBE (Upper Arm Bike)  L3 x 8 min (4 fwd/ 4 back) - sitting on ball with posture cues      Manual Therapy   Soft tissue mobilization  Rt forearm and bicep - trigger point release and STM    Passive ROM  elbow extension and flexion, supination - Rt UE               PT Short Term Goals - 05/06/19 1326      PT SHORT TERM GOAL #2   Title  Pt will have only minimal tenderness of radial head and shaft on right side with palpation due to less trigger points in forearm    Status  Achieved        PT Long Term Goals - 07/07/19 1159      PT LONG TERM GOAL #1   Title  Pt will be able to straighten Rt elbow to 0 deg neutral for improved functional reaching overhead    Status  On-going             Plan - 07/07/19 1156    Clinical Impression Statement  Today's session focused on ROM.  Pt is doing well with HEP.  She continues to lack some ROM in elbow that is effecting her usage of that side. Pt will benefit from a few more visit to continue to progress ROM.    PT Treatment/Interventions  ADLs/Self Care Home Management;Cryotherapy;Electrical Stimulation;Moist Heat;Manual techniques;Dry needling;Taping;Passive range of motion;Patient/family education;Therapeutic exercise;Therapeutic activities;Neuromuscular re-education    PT Next Visit Plan  elbow extension ROM, porgress HEP and UE strength as tolerated    PT Home Exercise Plan   Access Code: [email or web address]     Consulted and Agree with Plan of Care  Patient       Patient will benefit from skilled therapeutic intervention in order to improve the following deficits and impairments:  Decreased range of motion, Decreased strength, Increased fascial restricitons, Pain, Impaired UE functional use, Postural dysfunction  Visit Diagnosis: 1. Stiffness of  right elbow, not elsewhere classified   2. Muscle weakness (generalized)   3. Pain in right elbow        Problem List Patient Active Problem List   Diagnosis Date Noted  . Fracture of radial head, right, closed 04/08/2019  . GERD (gastroesophageal reflux disease) 01/26/2019  . Elbow contusion 01/26/2019  . [place] arthritis 10/21/2018  . Vitamin D deficiency 09/22/2018  . Cough 02/04/2017  . Rash and nonspecific skin eruption 07/23/2016  . Well adult exam 06/25/2016  . Situational anxiety 04/02/2016  . Viral URI with cough 01/23/2016  . Patellofemoral syndrome of both knees 01/03/2016  . Weight gain 12/28/2015  . Weakness generalized 05/24/2015  . Nonallopathic lesion of lumbosacral region 05/16/2015  . Nonallopathic lesion of thoracic region 05/16/2015  . SI (sacroiliac) joint dysfunction 03/28/2015  . Nonallopathic lesion of sacral region 03/28/2015  . Impingement  syndrome of left shoulder 03/28/2015  . Leg cramping 09/29/2014  . Acute meniscal tear of right knee 07/28/2014  . Insomnia 03/13/2013  . Blurred vision 03/13/2013  . NECK PAIN 07/27/2009  . CHEST WALL PAIN 07/27/2009  . FIBROIDS, UTERUS 11/18/2007  . Essential hypertension 11/18/2007    Jule Ser, PT 07/07/2019, 12:36 PM  [facility] [address] [place], [place], [address] Phone: [phone]   Fax:  [phone]  Name: Dakari Stabler MRN: [id number] Date of Birth: 07-11-1966

## 2019-07-08 ENCOUNTER — Encounter: Payer: Federal, State, Local not specified - PPO | Admitting: Physical Therapy

## 2019-07-15 ENCOUNTER — Other Ambulatory Visit: Payer: Self-pay

## 2019-07-15 ENCOUNTER — Ambulatory Visit: Payer: Federal, State, Local not specified - PPO | Admitting: Physical Therapy

## 2019-07-15 ENCOUNTER — Encounter: Payer: Self-pay | Admitting: Physical Therapy

## 2019-07-15 DIAGNOSIS — M25621 Stiffness of right elbow, not elsewhere classified: Secondary | ICD-10-CM | POA: Diagnosis not present

## 2019-07-15 DIAGNOSIS — M25521 Pain in right elbow: Secondary | ICD-10-CM

## 2019-07-15 DIAGNOSIS — M6281 Muscle weakness (generalized): Secondary | ICD-10-CM | POA: Diagnosis not present

## 2019-07-15 NOTE — Therapy (Signed)
[facility] [address] [place], [place], [address] Phone: [phone]   Fax:  [phone]  Physical Therapy Treatment  Patient Details  Name: Sydney [name] MRN: [id number] Date of Birth: 1966-06-27 Referring Provider (PT): Lyndal Pulley, D   Encounter Date: 07/15/2019  PT End of Session - 07/15/19 1402    Visit Number  14    Date for PT Re-Evaluation  07/30/19    PT Start Time  1884    PT Stop Time  1440    PT Time Calculation (min)  38 min    Activity Tolerance  Patient tolerated treatment well    Behavior During Therapy  [facility] for tasks assessed/performed       Past Medical History:  Diagnosis Date  . Hypertension     History reviewed. No pertinent surgical history.  There were no vitals filed for this visit.  Subjective Assessment - 07/15/19 1404    Subjective  Pt has no new complaints    Patient Stated Goals  be able to return to exercise and get full ROM     Currently in Pain?  No/denies         [facility] PT Assessment - 07/15/19 0001      AROM   Overall AROM Comments  -7                   [facility] Adult PT Treatment/Exercise - 07/15/19 0001      Shoulder Exercises: ROM/Strengthening   UBE (Upper Arm Bike)  L3 x 8 min (4 fwd/ 4 back) - standing with posture cues      Shoulder Exercises: Body Blade   Flexion Limitations  flex/ext shoulder 90 deg 3 x 10 sec    ABduction Limitations  horizontal ab/ad at 90 deg - 3 x 10 sec    External Rotation Limitations  elbow bent at side ER/IR 3x 10 sec      Manual Therapy   Soft tissue mobilization  Rt forearm and bicep - trigger point release and STM    Passive ROM  elbow extension and flexion, supination - Rt UE               PT Short Term Goals - 05/06/19 1326      PT SHORT TERM GOAL #2   Title  Pt will have only minimal tenderness of radial head and shaft on right side with palpation due to less trigger points in forearm    Status  Achieved        PT Long Term Goals - 07/07/19 1159      PT LONG TERM GOAL #1   Title  Pt will be able to straighten Rt elbow to 0 deg neutral for improved functional reaching overhead    Status  On-going            Plan - 07/15/19 1443    Clinical Impression Statement  Pt is 7 degrees from neutral elbow extension, which is a 2 degree improvement.  Pt was able to tolerate body blade with increased time in all positions.  She continues to make imrpovements working towards maximum functional outcomes.    PT Treatment/Interventions  ADLs/Self Care Home Management;Cryotherapy;Electrical Stimulation;Moist Heat;Manual techniques;Dry needling;Taping;Passive range of motion;Patient/family education;Therapeutic exercise;Therapeutic activities;Neuromuscular re-education    PT Next Visit Plan  elbow extension ROM, porgress HEP and UE strength as tolerated    PT Home Exercise Plan   Access Code: [email or web address]     Consulted  and Agree with Plan of Care  Patient       Patient will benefit from skilled therapeutic intervention in order to improve the following deficits and impairments:  Decreased range of motion, Decreased strength, Increased fascial restricitons, Pain, Impaired UE functional use, Postural dysfunction  Visit Diagnosis: 1. Stiffness of right elbow, not elsewhere classified   2. Muscle weakness (generalized)   3. Pain in right elbow        Problem List Patient Active Problem List   Diagnosis Date Noted  . Fracture of radial head, right, closed 04/08/2019  . GERD (gastroesophageal reflux disease) 01/26/2019  . Elbow contusion 01/26/2019  . [place] arthritis 10/21/2018  . Vitamin D deficiency 09/22/2018  . Cough 02/04/2017  . Rash and nonspecific skin eruption 07/23/2016  . Well adult exam 06/25/2016  . Situational anxiety 04/02/2016  . Viral URI with cough 01/23/2016  . Patellofemoral syndrome of both knees 01/03/2016  . Weight gain 12/28/2015  . Weakness generalized  05/24/2015  . Nonallopathic lesion of lumbosacral region 05/16/2015  . Nonallopathic lesion of thoracic region 05/16/2015  . SI (sacroiliac) joint dysfunction 03/28/2015  . Nonallopathic lesion of sacral region 03/28/2015  . Impingement syndrome of left shoulder 03/28/2015  . Leg cramping 09/29/2014  . Acute meniscal tear of right knee 07/28/2014  . Insomnia 03/13/2013  . Blurred vision 03/13/2013  . NECK PAIN 07/27/2009  . CHEST WALL PAIN 07/27/2009  . FIBROIDS, UTERUS 11/18/2007  . Essential hypertension 11/18/2007    Jule Ser, PT 07/15/2019, 2:46 PM  [facility] [address], [place], [place], [address] Phone: [phone]   Fax:  [phone]  Name: Sydney [name] MRN: [id number] Date of Birth: 1966-07-26

## 2019-07-22 ENCOUNTER — Other Ambulatory Visit: Payer: Self-pay

## 2019-07-22 ENCOUNTER — Ambulatory Visit: Payer: Federal, State, Local not specified - PPO | Admitting: Physical Therapy

## 2019-07-22 ENCOUNTER — Encounter: Payer: Self-pay | Admitting: Physical Therapy

## 2019-07-22 DIAGNOSIS — M25521 Pain in right elbow: Secondary | ICD-10-CM | POA: Diagnosis not present

## 2019-07-22 DIAGNOSIS — M25621 Stiffness of right elbow, not elsewhere classified: Secondary | ICD-10-CM

## 2019-07-22 DIAGNOSIS — M6281 Muscle weakness (generalized): Secondary | ICD-10-CM | POA: Diagnosis not present

## 2019-07-22 NOTE — Therapy (Signed)
[facility] [address] [place], [place], [address] Phone: [phone]   Fax:  [phone]  Physical Therapy Treatment  Patient Details  Name: Sydney [name] MRN: [id number] Date of Birth: 1966/09/03 Referring Provider (PT): Lyndal Pulley, D   Encounter Date: 07/22/2019  PT End of Session - 07/22/19 1233    Visit Number  15    Date for PT Re-Evaluation  07/30/19    PT Start Time  1231    PT Stop Time  1310    PT Time Calculation (min)  39 min    Activity Tolerance  Patient tolerated treatment well    Behavior During Therapy  [facility] for tasks assessed/performed       Past Medical History:  Diagnosis Date  . Hypertension     History reviewed. No pertinent surgical history.  There were no vitals filed for this visit.  Subjective Assessment - 07/22/19 1316    Subjective  Pt has no new complaints    Currently in Pain?  No/denies                       [facility] Adult PT Treatment/Exercise - 07/22/19 0001      Shoulder Exercises: Standing   Flexion  Strengthening;Right;Left;10 reps;Weights    Shoulder Flexion Weight (lbs)  4    ABduction  Strengthening;Left;Right;10 reps;Weights    Shoulder ABduction Weight (lbs)  4    Other Standing Exercises  tricep dips on mat 8 reps    Other Standing Exercises  tricep push up on mat - 10x      Shoulder Exercises: Body Blade   Flexion Limitations  flex/ext shoulder 90 deg 3 x 15 sec    ABduction Limitations  horizontal ab/ad at 90 deg - 3 x 15 sec    External Rotation Limitations  elbow bent at side ER/IR 3x 15 sec      Manual Therapy   Soft tissue mobilization  Rt forearm and bicep - trigger point release and STM    Passive ROM  elbow extension and flexion, supination - Rt UE               PT Short Term Goals - 05/06/19 1326      PT SHORT TERM GOAL #2   Title  Pt will have only minimal tenderness of radial head and shaft on right side with  palpation due to less trigger points in forearm    Status  Achieved        PT Long Term Goals - 07/22/19 1233      PT LONG TERM GOAL #1   Title  Pt will be able to straighten Rt elbow to 0 deg neutral for improved functional reaching overhead    Status  On-going      PT LONG TERM GOAL #3   Title  Pt will be ind with advanced HEP    Status  On-going            Plan - 07/22/19 1310    Clinical Impression Statement  Pt is doing well with HEP.  She was able to progress difficulty and endurance.  No increased pain.  Manual treatment continued to focus on increased ROM and will benefit from one more session to finalize HEP and ensure maximum ROM for functional reaching.    PT Treatment/Interventions  ADLs/Self Care Home Management;Cryotherapy;Electrical Stimulation;Moist Heat;Manual techniques;Dry needling;Taping;Passive range of motion;Patient/family education;Therapeutic exercise;Therapeutic activities;Neuromuscular re-education    PT  Next Visit Plan  ROM, HEP, d/c    PT Home Exercise Plan   Access Code: [email or web address]     Consulted and Agree with Plan of Care  Patient       Patient will benefit from skilled therapeutic intervention in order to improve the following deficits and impairments:  Decreased range of motion, Decreased strength, Increased fascial restricitons, Pain, Impaired UE functional use, Postural dysfunction  Visit Diagnosis: 1. Stiffness of right elbow, not elsewhere classified   2. Muscle weakness (generalized)   3. Pain in right elbow        Problem List Patient Active Problem List   Diagnosis Date Noted  . Fracture of radial head, right, closed 04/08/2019  . GERD (gastroesophageal reflux disease) 01/26/2019  . Elbow contusion 01/26/2019  . [place] arthritis 10/21/2018  . Vitamin D deficiency 09/22/2018  . Cough 02/04/2017  . Rash and nonspecific skin eruption 07/23/2016  . Well adult exam 06/25/2016  . Situational anxiety 04/02/2016  . Viral URI with cough  01/23/2016  . Patellofemoral syndrome of both knees 01/03/2016  . Weight gain 12/28/2015  . Weakness generalized 05/24/2015  . Nonallopathic lesion of lumbosacral region 05/16/2015  . Nonallopathic lesion of thoracic region 05/16/2015  . SI (sacroiliac) joint dysfunction 03/28/2015  . Nonallopathic lesion of sacral region 03/28/2015  . Impingement syndrome of left shoulder 03/28/2015  . Leg cramping 09/29/2014  . Acute meniscal tear of right knee 07/28/2014  . Insomnia 03/13/2013  . Blurred vision 03/13/2013  . NECK PAIN 07/27/2009  . CHEST WALL PAIN 07/27/2009  . FIBROIDS, UTERUS 11/18/2007  . Essential hypertension 11/18/2007    Jule Ser, PT 07/22/2019, 1:16 PM  [facility] [address] [place], [place], [address] Phone: [phone]   Fax:  [phone]  Name: Sydney [name] MRN: [id number] Date of Birth: 1966-04-24

## 2019-07-27 ENCOUNTER — Other Ambulatory Visit: Payer: Self-pay

## 2019-07-27 ENCOUNTER — Ambulatory Visit (INDEPENDENT_AMBULATORY_CARE_PROVIDER_SITE_OTHER): Payer: Federal, State, Local not specified - PPO | Admitting: Internal Medicine

## 2019-07-27 ENCOUNTER — Encounter: Payer: Self-pay | Admitting: Internal Medicine

## 2019-07-27 VITALS — BP 130/60 | HR 72 | Temp 99.0°F | Ht 62.0 in | Wt 174.0 lb

## 2019-07-27 DIAGNOSIS — Z0000 Encounter for general adult medical examination without abnormal findings: Secondary | ICD-10-CM | POA: Diagnosis not present

## 2019-07-27 DIAGNOSIS — I10 Essential (primary) hypertension: Secondary | ICD-10-CM

## 2019-07-27 DIAGNOSIS — E559 Vitamin D deficiency, unspecified: Secondary | ICD-10-CM | POA: Diagnosis not present

## 2019-07-27 DIAGNOSIS — G4700 Insomnia, unspecified: Secondary | ICD-10-CM | POA: Diagnosis not present

## 2019-07-27 MED ORDER — ESZOPICLONE 2 MG PO TABS: 1.0000 mg | ORAL_TABLET | Freq: Every evening | ORAL | 3 refills | Status: DC | PRN

## 2019-07-27 NOTE — Assessment & Plan Note (Signed)
Try Johnnye Sima

## 2019-07-27 NOTE — Patient Instructions (Signed)

## 2019-07-27 NOTE — Assessment & Plan Note (Signed)
Risks associated with treatment noncompliance were discussed. Compliance was encouraged. Vit D 

## 2019-07-27 NOTE — Assessment & Plan Note (Signed)
Labs

## 2019-07-27 NOTE — Progress Notes (Signed)
Subjective:  Patient ID: Sydney [name], female    DOB: 1966-12-03  Age: 53 y.o. MRN: [id number]  CC: No chief complaint on file.   HPI Sydney [name] presents for HTN, anemia, allergies, insomnia f/u  Outpatient Medications Prior to Visit  Medication Sig Dispense Refill  . Cholecalciferol (VITAMIN D3) 50 MCG (2000 UT) capsule Take 1 capsule (2,000 Units total) by mouth daily. 100 capsule 3  . cyclobenzaprine (FLEXERIL) 5 MG tablet Take 1 tablet (5 mg total) by mouth at bedtime. 30 tablet 1  . ferrous sulfate 325 (65 FE) MG EC tablet Take 1 tablet (325 mg total) by mouth daily with breakfast. 90 tablet 3  . hydrochlorothiazide (HYDRODIURIL) 25 MG tablet Take 1 tablet (25 mg total) by mouth daily. 90 tablet 3  . loratadine (CLARITIN) 10 MG tablet Take 1 tablet (10 mg total) by mouth daily. 90 tablet 0  . losartan (COZAAR) 100 MG tablet Take 1 tablet (100 mg total) by mouth daily. 90 tablet 3  . meloxicam (MOBIC) 15 MG tablet Take 1 tablet (15 mg total) by mouth daily. 30 tablet 0  . pantoprazole (PROTONIX) 40 MG tablet Take 1 tablet (40 mg total) by mouth daily. 30 tablet 3  . saccharomyces boulardii (FLORASTOR) 250 MG capsule Take 1 capsule (250 mg total) by mouth 2 (two) times daily. 30 capsule 1  . zaleplon (SONATA) 10 MG capsule Take 1 capsule (10 mg total) by mouth at bedtime as needed for sleep. 30 capsule 1   No facility-administered medications prior to visit.     ROS: Review of Systems  Constitutional: Negative for activity change, appetite change, chills, fatigue and unexpected weight change.  HENT: Negative for congestion, mouth sores and sinus pressure.   Eyes: Negative for visual disturbance.  Respiratory: Negative for cough and chest tightness.   Gastrointestinal: Negative for abdominal pain and nausea.  Genitourinary: Negative for difficulty urinating, frequency and vaginal pain.  Musculoskeletal: Negative for back pain and gait problem.  Skin: Negative for pallor  and rash.  Neurological: Negative for dizziness, tremors, weakness, numbness and headaches.  Psychiatric/Behavioral: Positive for sleep disturbance. Negative for confusion and suicidal ideas.    Objective:  BP 130/60 (BP Location: Left Arm, Patient Position: Sitting, Cuff Size: Large)   Pulse 72   Temp 99 F (37.2 C) (Oral)   Ht 5\' 2"  (1.575 m)   Wt 174 lb (78.9 kg)   LMP 05/04/2012   SpO2 97%   BMI 31.83 kg/m   BP Readings from Last 3 Encounters:  07/27/19 130/60  06/24/19 102/64  04/08/19 112/80    Wt Readings from Last 3 Encounters:  07/27/19 174 lb (78.9 kg)  06/24/19 172 lb (78 kg)  04/08/19 174 lb (78.9 kg)    Physical Exam Constitutional:      General: She is not in acute distress.    Appearance: She is well-developed.  HENT:     Head: Normocephalic.     Right Ear: External ear normal.     Left Ear: External ear normal.     Nose: Nose normal.  Eyes:     General:        Right eye: No discharge.        Left eye: No discharge.     Conjunctiva/sclera: Conjunctivae normal.     Pupils: Pupils are equal, round, and reactive to light.  Neck:     Musculoskeletal: Normal range of motion and neck supple.     Thyroid: No thyromegaly.  Vascular: No JVD.     Trachea: No tracheal deviation.  Cardiovascular:     Rate and Rhythm: Normal rate and regular rhythm.     Heart sounds: Normal heart sounds.  Pulmonary:     Effort: No respiratory distress.     Breath sounds: No stridor. No wheezing.  Abdominal:     General: Bowel sounds are normal. There is no distension.     Palpations: Abdomen is soft. There is no mass.     Tenderness: There is no abdominal tenderness. There is no guarding or rebound.  Musculoskeletal:        General: No tenderness.  Lymphadenopathy:     Cervical: No cervical adenopathy.  Skin:    Findings: No erythema or rash.  Neurological:     Cranial Nerves: No cranial nerve deficit.     Motor: No abnormal muscle tone.     Coordination:  Coordination normal.     Deep Tendon Reflexes: Reflexes normal.  Psychiatric:        Behavior: Behavior normal.        Thought Content: Thought content normal.        Judgment: Judgment normal.     Lab Results  Component Value Date   WBC 4.4 03/24/2018   HGB 13.8 03/24/2018   HCT 41.0 03/24/2018   PLT 294.0 03/24/2018   GLUCOSE 89 03/24/2018   CHOL 167 03/24/2018   TRIG 153.0 (H) 03/24/2018   HDL 69.50 03/24/2018   LDLCALC 67 03/24/2018   ALT 19 03/24/2018   AST 19 03/24/2018   NA 139 03/24/2018   K 3.7 03/24/2018   CL 99 03/24/2018   CREATININE 0.72 03/24/2018   BUN 11 03/24/2018   CO2 32 03/24/2018   TSH 1.61 03/24/2018   HGBA1C 6.1 10/13/2010    Dg Elbow Complete Right  Result Date: 06/25/2019 CLINICAL DATA:  Right elbow pain. Previous radial head fracture. EXAM: RIGHT ELBOW - COMPLETE 3+ VIEW COMPARISON:  Most recent comparison 04/08/2019 FINDINGS: No significant change in alignment of the intra-articular radial head fracture. Fracture line remains visible. The joint effusion has resolved. No new osseous abnormality. IMPRESSION: No significant change in alignment of the intra-articular radial head fracture. Fracture line remains visible. Joint effusion has resolved. Electronically Signed   By: Keith Rake M.D.   On: 06/25/2019 03:48    Assessment & Plan:   There are no diagnoses linked to this encounter.   No orders of the defined types were placed in this encounter.    Follow-up: No follow-ups on file.  Walker Kehr, MD

## 2019-07-30 ENCOUNTER — Encounter: Payer: Self-pay | Admitting: Physical Therapy

## 2019-07-30 ENCOUNTER — Ambulatory Visit: Payer: Federal, State, Local not specified - PPO | Admitting: Physical Therapy

## 2019-07-30 ENCOUNTER — Other Ambulatory Visit: Payer: Self-pay

## 2019-07-30 DIAGNOSIS — M25521 Pain in right elbow: Secondary | ICD-10-CM | POA: Diagnosis not present

## 2019-07-30 DIAGNOSIS — M25621 Stiffness of right elbow, not elsewhere classified: Secondary | ICD-10-CM | POA: Diagnosis not present

## 2019-07-30 DIAGNOSIS — M6281 Muscle weakness (generalized): Secondary | ICD-10-CM

## 2019-07-30 NOTE — Therapy (Signed)
[facility] [address] [place], [place], [address] Phone: [phone]   Fax:  [phone]  Physical Therapy Treatment  Patient Details  Name: Sydney [name] MRN: [id number] Date of Birth: 12-09-1965 Referring Provider (PT): Lyndal Pulley, D   Encounter Date: 07/30/2019  PT End of Session - 07/30/19 1447    Visit Number  16    Date for PT Re-Evaluation  07/30/19    PT Start Time  1447    PT Stop Time  1526    PT Time Calculation (min)  39 min    Activity Tolerance  Patient tolerated treatment well    Behavior During Therapy  [facility] for tasks assessed/performed       Past Medical History:  Diagnosis Date  . Hypertension     History reviewed. No pertinent surgical history.  There were no vitals filed for this visit.  Subjective Assessment - 07/30/19 1537    Subjective  No pain and I feel just a little tight in the elbow.  I can do everything I need to with the arm    Patient Stated Goals  be able to return to exercise and get full ROM     Currently in Pain?  No/denies                       [facility] Adult PT Treatment/Exercise - 07/30/19 0001      Shoulder Exercises: Standing   Flexion  Strengthening;Right;Left;10 reps;Weights    Shoulder Flexion Weight (lbs)  4    Flexion Limitations  sitting on green pball    Other Standing Exercises  elbow flexion thumbs up 10x; palms up 10x - sitting on green pball      Shoulder Exercises: Therapy Ball   Other Therapy Ball Exercises  push up on ball resting - 2x8 reps      Shoulder Exercises: ROM/Strengthening   UBE (Upper Arm Bike)  L3 x 8 min (4 fwd/ 4 back) - standing with posture cues      Shoulder Exercises: Body Blade   Flexion Limitations  flex/ext shoulder 90 deg 3 x 15 sec    ABduction Limitations  horizontal ab/ad at 90 deg - 3 x 15 sec    External Rotation Limitations  elbow bent at side ER/IR 3x 15 sec      Manual Therapy   Soft  tissue mobilization  Rt forearm and bicep - trigger point release and STM    Passive ROM  elbow extension and flexion, supination - Rt UE               PT Short Term Goals - 05/06/19 1326      PT SHORT TERM GOAL #2   Title  Pt will have only minimal tenderness of radial head and shaft on right side with palpation due to less trigger points in forearm    Status  Achieved        PT Long Term Goals - 07/30/19 1457      PT LONG TERM GOAL #1   Title  Pt will be able to straighten Rt elbow to 0 deg neutral for improved functional reaching overhead    Baseline  -8 degrees    Status  Partially Met      PT LONG TERM GOAL #2   Title  Pt will be able to perform exercises with free weights such as tricep extension without increased pain    Status  Achieved      PT LONG TERM GOAL #3   Title  Pt will be ind with advanced HEP    Status  Achieved      PT LONG TERM GOAL #4   Title  Pt will report pain 2/10 at most during all typical activities.    Status  Achieved            Plan - 07/30/19 1536    Clinical Impression Statement  Pt almost met goal to get elbow to neutral.  It is 3 degrees off from other side.  Pt will discharge today with HEP to continue working on ROM and strength at home    PT Treatment/Interventions  ADLs/Self Care Home Management;Cryotherapy;Electrical Stimulation;Moist Heat;Manual techniques;Dry needling;Taping;Passive range of motion;Patient/family education;Therapeutic exercise;Therapeutic activities;Neuromuscular re-education    PT Home Exercise Plan   Access Code: [email or web address]     Consulted and Agree with Plan of Care  Patient       Patient will benefit from skilled therapeutic intervention in order to improve the following deficits and impairments:  Decreased range of motion, Decreased strength, Increased fascial restricitons, Pain, Impaired UE functional use, Postural dysfunction  Visit Diagnosis: Stiffness of right elbow, not elsewhere  classified  Muscle weakness (generalized)  Pain in right elbow     Problem List Patient Active Problem List   Diagnosis Date Noted  . Fracture of radial head, right, closed 04/08/2019  . GERD (gastroesophageal reflux disease) 01/26/2019  . Elbow contusion 01/26/2019  . [place] arthritis 10/21/2018  . Vitamin D deficiency 09/22/2018  . Cough 02/04/2017  . Rash and nonspecific skin eruption 07/23/2016  . Well adult exam 06/25/2016  . Situational anxiety 04/02/2016  . Viral URI with cough 01/23/2016  . Patellofemoral syndrome of both knees 01/03/2016  . Weight gain 12/28/2015  . Weakness generalized 05/24/2015  . Nonallopathic lesion of lumbosacral region 05/16/2015  . Nonallopathic lesion of thoracic region 05/16/2015  . SI (sacroiliac) joint dysfunction 03/28/2015  . Nonallopathic lesion of sacral region 03/28/2015  . Impingement syndrome of left shoulder 03/28/2015  . Leg cramping 09/29/2014  . Acute meniscal tear of right knee 07/28/2014  . Insomnia 03/13/2013  . Blurred vision 03/13/2013  . NECK PAIN 07/27/2009  . CHEST WALL PAIN 07/27/2009  . FIBROIDS, UTERUS 11/18/2007  . Essential hypertension 11/18/2007    Jule Ser, PT 07/30/2019, 3:45 PM  [facility] [address] [place], [place], [address] Phone: [phone]   Fax:  [phone]  Name: Sydney [name] MRN: [id number] Date of Birth: 31-Mar-1966  PHYSICAL THERAPY DISCHARGE SUMMARY  Visits from Start of Care: 16  Current functional level related to goals / functional outcomes: See above details   Remaining deficits: See above   Education / Equipment: HEP  Plan: Patient agrees to discharge.  Patient goals were partially met. Patient is being discharged due to being pleased with the current functional level.  ?????     [employer], PT 07/30/19 3:45 PM

## 2019-11-02 ENCOUNTER — Other Ambulatory Visit (INDEPENDENT_AMBULATORY_CARE_PROVIDER_SITE_OTHER): Payer: Federal, State, Local not specified - PPO

## 2019-11-02 DIAGNOSIS — Z0000 Encounter for general adult medical examination without abnormal findings: Secondary | ICD-10-CM | POA: Diagnosis not present

## 2019-11-02 LAB — URINALYSIS
Bilirubin Urine: NEGATIVE
Hgb urine dipstick: NEGATIVE
Ketones, ur: NEGATIVE
Leukocytes,Ua: NEGATIVE
Nitrite: NEGATIVE
Specific Gravity, Urine: 1.02 (ref 1.000–1.030)
Total Protein, Urine: NEGATIVE
Urine Glucose: NEGATIVE
Urobilinogen, UA: 0.2 (ref 0.0–1.0)
pH: 7.5 (ref 5.0–8.0)

## 2019-11-02 LAB — CBC WITH DIFFERENTIAL/PLATELET
Basophils Absolute: 0 10*3/uL (ref 0.0–0.1)
Basophils Relative: 0.9 % (ref 0.0–3.0)
Eosinophils Absolute: 0.1 10*3/uL (ref 0.0–0.7)
Eosinophils Relative: 1.9 % (ref 0.0–5.0)
HCT: 38.9 % (ref 36.0–46.0)
Hemoglobin: 13 g/dL (ref 12.0–15.0)
Lymphocytes Relative: 44.9 % (ref 12.0–46.0)
Lymphs Abs: 2.1 10*3/uL (ref 0.7–4.0)
MCHC: 33.5 g/dL (ref 30.0–36.0)
MCV: 85.1 fl (ref 78.0–100.0)
Monocytes Absolute: 0.6 10*3/uL (ref 0.1–1.0)
Monocytes Relative: 12.3 % — ABNORMAL HIGH (ref 3.0–12.0)
Neutro Abs: 1.9 10*3/uL (ref 1.4–7.7)
Neutrophils Relative %: 40 % — ABNORMAL LOW (ref 43.0–77.0)
Platelets: 234 10*3/uL (ref 150.0–400.0)
RBC: 4.57 Mil/uL (ref 3.87–5.11)
RDW: 13.9 % (ref 11.5–15.5)
WBC: 4.6 10*3/uL (ref 4.0–10.5)

## 2019-11-02 LAB — BASIC METABOLIC PANEL
BUN: 19 mg/dL (ref 6–23)
CO2: 32 mEq/L (ref 19–32)
Calcium: 9.5 mg/dL (ref 8.4–10.5)
Chloride: 101 mEq/L (ref 96–112)
Creatinine, Ser: 0.87 mg/dL (ref 0.40–1.20)
GFR: 82.93 mL/min (ref >60.00)
Glucose, Bld: 81 mg/dL (ref 70–99)
Potassium: 3.4 mEq/L — ABNORMAL LOW (ref 3.5–5.1)
Sodium: 139 mEq/L (ref 135–145)

## 2019-11-02 LAB — HEPATIC FUNCTION PANEL
ALT: 20 U/L (ref 0–35)
AST: 16 U/L (ref 0–37)
Albumin: 3.9 g/dL (ref 3.5–5.2)
Alkaline Phosphatase: 76 U/L (ref 39–117)
Bilirubin, Direct: 0 mg/dL (ref 0.0–0.3)
Total Bilirubin: 0.3 mg/dL (ref 0.2–1.2)
Total Protein: 7.1 g/dL (ref 6.0–8.3)

## 2019-11-02 LAB — LIPID PANEL
Cholesterol: 180 mg/dL (ref 0–200)
HDL: 59.4 mg/dL (ref >39.00)
NonHDL: 121.05
Total CHOL/HDL Ratio: 3
Triglycerides: 203 mg/dL — ABNORMAL HIGH (ref 0.0–149.0)
VLDL: 40.6 mg/dL — ABNORMAL HIGH (ref 0.0–40.0)

## 2019-11-02 LAB — TSH: TSH: 1.62 u[IU]/mL (ref 0.35–4.50)

## 2019-11-02 LAB — LDL CHOLESTEROL, DIRECT: Direct LDL: 73 mg/dL

## 2019-11-19 DIAGNOSIS — Z01419 Encounter for gynecological examination (general) (routine) without abnormal findings: Secondary | ICD-10-CM | POA: Diagnosis not present

## 2019-11-19 DIAGNOSIS — Z1231 Encounter for screening mammogram for malignant neoplasm of breast: Secondary | ICD-10-CM | POA: Diagnosis not present

## 2019-11-19 DIAGNOSIS — Z6831 Body mass index (BMI) 31.0-31.9, adult: Secondary | ICD-10-CM | POA: Diagnosis not present

## 2019-11-19 DIAGNOSIS — R87619 Unspecified abnormal cytological findings in specimens from cervix uteri: Secondary | ICD-10-CM | POA: Insufficient documentation

## 2019-11-19 DIAGNOSIS — Z139 Encounter for screening, unspecified: Secondary | ICD-10-CM | POA: Diagnosis not present

## 2019-11-23 ENCOUNTER — Other Ambulatory Visit: Payer: Self-pay | Admitting: Obstetrics and Gynecology

## 2019-11-23 DIAGNOSIS — R928 Other abnormal and inconclusive findings on diagnostic imaging of breast: Secondary | ICD-10-CM

## 2019-12-03 ENCOUNTER — Other Ambulatory Visit: Payer: Federal, State, Local not specified - PPO

## 2019-12-07 ENCOUNTER — Ambulatory Visit: Payer: Federal, State, Local not specified - PPO

## 2019-12-07 ENCOUNTER — Ambulatory Visit
Admission: RE | Admit: 2019-12-07 | Discharge: 2019-12-07 | Disposition: A | Discharge status: Home / Self Care | Payer: Federal, State, Local not specified - PPO | Source: Ambulatory Visit | Attending: Obstetrics and Gynecology | Admitting: Obstetrics and Gynecology

## 2019-12-07 ENCOUNTER — Other Ambulatory Visit: Payer: Self-pay

## 2019-12-07 DIAGNOSIS — R928 Other abnormal and inconclusive findings on diagnostic imaging of breast: Secondary | ICD-10-CM

## 2019-12-07 DIAGNOSIS — R922 Inconclusive mammogram: Secondary | ICD-10-CM | POA: Diagnosis not present

## 2020-01-25 ENCOUNTER — Other Ambulatory Visit: Payer: Self-pay

## 2020-01-25 ENCOUNTER — Ambulatory Visit: Payer: Federal, State, Local not specified - PPO | Admitting: Internal Medicine

## 2020-01-25 ENCOUNTER — Encounter: Payer: Self-pay | Admitting: Internal Medicine

## 2020-01-25 DIAGNOSIS — I10 Essential (primary) hypertension: Secondary | ICD-10-CM

## 2020-01-25 DIAGNOSIS — E559 Vitamin D deficiency, unspecified: Secondary | ICD-10-CM | POA: Diagnosis not present

## 2020-01-25 DIAGNOSIS — G4700 Insomnia, unspecified: Secondary | ICD-10-CM | POA: Diagnosis not present

## 2020-01-25 DIAGNOSIS — K219 Gastro-esophageal reflux disease without esophagitis: Secondary | ICD-10-CM | POA: Diagnosis not present

## 2020-01-25 MED ORDER — LOSARTAN POTASSIUM 100 MG PO TABS: 100.0000 mg | ORAL_TABLET | Freq: Every day | ORAL | 3 refills | Status: DC

## 2020-01-25 MED ORDER — HYDROXYZINE HCL 25 MG PO TABS: 25.0000 mg | ORAL_TABLET | Freq: Every evening | ORAL | 5 refills | Status: DC | PRN

## 2020-01-25 MED ORDER — VITAMIN D3 50 MCG (2000 UT) PO CAPS: 2000.0000 [IU] | ORAL_CAPSULE | Freq: Every day | ORAL | 3 refills | Status: DC

## 2020-01-25 MED ORDER — HYDROCHLOROTHIAZIDE 25 MG PO TABS: 25.0000 mg | ORAL_TABLET | Freq: Every day | ORAL | 3 refills | Status: DC

## 2020-01-25 NOTE — Assessment & Plan Note (Signed)
Protonix d/c'd

## 2020-01-25 NOTE — Progress Notes (Signed)
Subjective:  Patient ID: Sydney [name], female    DOB: [birth year]  Age: 54 y.o. MRN: [id number]  CC: No chief complaint on file.   HPI Mayo Hornacek presents for HTN, GERD, insomnia  Outpatient Medications Prior to Visit  Medication Sig Dispense Refill   Cholecalciferol (VITAMIN D3) 50 MCG (2000 UT) capsule Take 1 capsule (2,000 Units total) by mouth daily. 100 capsule 3   cyclobenzaprine (FLEXERIL) 5 MG tablet Take 1 tablet (5 mg total) by mouth at bedtime. 30 tablet 1   eszopiclone (LUNESTA) 2 MG TABS tablet Take 0.5-1 tablets (1-2 mg total) by mouth at bedtime as needed for sleep. Take immediately before bedtime 30 tablet 3   ferrous sulfate 325 (65 FE) MG EC tablet Take 1 tablet (325 mg total) by mouth daily with breakfast. 90 tablet 3   hydrochlorothiazide (HYDRODIURIL) 25 MG tablet Take 1 tablet (25 mg total) by mouth daily. 90 tablet 3   loratadine (CLARITIN) 10 MG tablet Take 1 tablet (10 mg total) by mouth daily. 90 tablet 0   losartan (COZAAR) 100 MG tablet Take 1 tablet (100 mg total) by mouth daily. 90 tablet 3   meloxicam (MOBIC) 15 MG tablet Take 1 tablet (15 mg total) by mouth daily. 30 tablet 0   pantoprazole (PROTONIX) 40 MG tablet Take 1 tablet (40 mg total) by mouth daily. 30 tablet 3   saccharomyces boulardii (FLORASTOR) 250 MG capsule Take 1 capsule (250 mg total) by mouth 2 (two) times daily. 30 capsule 1   No facility-administered medications prior to visit.    ROS: Review of Systems  Constitutional: Negative for activity change, appetite change, chills, fatigue and unexpected weight change.  HENT: Negative for congestion, mouth sores and sinus pressure.   Eyes: Negative for visual disturbance.  Respiratory: Negative for cough and chest tightness.   Gastrointestinal: Negative for abdominal pain and nausea.  Genitourinary: Negative for difficulty urinating, frequency and vaginal pain.  Musculoskeletal: Negative for back pain and gait problem.  Skin:  Negative for pallor and rash.  Neurological: Negative for dizziness, tremors, weakness, numbness and headaches.  Psychiatric/Behavioral: Negative for confusion, sleep disturbance and suicidal ideas.    Objective:  BP 126/64 (BP Location: Right Arm, Patient Position: Sitting, Cuff Size: Large)   Pulse 77   Temp 99 F (37.2 C) (Oral)   Ht 5\' 2"  (1.575 m)   Wt 188 lb (85.3 kg)   LMP 05/04/2012   SpO2 96%   BMI 34.39 kg/m   BP Readings from Last 3 Encounters:  01/25/20 126/64  07/27/19 130/60  06/24/19 102/64    Wt Readings from Last 3 Encounters:  01/25/20 188 lb (85.3 kg)  07/27/19 174 lb (78.9 kg)  06/24/19 172 lb (78 kg)    Physical Exam Constitutional:      General: She is not in acute distress.    Appearance: She is well-developed. She is obese.  HENT:     Head: Normocephalic.     Right Ear: External ear normal.     Left Ear: External ear normal.     Nose: Nose normal.  Eyes:     General:        Right eye: No discharge.        Left eye: No discharge.     Conjunctiva/sclera: Conjunctivae normal.     Pupils: Pupils are equal, round, and reactive to light.  Neck:     Thyroid: No thyromegaly.     Vascular: No JVD.  Trachea: No tracheal deviation.  Cardiovascular:     Rate and Rhythm: Normal rate and regular rhythm.     Heart sounds: Normal heart sounds.  Pulmonary:     Effort: No respiratory distress.     Breath sounds: No stridor. No wheezing.  Abdominal:     General: Bowel sounds are normal. There is no distension.     Palpations: Abdomen is soft. There is no mass.     Tenderness: There is no abdominal tenderness. There is no guarding or rebound.  Musculoskeletal:        General: No tenderness.     Cervical back: Normal range of motion and neck supple.  Lymphadenopathy:     Cervical: No cervical adenopathy.  Skin:    Findings: No erythema or rash.  Neurological:     Cranial Nerves: No cranial nerve deficit.     Motor: No abnormal muscle tone.      Coordination: Coordination normal.     Deep Tendon Reflexes: Reflexes normal.  Psychiatric:        Behavior: Behavior normal.        Thought Content: Thought content normal.        Judgment: Judgment normal.     Lab Results  Component Value Date   WBC 4.6 11/02/2019   HGB 13.0 11/02/2019   HCT 38.9 11/02/2019   PLT 234.0 11/02/2019   GLUCOSE 81 11/02/2019   CHOL 180 11/02/2019   TRIG 203.0 (H) 11/02/2019   HDL 59.40 11/02/2019   LDLDIRECT 73.0 11/02/2019   LDLCALC 67 03/24/2018   ALT 20 11/02/2019   AST 16 11/02/2019   NA 139 11/02/2019   K 3.4 (L) 11/02/2019   CL 101 11/02/2019   CREATININE 0.87 11/02/2019   BUN 19 11/02/2019   CO2 32 11/02/2019   TSH 1.62 11/02/2019   HGBA1C 6.1 10/13/2010    MM DIAG BREAST TOMO UNI RIGHT  Result Date: 12/07/2019 CLINICAL DATA:  Patient recalled from screening for right breast asymmetry. EXAM: DIGITAL DIAGNOSTIC UNILATERAL RIGHT MAMMOGRAM WITH CAD AND TOMO COMPARISON:  Previous exam(s). ACR Breast Density Category d: The breast tissue is extremely dense, which lowers the sensitivity of mammography. FINDINGS: Questioned asymmetry within the slightly outer right breast resolved with additional imaging including spot compression CC and MLO tomosynthesis images. Mammographic images were processed with CAD. IMPRESSION: No mammographic evidence for malignancy. RECOMMENDATION: Screening mammogram in one year.(Code:SM-B-01Y) I have discussed the findings and recommendations with the patient. If applicable, a reminder letter will be sent to the patient regarding the next appointment. BI-RADS CATEGORY  1: Negative. Electronically Signed   By: Lovey Newcomer M.D.   On: 12/07/2019 15:54    Assessment & Plan:     Follow-up: No follow-ups on file.  Walker Kehr, MD

## 2020-01-25 NOTE — Assessment & Plan Note (Signed)
Try Benadryl 12.5, 25 or 50 mg at bedtime for insomnia Hydroxyzine is another option D/c Lunesta - did not help

## 2020-01-25 NOTE — Patient Instructions (Signed)
Try Benadryl 12.5, 25 or 50 mg at bedtime for insomnia

## 2020-01-25 NOTE — Assessment & Plan Note (Signed)
On Losartan and HCTZ 

## 2020-01-25 NOTE — Assessment & Plan Note (Signed)
Vit d 

## 2020-03-24 ENCOUNTER — Ambulatory Visit: Payer: Federal, State, Local not specified - PPO | Attending: Family

## 2020-03-24 DIAGNOSIS — Z23 Encounter for immunization: Secondary | ICD-10-CM

## 2020-03-24 NOTE — Progress Notes (Signed)
   Covid-19 Vaccination Clinic  Name:  Alithia Pera    MRN: [id number] DOB: September 10, 1966  03/24/2020  Ms. Soland was observed post Covid-19 immunization for 15 minutes without incident. She was provided with Vaccine Information Sheet and instruction to access the V-Safe system.   Ms. Zeiser was instructed to call 911 with any severe reactions post vaccine: Marland Kitchen Difficulty breathing  . Swelling of face and throat  . A fast heartbeat  . A bad rash all over body  . Dizziness and weakness   Immunizations Administered    Name Date Dose VIS Date Route   Moderna COVID-19 Vaccine 03/24/2020  4:40 PM 0.5 mL 11/2019 Intramuscular   Manufacturer: Moderna   Lot: [id number]   [id number]

## 2020-04-21 ENCOUNTER — Ambulatory Visit: Payer: Federal, State, Local not specified - PPO

## 2020-07-15 ENCOUNTER — Ambulatory Visit: Payer: Self-pay

## 2020-07-15 NOTE — Telephone Encounter (Signed)
°   [place] Female, 54 y.o., [birth year] MRN:  [id number] Phone:  [phone] Jerilynn Mages) PCP:  Cassandria Anger, MD Coverage:  Sherre Poot Blue [employer]/Federal Emp Ppo Next Appt With Internal Medicine 07/25/2020 at 3:00 PM Message from [facility] sent at 07/15/2020 2:54 PM EDT  Summary: covid testing questions, protocol questions    Patient would like to know if she should be tested for covid-19 due to exposure to positive individuals. Patient has questions about the testing process and if her nephew should be tested as well as he lives with her.          Call History   Type Contact Phone  07/15/2020 02:52 PM EDT Phone (Incoming) Janne, Faulk (Self) [phone] (H)  User: Sheran Luz

## 2020-07-15 NOTE — Telephone Encounter (Signed)
Please advise. See 8/13 encounter

## 2020-07-19 NOTE — Telephone Encounter (Signed)
Unable to reach pt and vm full, will try again

## 2020-07-25 ENCOUNTER — Ambulatory Visit: Payer: Federal, State, Local not specified - PPO | Admitting: Internal Medicine

## 2020-07-25 ENCOUNTER — Other Ambulatory Visit: Payer: Self-pay

## 2020-07-25 ENCOUNTER — Encounter: Payer: Self-pay | Admitting: Internal Medicine

## 2020-07-25 VITALS — BP 140/96 | HR 79 | Temp 99.0°F | Ht 62.0 in | Wt 187.2 lb

## 2020-07-25 DIAGNOSIS — R739 Hyperglycemia, unspecified: Secondary | ICD-10-CM | POA: Diagnosis not present

## 2020-07-25 DIAGNOSIS — R635 Abnormal weight gain: Secondary | ICD-10-CM

## 2020-07-25 DIAGNOSIS — E559 Vitamin D deficiency, unspecified: Secondary | ICD-10-CM

## 2020-07-25 DIAGNOSIS — G4700 Insomnia, unspecified: Secondary | ICD-10-CM

## 2020-07-25 DIAGNOSIS — I10 Essential (primary) hypertension: Secondary | ICD-10-CM

## 2020-07-25 MED ORDER — HYDROXYZINE HCL 50 MG PO TABS: 50.0000 mg | ORAL_TABLET | Freq: Every day | ORAL | 5 refills | Status: DC

## 2020-07-25 NOTE — Progress Notes (Signed)
Subjective:  Patient ID: Sydney [name], female    DOB: 08/21/1966  Age: 54 y.o. MRN: [id number]  CC: Follow-up and Insomnia   HPI Sydney [name] presents for HTN C/o Insomnia, wt gain  Outpatient Medications Prior to Visit  Medication Sig Dispense Refill  . Cholecalciferol (VITAMIN D3) 50 MCG (2000 UT) capsule Take 1 capsule (2,000 Units total) by mouth daily. 100 capsule 3  . ferrous sulfate 325 (65 FE) MG EC tablet Take 1 tablet (325 mg total) by mouth daily with breakfast. 90 tablet 3  . hydrochlorothiazide (HYDRODIURIL) 25 MG tablet Take 1 tablet (25 mg total) by mouth daily. 90 tablet 3  . losartan (COZAAR) 100 MG tablet Take 1 tablet (100 mg total) by mouth daily. 90 tablet 3  . cyclobenzaprine (FLEXERIL) 5 MG tablet Take 1 tablet (5 mg total) by mouth at bedtime. (Patient not taking: Reported on 07/25/2020) 30 tablet 1  . hydrOXYzine (ATARAX/VISTARIL) 25 MG tablet Take 1-2 tablets (25-50 mg total) by mouth at bedtime as needed (insomnia). (Patient not taking: Reported on 07/25/2020) 60 tablet 5  . loratadine (CLARITIN) 10 MG tablet Take 1 tablet (10 mg total) by mouth daily. (Patient not taking: Reported on 07/25/2020) 90 tablet 0  . meloxicam (MOBIC) 15 MG tablet Take 1 tablet (15 mg total) by mouth daily. (Patient not taking: Reported on 07/25/2020) 30 tablet 0  . saccharomyces boulardii (FLORASTOR) 250 MG capsule Take 1 capsule (250 mg total) by mouth 2 (two) times daily. (Patient not taking: Reported on 07/25/2020) 30 capsule 1   No facility-administered medications prior to visit.    ROS: Review of Systems  Constitutional: Negative for activity change, appetite change, chills, fatigue and unexpected weight change.  HENT: Negative for congestion, mouth sores and sinus pressure.   Eyes: Negative for visual disturbance.  Respiratory: Negative for cough and chest tightness.   Gastrointestinal: Negative for abdominal pain and nausea.  Genitourinary: Negative for difficulty  urinating, frequency and vaginal pain.  Musculoskeletal: Negative for back pain and gait problem.  Skin: Negative for pallor and rash.  Neurological: Negative for dizziness, tremors, weakness, numbness and headaches.  Psychiatric/Behavioral: Positive for sleep disturbance. Negative for confusion.    Objective:  BP (!) 140/96 (BP Location: Right Arm, Patient Position: Sitting, Cuff Size: Normal)   Pulse 79   Temp 99 F (37.2 C) (Oral)   Ht 5\' 2"  (1.575 m)   Wt 187 lb 3.2 oz (84.9 kg)   LMP 05/04/2012   SpO2 97%   BMI 34.24 kg/m   BP Readings from Last 3 Encounters:  07/25/20 (!) 140/96  01/25/20 126/64  07/27/19 130/60    Wt Readings from Last 3 Encounters:  07/25/20 187 lb 3.2 oz (84.9 kg)  01/25/20 188 lb (85.3 kg)  07/27/19 174 lb (78.9 kg)    Physical Exam Constitutional:      General: She is not in acute distress.    Appearance: She is well-developed.  HENT:     Head: Normocephalic.     Right Ear: External ear normal.     Left Ear: External ear normal.     Nose: Nose normal.  Eyes:     General:        Right eye: No discharge.        Left eye: No discharge.     Conjunctiva/sclera: Conjunctivae normal.     Pupils: Pupils are equal, round, and reactive to light.  Neck:     Thyroid: No thyromegaly.  Vascular: No JVD.     Trachea: No tracheal deviation.  Cardiovascular:     Rate and Rhythm: Normal rate and regular rhythm.     Heart sounds: Normal heart sounds.  Pulmonary:     Effort: No respiratory distress.     Breath sounds: No stridor. No wheezing.  Abdominal:     General: Bowel sounds are normal. There is no distension.     Palpations: Abdomen is soft. There is no mass.     Tenderness: There is no abdominal tenderness. There is no guarding or rebound.  Musculoskeletal:        General: No tenderness.     Cervical back: Normal range of motion and neck supple.  Lymphadenopathy:     Cervical: No cervical adenopathy.  Skin:    Findings: No erythema  or rash.  Neurological:     Cranial Nerves: No cranial nerve deficit.     Motor: No abnormal muscle tone.     Coordination: Coordination normal.     Deep Tendon Reflexes: Reflexes normal.  Psychiatric:        Behavior: Behavior normal.        Thought Content: Thought content normal.        Judgment: Judgment normal.     Lab Results  Component Value Date   WBC 4.6 11/02/2019   HGB 13.0 11/02/2019   HCT 38.9 11/02/2019   PLT 234.0 11/02/2019   GLUCOSE 81 11/02/2019   CHOL 180 11/02/2019   TRIG 203.0 (H) 11/02/2019   HDL 59.40 11/02/2019   LDLDIRECT 73.0 11/02/2019   LDLCALC 67 03/24/2018   ALT 20 11/02/2019   AST 16 11/02/2019   NA 139 11/02/2019   K 3.4 (L) 11/02/2019   CL 101 11/02/2019   CREATININE 0.87 11/02/2019   BUN 19 11/02/2019   CO2 32 11/02/2019   TSH 1.62 11/02/2019   HGBA1C 6.1 10/13/2010    MM DIAG BREAST TOMO UNI RIGHT  Result Date: 12/07/2019 CLINICAL DATA:  Patient recalled from screening for right breast asymmetry. EXAM: DIGITAL DIAGNOSTIC UNILATERAL RIGHT MAMMOGRAM WITH CAD AND TOMO COMPARISON:  Previous exam(s). ACR Breast Density Category d: The breast tissue is extremely dense, which lowers the sensitivity of mammography. FINDINGS: Questioned asymmetry within the slightly outer right breast resolved with additional imaging including spot compression CC and MLO tomosynthesis images. Mammographic images were processed with CAD. IMPRESSION: No mammographic evidence for malignancy. RECOMMENDATION: Screening mammogram in one year.(Code:SM-B-01Y) I have discussed the findings and recommendations with the patient. If applicable, a reminder letter will be sent to the patient regarding the next appointment. BI-RADS CATEGORY  1: Negative. Electronically Signed   By: Lovey Newcomer M.D.   On: 12/07/2019 15:54    Assessment & Plan:   There are no diagnoses linked to this encounter.   No orders of the defined types were placed in this encounter.    Follow-up: No  follow-ups on file.  Walker Kehr, MD

## 2020-07-25 NOTE — Assessment & Plan Note (Signed)
Vit D q 1 mo Risks associated with treatment noncompliance were discussed. Compliance was encouraged.

## 2020-07-25 NOTE — Patient Instructions (Addendum)
Liraglutide injection (Weight Management) What is this medicine? LIRAGLUTIDE (LIR a GLOO tide) is used to help people lose weight and maintain weight loss. It is used with a reduced-calorie diet and exercise. This medicine may be used for other purposes; ask your health care provider or pharmacist if you have questions. COMMON BRAND NAME(S): Saxenda What should I tell my health care provider before I take this medicine? They need to know if you have any of these conditions:  endocrine tumors (MEN 2) or if someone in your family had these tumors  gallbladder disease  high cholesterol  history of alcohol abuse problem  history of pancreatitis  kidney disease or if you are on dialysis  liver disease  previous swelling of the tongue, face, or lips with difficulty breathing, difficulty swallowing, hoarseness, or tightening of the throat  stomach problems  suicidal thoughts, plans, or attempt; a previous suicide attempt by you or a family member  thyroid cancer or if someone in your family had thyroid cancer  an unusual or allergic reaction to liraglutide, other medicines, foods, dyes, or preservatives  pregnant or trying to get pregnant  breast-feeding How should I use this medicine? This medicine is for injection under the skin of your upper leg, stomach area, or upper arm. You will be taught how to prepare and give this medicine. Use exactly as directed. Take your medicine at regular intervals. Do not take it more often than directed. This drug comes with INSTRUCTIONS FOR USE. Ask your pharmacist for directions on how to use this drug. Read the information carefully. Talk to your pharmacist or health care provider if you have questions. It is important that you put your used needles and syringes in a special sharps container. Do not put them in a trash can. If you do not have a sharps container, call your pharmacist or healthcare provider to get one. A special MedGuide will be  given to you by the pharmacist with each prescription and refill. Be sure to read this information carefully each time. Talk to your pediatrician regarding the use of this medicine in children. Special care may be needed. Overdosage: If you think you have taken too much of this medicine contact a poison control center or emergency room at once. NOTE: This medicine is only for you. Do not share this medicine with others. What if I miss a dose? If you miss a dose, take it as soon as you can. If it is almost time for your next dose, take only that dose. Do not take double or extra doses. If you miss your dose for 3 days or more, call your doctor or health care professional to talk about how to restart this medicine. What may interact with this medicine?  insulin and other medicines for diabetes This list may not describe all possible interactions. Give your health care provider a list of all the medicines, herbs, non-prescription drugs, or dietary supplements you use. Also tell them if you smoke, drink alcohol, or use illegal drugs. Some items may interact with your medicine. What should I watch for while using this medicine? Visit your doctor or health care professional for regular checks on your progress. Drink plenty of fluids while taking this medicine. Check with your doctor or health care professional if you get an attack of severe diarrhea, nausea, and vomiting. The loss of too much body fluid can make it dangerous for you to take this medicine. This medicine may affect blood sugar levels. Ask your healthcare  provider if changes in diet or medicines are needed if you have diabetes. Patients and their families should watch out for worsening depression or thoughts of suicide. Also watch out for sudden changes in feelings such as feeling anxious, agitated, panicky, irritable, hostile, aggressive, impulsive, severely restless, overly excited and hyperactive, or not being able to sleep. If this happens,  especially at the beginning of treatment or after a change in dose, call your health care professional. Women should inform their health care provider if they wish to become pregnant or think they might be pregnant. Losing weight while pregnant is not advised and may cause harm to the unborn child. Talk to your health care provider for more information. What side effects may I notice from receiving this medicine? Side effects that you should report to your doctor or health care professional as soon as possible:  allergic reactions like skin rash, itching or hives, swelling of the face, lips, or tongue  breathing problems  diarrhea that continues or is severe  lump or swelling on the neck  severe nausea  signs and symptoms of infection like fever or chills; cough; sore throat; pain or trouble passing urine  signs and symptoms of low blood sugar such as feeling anxious; confusion; dizziness; increased hunger; unusually weak or tired; increased sweating; shakiness; cold, clammy skin; irritable; headache; blurred vision; fast heartbeat; loss of consciousness  signs and symptoms of kidney injury like trouble passing urine or change in the amount of urine  trouble swallowing  unusual stomach upset or pain  vomiting Side effects that usually do not require medical attention (report to your doctor or health care professional if they continue or are bothersome):  constipation  decreased appetite  diarrhea  fatigue  headache  nausea  pain, redness, or irritation at site where injected  stomach upset  stuffy or runny nose This list may not describe all possible side effects. Call your doctor for medical advice about side effects. You may report side effects to FDA at 1-800-FDA-1088. Where should I keep my medicine? Keep out of the reach of children. Store unopened pen in a refrigerator between 2 and 8 degrees C (36 and 46 degrees F). Do not freeze or use if the medicine has been  frozen. Protect from light and excessive heat. After you first use the pen, it can be stored at room temperature between 15 and 30 degrees C (59 and 86 degrees F) or in a refrigerator. Throw away your used pen after 30 days or after the expiration date, whichever comes first. Do not store your pen with the needle attached. If the needle is left on, medicine may leak from the pen. NOTE: This sheet is a summary. It may not cover all possible information. If you have questions about this medicine, talk to your doctor, pharmacist, or health care provider.  2020 Elsevier/Gold Standard (2019-09-24 21:16:59)   ----------------------------------------------------------------------  These suggestions will probably help you to improve your metabolism if you are not overweight and to lose weight if you are overweight: 1.  Reduce your consumption of sugars and starches.  Eliminate high fructose corn syrup from your diet.  Reduce your consumption of processed foods.  For desserts try to have seasonal fruits, berries, nuts, cheeses or dark chocolate with more than 70% cacao. 2.  Do not snack 3.  You do not have to eat breakfast.  If you choose to have breakfast - eat plain greek yogurt, eggs, oatmeal (without sugar) - use honey if you  need to. 4.  Drink water, freshly brewed unsweetened tea (green, black or herbal) or coffee.  Do not drink sodas including diet sodas , juices, beverages sweetened with artificial sweeteners. 5.  Reduce your consumption of refined grains. 6.  Avoid protein drinks such as Optifast, Slim fast etc. Eat chicken, fish, meat, dairy and beans for your sources of protein. 7.  Natural unprocessed fats like cold pressed virgin olive oil, butter, coconut oil are good for you.  Eat avocados. 8.  Increase your consumption of fiber.  Fruits, berries, vegetables, whole grains, flaxseed, chia seeds, beans, popcorn, nuts, oatmeal are good sources of fiber 9.  Use vinegar in your diet, i.e. apple  cider vinegar, red wine or balsamic vinegar 10.  You can try fasting.  For example you can skip breakfast and lunch every other day (24-hour fast) 11.  Stress reduction, good night sleep, relaxation, meditation, yoga and other physical activity is likely to help you to maintain low weight too. 12.  If you drink alcohol, limit your alcohol intake to no more than 2 drinks a day.

## 2020-07-25 NOTE — Assessment & Plan Note (Signed)
On Losartan and HCTZ

## 2020-07-25 NOTE — Assessment & Plan Note (Signed)
Try Hydroxyzine

## 2020-07-26 ENCOUNTER — Ambulatory Visit: Payer: Federal, State, Local not specified - PPO | Admitting: Family Medicine

## 2020-07-26 ENCOUNTER — Encounter: Payer: Self-pay | Admitting: Family Medicine

## 2020-07-26 ENCOUNTER — Ambulatory Visit (INDEPENDENT_AMBULATORY_CARE_PROVIDER_SITE_OTHER): Payer: Federal, State, Local not specified - PPO

## 2020-07-26 VITALS — BP 112/84 | HR 82 | Ht 62.0 in | Wt 184.0 lb

## 2020-07-26 DIAGNOSIS — M999 Biomechanical lesion, unspecified: Secondary | ICD-10-CM | POA: Diagnosis not present

## 2020-07-26 DIAGNOSIS — M542 Cervicalgia: Secondary | ICD-10-CM

## 2020-07-26 DIAGNOSIS — M47812 Spondylosis without myelopathy or radiculopathy, cervical region: Secondary | ICD-10-CM | POA: Diagnosis not present

## 2020-07-26 MED ORDER — GABAPENTIN 100 MG PO CAPS: 200.0000 mg | ORAL_CAPSULE | Freq: Every day | ORAL | 3 refills | Status: DC

## 2020-07-26 NOTE — Patient Instructions (Signed)
Gabapentin 200 at night Exercise 3 times a wek See me again in 6-7 weeks

## 2020-07-26 NOTE — Progress Notes (Signed)
Sydney Sydney [name] Sydney Sydney [name] [facility] Phone: [phone] Subjective:   Sydney Sydney [name], am serving as a scribe for Dr. Hulan [name]. This visit occurred during the SARS-CoV-2 public health emergency.  Safety protocols were in place, including screening questions prior to the visit, additional usage of staff PPE, and extensive cleaning of exam room while observing appropriate contact time as indicated for disinfecting solutions.   I'm seeing this patient by the request  of:  Sydney Sydney [name], Sydney Lacks, MD  CC: Neck pain follow-up  [id number]   06/24/2019 Patient has made significant progress but still [name] the last 5 degrees of extension.  Patient now has full pronation and supination noted.  I would like 1 more x-ray to make sure there is Sydney [name] loose bodies that could be contributing to the loss of range of motion.  Patient can do physical therapy as needed at this point.  Follow-up as needed.  Update 07/26/2020 Sydney Sydney [name] is a 54 y.o. female coming in with complaint of neck pain. Patient states that she is having popping in right side of cervical spine with cervical flexion and extension. Sydney [name] pain. 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 Patient notes decreased feeling in 4th and 5th fingers. Notes fingers are cold at times.  Patient states that it seems to be worse with her working from home.  States that certain motions seem to make it more difficult with some popping.  Denies any true weakness of the upper extremities.  Sometimes very uncomfortable at night but not waking her up at night      Past Medical History:  Diagnosis Date  . Hypertension    Sydney [name] past surgical history on file. Social History   Socioeconomic History  . Marital status: Single    Spouse name: Not on file  . Number of children: Not on file  . Years of education: Not on file  . Highest education level: Not on file  Occupational History  . Not on file  Tobacco Use  .  Smoking status: Never Smoker  . Smokeless tobacco: Never Used  Substance and Sexual Activity  . Alcohol use: Sydney [name]  . Drug use: Sydney [name]  . Sexual activity: Not on file  Other Topics Concern  . Not on file  Social History Narrative  . Not on file   Social Determinants of Health   Financial Resource Strain:   . Difficulty of Paying Living Expenses: Not on file  Food Insecurity:   . Worried About Charity fundraiser in the Last Year: Not on file  . Ran Out of Food in the Last Year: Not on file  Transportation Needs:   . Lack of Transportation (Medical): Not on file  . Lack of Transportation (Non-Medical): Not on file  Physical Activity:   . Days of Exercise per Week: Not on file  . Minutes of Exercise per Session: Not on file  Stress:   . Feeling of Stress : Not on file  Social Connections:   . Frequency of Communication with Friends and Family: Not on file  . Frequency of Social Gatherings with Friends and Family: Not on file  . Attends Religious Services: Not on file  . Active Member of Clubs or Organizations: Not on file  . Attends Archivist Meetings: Not on file  . Marital Status: Not on file   Allergies  Allergen Reactions  . Shellfish Allergy Hives    Facial urticaria  . Enalapril Maleate  REACTION: cough   Sydney [name] family history on file.   Current Outpatient Medications (Cardiovascular):  .  hydrochlorothiazide (HYDRODIURIL) 25 MG tablet, Take 1 tablet (25 mg total) by mouth daily. Marland Kitchen  losartan (COZAAR) 100 MG tablet, Take 1 tablet (100 mg total) by mouth daily.  Current Outpatient Medications (Respiratory):  .  loratadine (CLARITIN) 10 MG tablet, Take 1 tablet (10 mg total) by mouth daily. (Patient not taking: Reported on 07/25/2020)  Current Outpatient Medications (Analgesics):  .  meloxicam (MOBIC) 15 MG tablet, Take 1 tablet (15 mg total) by mouth daily. (Patient not taking: Reported on 07/25/2020)  Current Outpatient Medications (Hematological):  .   ferrous sulfate 325 (65 FE) MG EC tablet, Take 1 tablet (325 mg total) by mouth daily with breakfast.  Current Outpatient Medications (Other):  Marland Kitchen  Cholecalciferol (VITAMIN D3) 50 MCG (2000 UT) capsule, Take 1 capsule (2,000 Units total) by mouth daily. .  cyclobenzaprine (FLEXERIL) 5 MG tablet, Take 1 tablet (5 mg total) by mouth at bedtime. (Patient not taking: Reported on 07/25/2020) .  hydrOXYzine (ATARAX/VISTARIL) 50 MG tablet, Take 1-2 tablets (50-100 mg total) by mouth at bedtime. .  saccharomyces boulardii (FLORASTOR) 250 MG capsule, Take 1 capsule (250 mg total) by mouth 2 (two) times daily. (Patient not taking: Reported on 07/25/2020)   Reviewed prior external information including notes and imaging from  primary care provider As well as notes that were available from care everywhere and other healthcare systems.  Past medical history, social, surgical and family history all reviewed in electronic medical record.  Sydney [name] pertanent information unless stated regarding to the chief complaint.   Review of Systems:  Sydney [name] headache, visual changes, nausea, vomiting, diarrhea, constipation, dizziness, abdominal pain, skin rash, fevers, chills, night sweats, weight loss, swollen lymph nodes, body aches, joint swelling, chest pain, shortness of breath, mood changes. POSITIVE muscle aches  Objective  Last menstrual period 05/04/2012.   General: Sydney [name] apparent distress alert and oriented x3 mood and affect normal, dressed appropriately.  HEENT: Pupils equal, extraocular movements intact  Respiratory: Patient's speak in full sentences and does not appear short of breath  Cardiovascular: Sydney [name] lower extremity edema, non tender, Sydney [name] erythema  Neuro: Cranial nerves II through XII are intact, neurovascularly intact in all extremities with 2+ DTRs and 2+ pulses.  Gait normal with good balance and coordination.  MSK:  Non tender with full range of motion and good stability and symmetric strength and tone of  shoulders, elbows, wrist, hip, knee and ankles bilaterally.  Neck exam does have some very mild loss of lordosis.  Some very minimal crepitus noted with range of motion and near full range of motion.  Tightness in the parascapular region right greater than left.  Negative Spurling's  Osteopathic findings  C2 flexed rotated and side bent rightt C6 flexed rotated and side bent left T3 extended rotated and side bent right inhaled third rib T7 extended rotated and side bent left     Impression and Recommendations:     The above documentation has been reviewed and is accurate and complete Lyndal Pulley, DO       Note: This dictation was prepared with Dragon dictation along with smaller phrase technology. Any transcriptional errors that result from this process are unintentional.

## 2020-07-26 NOTE — Assessment & Plan Note (Signed)
   Decision today to treat with OMT was based on Physical Exam  After verbal consent patient was treated with HVLA, ME, FPR techniques in cervical, thoracic, rib,areas, all areas are chronic   Patient tolerated the procedure well with improvement in symptoms  Patient given exercises, stretches and lifestyle modifications  See medications in patient instructions if given  Patient will follow up in 4-8 weeks 

## 2020-07-26 NOTE — Assessment & Plan Note (Signed)
Multifactorial but I do think it is more ergonomic related.  Patient does have a adjustable standing desk that she is to have at work that seem to be more beneficial and encouraged her to consider getting it into for her house.  Patient is in the process of moving and would like to wait until the moving is done.

## 2020-08-02 NOTE — Assessment & Plan Note (Signed)
Saxenda info

## 2020-09-07 ENCOUNTER — Ambulatory Visit: Payer: Federal, State, Local not specified - PPO | Admitting: Family Medicine

## 2020-09-21 ENCOUNTER — Ambulatory Visit: Payer: Federal, State, Local not specified - PPO | Admitting: Family Medicine

## 2020-11-07 ENCOUNTER — Ambulatory Visit: Payer: Federal, State, Local not specified - PPO | Admitting: Family Medicine

## 2020-11-08 NOTE — Progress Notes (Signed)
[facility] [address] [place] Phone: [phone] Subjective:   I Sydney [name] am serving as a Education administrator for Dr. Hulan [name].  This visit occurred during the SARS-CoV-2 public health emergency.  Safety protocols were in place, including screening questions prior to the visit, additional usage of staff PPE, and extensive cleaning of exam room while observing appropriate contact time as indicated for disinfecting solutions.   I'm seeing this patient by the request  of:  Sydney [name], Sydney Lacks, MD  CC: Back and neck pain follow-up  [id number]  Sydney [name] is a 54 y.o. female coming in with complaint of back and neck pain. OMT 07/26/2020. Patient states her neck is still popping. Back is doing better mainly neck pain today.  Patient states that it is more tightness.  Does have an audible popping sound from time to time.  Nothing that stops her from activity.  Just frustrating.  Does work from home and now she does not have the best posture.         Reviewed prior external information including notes and imaging from previsou exam, outside providers and external EMR if available.   As well as notes that were available from care everywhere and other healthcare systems.  Past medical history, social, surgical and family history all reviewed in electronic medical record.  No pertanent information unless stated regarding to the chief complaint.   Past Medical History:  Diagnosis Date  . Hypertension     Allergies  Allergen Reactions  . Shellfish Allergy Hives    Facial urticaria  . Enalapril Maleate     REACTION: cough     Review of Systems:  No headache, visual changes, nausea, vomiting, diarrhea, constipation, dizziness, abdominal pain, skin rash, fevers, chills, night sweats, weight loss, swollen lymph nodes, body aches, joint swelling, chest pain, shortness of breath, mood changes. POSITIVE muscle aches  Objective  Blood  pressure 122/90, pulse 70, height 5\' 2"  (1.575 m), weight 191 lb (86.6 kg), last menstrual period 05/04/2012, SpO2 95 %.   General: No apparent distress alert and oriented x3 mood and affect normal, dressed appropriately.  HEENT: Pupils equal, extraocular movements intact  Respiratory: Patient's speak in full sentences and does not appear short of breath  Cardiovascular: No lower extremity edema, non tender, no erythema  Neuro: Cranial nerves II through XII are intact, neurovascularly intact in all extremities with 2+ DTRs and 2+ pulses.  Gait normal with good balance and coordination.  MSK:  Non tender with full range of motion and good stability and symmetric strength and tone of shoulders, elbows, wrist, hip, knee and ankles bilaterally.  Neck exam does have some mild loss of lordosis.  Some tightness noted in the paraspinal musculature.  Patient has some limited sidebending right greater than left.  Osteopathic findings  C2 flexed rotated and side bent right T3 extended rotated and side bent right inhaled rib T5 extended rotated and side bent left L1 flexed rotated and side bent right Sacrum right on right       Assessment and Plan: NECK PAIN Known mild degenerative disc disease.  Patient is responding well to a osteopathic manipulation.  We have given patient meloxicam and gabapentin and she is not taking anything at this time.  We discussed posture and ergonomics that can work with her at home.  Is a chronic problem with mild exacerbation.  Discussed we could refill those medications if necessary.  Patient wants to try to do  this as natural as possible and will follow up with me again in 6 to 12 weeks worsening pain will consider the possibility of advanced imaging     Nonallopathic problems  Decision today to treat with OMT was based on Physical Exam  After verbal consent patient was treated with HVLA, ME, FPR techniques in cervical, rib, thoracic, lumbar, and sacral   areas  Patient tolerated the procedure well with improvement in symptoms  Patient given exercises, stretches and lifestyle modifications  See medications in patient instructions if given  Patient will follow up in 4-8 weeks      The above documentation has been reviewed and is accurate and complete Lyndal Pulley, DO       Note: This dictation was prepared with Dragon dictation along with smaller phrase technology. Any transcriptional errors that result from this process are unintentional.

## 2020-11-09 ENCOUNTER — Other Ambulatory Visit: Payer: Self-pay

## 2020-11-09 ENCOUNTER — Encounter: Payer: Self-pay | Admitting: Family Medicine

## 2020-11-09 ENCOUNTER — Ambulatory Visit: Payer: Federal, State, Local not specified - PPO | Admitting: Family Medicine

## 2020-11-09 VITALS — BP 122/90 | HR 70 | Ht 62.0 in | Wt 191.0 lb

## 2020-11-09 DIAGNOSIS — M542 Cervicalgia: Secondary | ICD-10-CM | POA: Diagnosis not present

## 2020-11-09 DIAGNOSIS — M999 Biomechanical lesion, unspecified: Secondary | ICD-10-CM

## 2020-11-09 NOTE — Assessment & Plan Note (Signed)
Known mild degenerative disc disease.  Patient is responding well to a osteopathic manipulation.  We have given patient meloxicam and gabapentin and she is not taking anything at this time.  We discussed posture and ergonomics that can work with her at home.  Is a chronic problem with mild exacerbation.  Discussed we could refill those medications if necessary.  Patient wants to try to do this as natural as possible and will follow up with me again in 6 to 12 weeks worsening pain will consider the possibility of advanced imaging

## 2020-11-09 NOTE — Patient Instructions (Addendum)
Good to see you Yoga wheel or percussion gun Keep doing exercises Monitor at eye level See me again in 6-12 weeks

## 2020-12-16 ENCOUNTER — Telehealth: Payer: Self-pay | Admitting: Internal Medicine

## 2020-12-16 NOTE — Telephone Encounter (Signed)
[place], [place] - [address] Phone:  [phone]  Fax:  [phone]     Patient was last seen in august, and talked about a weight loss medicine and you wrote her a paper prescription for the medication and she lost it so she is wondering if we could send it into the pharmacy for her.

## 2020-12-20 ENCOUNTER — Other Ambulatory Visit: Payer: Self-pay

## 2020-12-20 NOTE — Telephone Encounter (Signed)
Pt will need appt to discuss weight loss med. Pt has made appt for 12/21/20.Marland KitchenJohny Chess

## 2020-12-21 ENCOUNTER — Ambulatory Visit: Payer: Federal, State, Local not specified - PPO | Admitting: Internal Medicine

## 2020-12-21 ENCOUNTER — Encounter: Payer: Self-pay | Admitting: Internal Medicine

## 2020-12-21 ENCOUNTER — Other Ambulatory Visit: Payer: Self-pay

## 2020-12-21 DIAGNOSIS — R635 Abnormal weight gain: Secondary | ICD-10-CM | POA: Diagnosis not present

## 2020-12-21 DIAGNOSIS — E559 Vitamin D deficiency, unspecified: Secondary | ICD-10-CM

## 2020-12-21 DIAGNOSIS — I10 Essential (primary) hypertension: Secondary | ICD-10-CM

## 2020-12-21 DIAGNOSIS — Z6835 Body mass index (BMI) 35.0-35.9, adult: Secondary | ICD-10-CM

## 2020-12-21 DIAGNOSIS — E669 Obesity, unspecified: Secondary | ICD-10-CM | POA: Insufficient documentation

## 2020-12-21 MED ORDER — SAXENDA 18 MG/3ML ~~LOC~~ SOPN: 3.0000 mg | PEN_INJECTOR | Freq: Every day | SUBCUTANEOUS | 5 refills | Status: DC

## 2020-12-21 MED ORDER — PHENTERMINE HCL 37.5 MG PO TABS: 37.5000 mg | ORAL_TABLET | Freq: Every day | ORAL | 2 refills | Status: DC

## 2020-12-21 NOTE — Assessment & Plan Note (Signed)
Saxenda Rx Phentermine po  Potential benefits of a long term stimulant  use as well as potential risks  and complications were explained to the patient and were aknowledged.

## 2020-12-21 NOTE — Assessment & Plan Note (Signed)
Continue with vitamin D 

## 2020-12-21 NOTE — Assessment & Plan Note (Addendum)
Continue with losartan and HCT BP - nl Obtain c-Met

## 2020-12-21 NOTE — Assessment & Plan Note (Addendum)
BMI is 35.  Saxenda information provided.  We can use it if covered by her insurance. Otherwise will try to use phentermine 37.5 mg one half or 1 morning.  She will continue with a walking program Obtain TSH, glucose and other tests

## 2020-12-21 NOTE — Progress Notes (Signed)
Subjective:  Patient ID: Sydney [name], female    DOB: 08-18-1966  Age: 55 y.o. MRN: [id number]  CC: Follow-up (4 month f/u)   HPI Sydney [name] presents for HTN, wt gain, obesity f/u  Outpatient Medications Prior to Visit  Medication Sig Dispense Refill  . hydrochlorothiazide (HYDRODIURIL) 25 MG tablet Take 1 tablet (25 mg total) by mouth daily. 90 tablet 3  . losartan (COZAAR) 100 MG tablet Take 1 tablet (100 mg total) by mouth daily. 90 tablet 3  . Cholecalciferol (VITAMIN D3) 50 MCG (2000 UT) capsule Take 1 capsule (2,000 Units total) by mouth daily. (Patient not taking: Reported on 12/21/2020) 100 capsule 3  . cyclobenzaprine (FLEXERIL) 5 MG tablet Take 1 tablet (5 mg total) by mouth at bedtime. (Patient not taking: Reported on 12/21/2020) 30 tablet 1  . ferrous sulfate 325 (65 FE) MG EC tablet Take 1 tablet (325 mg total) by mouth daily with breakfast. (Patient not taking: Reported on 12/21/2020) 90 tablet 3  . gabapentin (NEURONTIN) 100 MG capsule Take 2 capsules (200 mg total) by mouth at bedtime. (Patient not taking: Reported on 12/21/2020) 180 capsule 3  . hydrOXYzine (ATARAX/VISTARIL) 50 MG tablet Take 1-2 tablets (50-100 mg total) by mouth at bedtime. (Patient not taking: Reported on 12/21/2020) 60 tablet 5  . loratadine (CLARITIN) 10 MG tablet Take 1 tablet (10 mg total) by mouth daily. (Patient not taking: Reported on 12/21/2020) 90 tablet 0  . meloxicam (MOBIC) 15 MG tablet Take 1 tablet (15 mg total) by mouth daily. (Patient not taking: Reported on 12/21/2020) 30 tablet 0  . saccharomyces boulardii (FLORASTOR) 250 MG capsule Take 1 capsule (250 mg total) by mouth 2 (two) times daily. (Patient not taking: Reported on 12/21/2020) 30 capsule 1   No facility-administered medications prior to visit.    ROS: Review of Systems  Constitutional: Positive for unexpected weight change. Negative for activity change, appetite change, chills and fatigue.  HENT: Negative for congestion,  mouth sores and sinus pressure.   Eyes: Negative for visual disturbance.  Respiratory: Negative for cough and chest tightness.   Gastrointestinal: Negative for abdominal pain and nausea.  Genitourinary: Negative for difficulty urinating, frequency and vaginal pain.  Musculoskeletal: Negative for back pain and gait problem.  Skin: Negative for pallor and rash.  Neurological: Negative for dizziness, tremors, weakness, numbness and headaches.  Psychiatric/Behavioral: Negative for confusion and sleep disturbance.    Objective:  BP 130/74 (BP Location: Left Arm)   Pulse 64   Temp 98 F (36.7 C) (Oral)   Wt 192 lb 9.6 oz (87.4 kg)   LMP 05/04/2012   SpO2 98%   BMI 35.23 kg/m   BP Readings from Last 3 Encounters:  12/21/20 130/74  11/09/20 122/90  07/26/20 112/84    Wt Readings from Last 3 Encounters:  12/21/20 192 lb 9.6 oz (87.4 kg)  11/09/20 191 lb (86.6 kg)  07/26/20 184 lb (83.5 kg)    Physical Exam Constitutional:      General: She is not in acute distress.    Appearance: She is well-developed. She is obese.  HENT:     Head: Normocephalic.     Right Ear: External ear normal.     Left Ear: External ear normal.     Nose: Nose normal.     Mouth/Throat:     Mouth: Oropharynx is clear and moist.  Eyes:     General:        Right eye: No discharge.  Left eye: No discharge.     Conjunctiva/sclera: Conjunctivae normal.     Pupils: Pupils are equal, round, and reactive to light.  Neck:     Thyroid: No thyromegaly.     Vascular: No JVD.     Trachea: No tracheal deviation.  Cardiovascular:     Rate and Rhythm: Normal rate and regular rhythm.     Heart sounds: Normal heart sounds.  Pulmonary:     Effort: No respiratory distress.     Breath sounds: No stridor. No wheezing.  Abdominal:     General: Bowel sounds are normal. There is no distension.     Palpations: Abdomen is soft. There is no mass.     Tenderness: There is no abdominal tenderness. There is no  guarding or rebound.  Musculoskeletal:        General: No tenderness or edema.     Cervical back: Normal range of motion and neck supple.  Lymphadenopathy:     Cervical: No cervical adenopathy.  Skin:    Findings: No erythema or rash.  Neurological:     Cranial Nerves: No cranial nerve deficit.     Motor: No abnormal muscle tone.     Coordination: Coordination normal.     Deep Tendon Reflexes: Reflexes normal.  Psychiatric:        Mood and Affect: Mood and affect normal.        Behavior: Behavior normal.        Thought Content: Thought content normal.        Judgment: Judgment normal.     Lab Results  Component Value Date   WBC 4.6 11/02/2019   HGB 13.0 11/02/2019   HCT 38.9 11/02/2019   PLT 234.0 11/02/2019   GLUCOSE 81 11/02/2019   CHOL 180 11/02/2019   TRIG 203.0 (H) 11/02/2019   HDL 59.40 11/02/2019   LDLDIRECT 73.0 11/02/2019   LDLCALC 67 03/24/2018   ALT 20 11/02/2019   AST 16 11/02/2019   NA 139 11/02/2019   K 3.4 (L) 11/02/2019   CL 101 11/02/2019   CREATININE 0.87 11/02/2019   BUN 19 11/02/2019   CO2 32 11/02/2019   TSH 1.62 11/02/2019   HGBA1C 6.1 10/13/2010    MM DIAG BREAST TOMO UNI RIGHT  Result Date: 12/07/2019 CLINICAL DATA:  Patient recalled from screening for right breast asymmetry. EXAM: DIGITAL DIAGNOSTIC UNILATERAL RIGHT MAMMOGRAM WITH CAD AND TOMO COMPARISON:  Previous exam(s). ACR Breast Density Category d: The breast tissue is extremely dense, which lowers the sensitivity of mammography. FINDINGS: Questioned asymmetry within the slightly outer right breast resolved with additional imaging including spot compression CC and MLO tomosynthesis images. Mammographic images were processed with CAD. IMPRESSION: No mammographic evidence for malignancy. RECOMMENDATION: Screening mammogram in one year.(Code:SM-B-01Y) I have discussed the findings and recommendations with the patient. If applicable, a reminder letter will be sent to the patient regarding the  next appointment. BI-RADS CATEGORY  1: Negative. Electronically Signed   By: Lovey Newcomer M.D.   On: 12/07/2019 15:54    Assessment & Plan:   There are no diagnoses linked to this encounter.   No orders of the defined types were placed in this encounter.    Follow-up: No follow-ups on file.  Walker Kehr, MD

## 2021-01-04 NOTE — Progress Notes (Signed)
Virtual Visit via Video Note  I connected with Sydney [name] on 01/05/21 at  9:45 AM EST by a video enabled telemedicine application and verified that I am speaking with the correct person using two identifiers.   I discussed the limitations of evaluation and management by telemedicine and the availability of in person appointments. The patient expressed understanding and agreed to proceed.  Present for the visit:  Myself, Dr Billey Gosling, Sydney [name].  The patient is currently at home and I am in the office.    No referring provider.    History of Present Illness: This is an acute visit for cough.   Her symptoms started Sunday, 4 days ago.  She started coughing.  She can not stop and almost gags and almost throws up.  She is hoarse.  On monday and Tuesday the cough was bad.  It is a little better now.    She wants to prevent coughing at all - it hurts her chest wall and hurts her throat.  Her stomach felt funny Friday , Saturday and Sunday.  That has resolved.  She had chills for a couple of days and has some mild congestion.  She denies any sore throat, just a dry throat.  She has mild postnasal drip.    Review of Systems  Constitutional: Positive for chills (x 2 days). Negative for fever.  HENT: Positive for congestion (mild). Negative for ear pain, sinus pain and sore throat (dry throat).        Mild PND  Respiratory: Positive for cough. Negative for shortness of breath and wheezing.   Gastrointestinal: Positive for abdominal pain, diarrhea (friday , saturday) and nausea.  Musculoskeletal: Negative for myalgias.  Neurological: Positive for headaches (one day). Negative for dizziness.      Social History   Socioeconomic History  . Marital status: Single    Spouse name: Not on file  . Number of children: Not on file  . Years of education: Not on file  . Highest education level: Not on file  Occupational History  . Not on file  Tobacco Use  . Smoking status: Never  Smoker  . Smokeless tobacco: Never Used  Substance and Sexual Activity  . Alcohol use: No  . Drug use: No  . Sexual activity: Not on file  Other Topics Concern  . Not on file  Social History Narrative  . Not on file   Social Determinants of Health   Financial Resource Strain: Not on file  Food Insecurity: Not on file  Transportation Needs: Not on file  Physical Activity: Not on file  Stress: Not on file  Social Connections: Not on file     Observations/Objective: Appears well in NAD Breathing normally Intermittent dry cough Skin appears warm and dry  Assessment and Plan:  Viral URI: Acute Symptoms consistent with viral illness.  Denies sick contacts.  Discussed possibilities including Covid, flu and several other viruses going around at this time No need for an antibiotic or prescriptions She will take over-the-counter cold medications and cough syrup Discussed medications okay to take with her high blood pressure Rest, fluids Call if no improvement   Follow Up Instructions:    I discussed the assessment and treatment plan with the patient. The patient was provided an opportunity to ask questions and all were answered. The patient agreed with the plan and demonstrated an understanding of the instructions.   The patient was advised to call back or seek an in-person evaluation if the symptoms  worsen or if the condition fails to improve as anticipated.    Binnie Rail, MD

## 2021-01-05 ENCOUNTER — Encounter: Payer: Self-pay | Admitting: Internal Medicine

## 2021-01-05 ENCOUNTER — Telehealth (INDEPENDENT_AMBULATORY_CARE_PROVIDER_SITE_OTHER): Payer: Federal, State, Local not specified - PPO | Admitting: Internal Medicine

## 2021-01-05 DIAGNOSIS — J069 Acute upper respiratory infection, unspecified: Secondary | ICD-10-CM

## 2021-01-20 ENCOUNTER — Telehealth: Payer: Self-pay | Admitting: Internal Medicine

## 2021-01-20 NOTE — Telephone Encounter (Signed)
Patient requesting letter to provide her employer She is requesting to be out of work from 2/22-3/1 to handle arrangements;death of her brother

## 2021-01-23 NOTE — Telephone Encounter (Signed)
Patient called and was wanting to know the status of her letter. She is requesting a call back at [phone]

## 2021-01-24 NOTE — Telephone Encounter (Signed)
Letter generated..tried calling pt to inform her letter is ready for pick-up, but the mail box is full. Will send msg via mychart letting her know letter is ready for pick-up.Marland KitchenJohny [name]

## 2021-01-24 NOTE — Telephone Encounter (Signed)
Lorre Nick, can you write a letter for Sydney [name]?  I am sorry about her brother's death. Thanks, AP

## 2021-01-26 ENCOUNTER — Other Ambulatory Visit (INDEPENDENT_AMBULATORY_CARE_PROVIDER_SITE_OTHER): Payer: Federal, State, Local not specified - PPO

## 2021-01-26 DIAGNOSIS — I10 Essential (primary) hypertension: Secondary | ICD-10-CM

## 2021-01-26 DIAGNOSIS — R739 Hyperglycemia, unspecified: Secondary | ICD-10-CM | POA: Diagnosis not present

## 2021-01-26 DIAGNOSIS — E559 Vitamin D deficiency, unspecified: Secondary | ICD-10-CM | POA: Diagnosis not present

## 2021-01-26 LAB — CBC WITH DIFFERENTIAL/PLATELET
Basophils Absolute: 0 10*3/uL (ref 0.0–0.1)
Basophils Relative: 0.6 % (ref 0.0–3.0)
Eosinophils Absolute: 0.1 10*3/uL (ref 0.0–0.7)
Eosinophils Relative: 1.1 % (ref 0.0–5.0)
HCT: 39.1 % (ref 36.0–46.0)
Hemoglobin: 13.1 g/dL (ref 12.0–15.0)
Lymphocytes Relative: 37.5 % (ref 12.0–46.0)
Lymphs Abs: 1.8 10*3/uL (ref 0.7–4.0)
MCHC: 33.6 g/dL (ref 30.0–36.0)
MCV: 83.7 fl (ref 78.0–100.0)
Monocytes Absolute: 0.5 10*3/uL (ref 0.1–1.0)
Monocytes Relative: 11 % (ref 3.0–12.0)
Neutro Abs: 2.4 10*3/uL (ref 1.4–7.7)
Neutrophils Relative %: 49.8 % (ref 43.0–77.0)
Platelets: 255 10*3/uL (ref 150.0–400.0)
RBC: 4.67 Mil/uL (ref 3.87–5.11)
RDW: 14 % (ref 11.5–15.5)
WBC: 4.8 10*3/uL (ref 4.0–10.5)

## 2021-01-26 NOTE — Addendum Note (Signed)
Addended by: Jacob Moores on: 01/26/2021 05:09 PM   Modules accepted: Orders

## 2021-01-27 ENCOUNTER — Other Ambulatory Visit: Payer: Self-pay | Admitting: Internal Medicine

## 2021-01-27 LAB — COMPREHENSIVE METABOLIC PANEL
ALT: 16 U/L (ref 0–35)
AST: 15 U/L (ref 0–37)
Albumin: 4.2 g/dL (ref 3.5–5.2)
Alkaline Phosphatase: 71 U/L (ref 39–117)
BUN: 24 mg/dL — ABNORMAL HIGH (ref 6–23)
CO2: 27 mEq/L (ref 19–32)
Calcium: 9.3 mg/dL (ref 8.4–10.5)
Chloride: 105 mEq/L (ref 96–112)
Creatinine, Ser: 0.94 mg/dL (ref 0.40–1.20)
GFR: 69.71 mL/min (ref >60.00)
Glucose, Bld: 87 mg/dL (ref 70–99)
Potassium: 3.8 mEq/L (ref 3.5–5.1)
Sodium: 141 mEq/L (ref 135–145)
Total Bilirubin: 0.3 mg/dL (ref 0.2–1.2)
Total Protein: 7.3 g/dL (ref 6.0–8.3)

## 2021-01-27 LAB — LDL CHOLESTEROL, DIRECT: Direct LDL: 97 mg/dL

## 2021-01-27 LAB — HEMOGLOBIN A1C: Hgb A1c MFr Bld: 6 % (ref 4.6–6.5)

## 2021-01-27 LAB — URINALYSIS
Bilirubin Urine: NEGATIVE
Hgb urine dipstick: NEGATIVE
Leukocytes,Ua: NEGATIVE
Nitrite: NEGATIVE
Specific Gravity, Urine: >=1.03 — AB (ref 1.000–1.030)
Total Protein, Urine: NEGATIVE
Urine Glucose: NEGATIVE
Urobilinogen, UA: 0.2 (ref 0.0–1.0)
pH: 6 (ref 5.0–8.0)

## 2021-01-27 LAB — VITAMIN D 25 HYDROXY (VIT D DEFICIENCY, FRACTURES): VITD: 17.12 ng/mL — ABNORMAL LOW (ref 30.00–100.00)

## 2021-01-27 LAB — LIPID PANEL
Cholesterol: 186 mg/dL (ref 0–200)
HDL: 61.8 mg/dL (ref >39.00)
NonHDL: 124.03
Total CHOL/HDL Ratio: 3
Triglycerides: 211 mg/dL — ABNORMAL HIGH (ref 0.0–149.0)
VLDL: 42.2 mg/dL — ABNORMAL HIGH (ref 0.0–40.0)

## 2021-01-27 LAB — HEPATIC FUNCTION PANEL
ALT: 16 U/L (ref 0–35)
AST: 15 U/L (ref 0–37)
Albumin: 4.2 g/dL (ref 3.5–5.2)
Alkaline Phosphatase: 71 U/L (ref 39–117)
Bilirubin, Direct: 0 mg/dL (ref 0.0–0.3)
Total Bilirubin: 0.3 mg/dL (ref 0.2–1.2)
Total Protein: 7.3 g/dL (ref 6.0–8.3)

## 2021-01-27 LAB — TSH: TSH: 1.64 u[IU]/mL (ref 0.35–4.50)

## 2021-01-27 MED ORDER — VITAMIN D3 1.25 MG (50000 UT) PO CAPS: 1.0000 | ORAL_CAPSULE | ORAL | 0 refills | Status: DC

## 2021-01-27 MED ORDER — VITAMIN D3 50 MCG (2000 UT) PO CAPS: 2000.0000 [IU] | ORAL_CAPSULE | Freq: Every day | ORAL | 3 refills | Status: DC

## 2021-01-31 NOTE — Progress Notes (Signed)
[facility] [place] Phone: [phone] Subjective:   Sydney [name], am serving as a scribe for Dr. Hulan Saas. This visit occurred during the SARS-CoV-2 public health emergency.  Safety protocols were in place, including screening questions prior to the visit, additional usage of staff PPE, and extensive cleaning of exam room while observing appropriate contact time as indicated for disinfecting solutions.   I'm seeing this patient by the request  of:  [name], Sydney Lacks, MD  CC: Back and neck pain follow-up  [id number]  Sydney [name] is a 55 y.o. female coming in with complaint of back and neck pain. OMT 11/09/2020. Patient states that she had 2 pops in last week but has not had this symptom for a while. [name] pain with popping. She was traveling for her brother's funeral. Patient feels like her pillow isn't the best, works at home on laptop, and has been sleeping in recliner which is contributing to her pain.   Medications patient has been prescribed: None          Reviewed prior external information including notes and imaging from previsou exam, outside providers and external EMR if available.   As well as notes that were available from care everywhere and other healthcare systems.  Past medical history, social, surgical and family history all reviewed in electronic medical record.  [name] pertanent information unless stated regarding to the chief complaint.   Past Medical History:  Diagnosis Date  . Hypertension     Allergies  Allergen Reactions  . Shellfish Allergy Hives    Facial urticaria  . Enalapril Maleate     REACTION: cough     Review of Systems:  [name] headache, visual changes, nausea, vomiting, diarrhea, constipation, dizziness, abdominal pain, skin rash, fevers, chills, night sweats, weight loss, swollen lymph nodes, body aches, joint swelling, chest pain, shortness of breath, mood changes.  POSITIVE muscle aches  Objective  Blood pressure 132/80, pulse 79, height 5\' 2"  (1.575 m), weight 189 lb (85.7 kg), last menstrual period 05/04/2012, SpO2 97 %.   General: [name] apparent distress alert and oriented x3 mood and affect normal, dressed appropriately.  Patient is very minorly tearful with discussing her brother. HEENT: Pupils equal, extraocular movements intact  Respiratory: Patient's speak in full sentences and does not appear short of breath  Cardiovascular: [name] lower extremity edema, non tender, [name] erythema  Gait normal with good balance and coordination.  MSK:  Non tender with full range of motion and good stability and symmetric strength and tone of shoulders, elbows, wrist, hip, knee and ankles bilaterally.  Back -low back exam Mild loss of lordosis mostly in the lumbar spine. Neck exam also has loss of lordosis.  Tightness noted in the paraspinal and parascapular regions.  [name] midline tenderness.  5 out of 5 strength of the upper extremities.  Tightness noted with Sydney [name] right greater than left.  Osteopathic findings  C2 flexed rotated and side bent right T6 extended rotated and side bent right L3 flexed rotated and side bent right Sacrum right on right       Assessment and Plan:  NECK PAIN Some increase in neck pain recently.  Patient has been sleeping more and a recliner.  We discussed with patient about the possibility of sleeping more in the bed and proper pillows.  Discussed taking breaks and working on the exercises for the upper back.  Patient though is also grieving which is making it hard to  sleep.  We discussed potential medications which she would like to avoid though.  Patient will be referred to behavioral health to discuss the grieving.  Patient will follow up with me again 4 to 6 weeks.  Grieving Brother passed away unexpectedly of heart attack.    Nonallopathic problems  Decision today to treat with OMT was based on Physical Exam  After verbal  consent patient was treated with HVLA, ME, FPR techniques in cervical, thoracic, lumbar, and sacral  areas  Patient tolerated the procedure well with improvement in symptoms  Patient given exercises, stretches and lifestyle modifications  See medications in patient instructions if given  Patient will follow up in 4-6 weeks      The above documentation has been reviewed and is accurate and complete Sydney Pulley, DO       Note: This dictation was prepared with Dragon dictation along with smaller phrase technology. Any transcriptional errors that result from this process are unintentional.

## 2021-02-01 ENCOUNTER — Ambulatory Visit: Payer: Federal, State, Local not specified - PPO | Admitting: Family Medicine

## 2021-02-01 ENCOUNTER — Encounter: Payer: Self-pay | Admitting: Family Medicine

## 2021-02-01 ENCOUNTER — Other Ambulatory Visit: Payer: Self-pay

## 2021-02-01 VITALS — BP 132/80 | HR 79 | Ht 62.0 in | Wt 189.0 lb

## 2021-02-01 DIAGNOSIS — M999 Biomechanical lesion, unspecified: Secondary | ICD-10-CM

## 2021-02-01 DIAGNOSIS — F4321 Adjustment disorder with depressed mood: Secondary | ICD-10-CM | POA: Insufficient documentation

## 2021-02-01 DIAGNOSIS — F32A Depression, unspecified: Secondary | ICD-10-CM | POA: Diagnosis not present

## 2021-02-01 DIAGNOSIS — M542 Cervicalgia: Secondary | ICD-10-CM

## 2021-02-01 NOTE — Patient Instructions (Addendum)
Sorry for your loss Coop pillow Referral to behavioral health- Alroy Bailiff Monitor at eye level Take time for yourself and do exercises on your breaks See me again in 4-6 weeks

## 2021-02-01 NOTE — Assessment & Plan Note (Signed)
Some increase in neck pain recently.  Patient has been sleeping more and a recliner.  We discussed with patient about the possibility of sleeping more in the bed and proper pillows.  Discussed taking breaks and working on the exercises for the upper back.  Patient though is also grieving which is making it hard to sleep.  We discussed potential medications which she would like to avoid though.  Patient will be referred to behavioral health to discuss the grieving.  Patient will follow up with me again 4 to 6 weeks.

## 2021-02-01 NOTE — Assessment & Plan Note (Signed)
Brother passed away unexpectedly of heart attack.

## 2021-02-13 ENCOUNTER — Telehealth: Payer: Self-pay | Admitting: Internal Medicine

## 2021-02-13 NOTE — Telephone Encounter (Signed)
Patient is requesting a call back in regards to recent lab results. She can be reached at [phone].

## 2021-02-13 NOTE — Telephone Encounter (Signed)
Called pt inform her MD release result to her mychart, but gave her MD response.Marland KitchenJohny Chess

## 2021-02-21 ENCOUNTER — Other Ambulatory Visit: Payer: Self-pay

## 2021-02-22 ENCOUNTER — Ambulatory Visit: Payer: Federal, State, Local not specified - PPO | Admitting: Internal Medicine

## 2021-02-22 ENCOUNTER — Encounter: Payer: Self-pay | Admitting: Internal Medicine

## 2021-02-22 DIAGNOSIS — E559 Vitamin D deficiency, unspecified: Secondary | ICD-10-CM

## 2021-02-22 DIAGNOSIS — G4700 Insomnia, unspecified: Secondary | ICD-10-CM | POA: Diagnosis not present

## 2021-02-22 DIAGNOSIS — R413 Other amnesia: Secondary | ICD-10-CM

## 2021-02-22 DIAGNOSIS — Z6835 Body mass index (BMI) 35.0-35.9, adult: Secondary | ICD-10-CM

## 2021-02-22 DIAGNOSIS — F418 Other specified anxiety disorders: Secondary | ICD-10-CM | POA: Diagnosis not present

## 2021-02-22 DIAGNOSIS — F4321 Adjustment disorder with depressed mood: Secondary | ICD-10-CM

## 2021-02-22 DIAGNOSIS — Z8249 Family history of ischemic heart disease and other diseases of the circulatory system: Secondary | ICD-10-CM

## 2021-02-22 MED ORDER — ALPRAZOLAM 0.25 MG PO TABS: 0.2500 mg | ORAL_TABLET | Freq: Every evening | ORAL | 1 refills | Status: DC | PRN

## 2021-02-22 MED ORDER — ESCITALOPRAM OXALATE 5 MG PO TABS: 5.0000 mg | ORAL_TABLET | Freq: Every day | ORAL | 5 refills | Status: DC

## 2021-02-22 NOTE — Assessment & Plan Note (Signed)
3/22 A cardiac CT scan for coronary calcium offered B died at 36 M - A fib

## 2021-02-22 NOTE — Assessment & Plan Note (Signed)
On Vit D 

## 2021-02-22 NOTE — Progress Notes (Signed)
Subjective:  Patient ID: Sydney [name], female    DOB: Dec 02, 1966  Age: 55 y.o. MRN: [id number]  CC: Family Problem (Pt want to discuss FMLA.)   HPI Sydney [name] presents for HTN f/u Brother died in 2023/03/19 - ?MI at 25. C/o insomnia, can't focus A friend died  Outpatient Medications Prior to Visit  Medication Sig Dispense Refill  . Cholecalciferol (VITAMIN D3) 1.25 MG (50000 UT) CAPS Take 1 capsule by mouth once a week. 8 capsule 0  . Cholecalciferol (VITAMIN D3) 50 MCG (2000 UT) capsule Take 1 capsule (2,000 Units total) by mouth daily. 100 capsule 3  . hydrochlorothiazide (HYDRODIURIL) 25 MG tablet Take 1 tablet (25 mg total) by mouth daily. 90 tablet 3  . Liraglutide -Weight Management (SAXENDA) 18 MG/3ML SOPN Inject 3 mg into the skin daily. 3 mL 5  . losartan (COZAAR) 100 MG tablet Take 1 tablet (100 mg total) by mouth daily. 90 tablet 3  . phentermine (ADIPEX-P) 37.5 MG tablet Take 1 tablet (37.5 mg total) by mouth daily before breakfast. 30 tablet 2   No facility-administered medications prior to visit.    ROS: Review of Systems  Constitutional: Positive for fatigue. Negative for activity change, appetite change, chills and unexpected weight change.  HENT: Negative for congestion, mouth sores and sinus pressure.   Eyes: Negative for visual disturbance.  Respiratory: Negative for cough and chest tightness.   Gastrointestinal: Negative for abdominal pain and nausea.  Genitourinary: Negative for difficulty urinating, frequency and vaginal pain.  Musculoskeletal: Negative for back pain and gait problem.  Skin: Negative for pallor and rash.  Neurological: Negative for dizziness, tremors, weakness, numbness and headaches.  Psychiatric/Behavioral: Positive for decreased concentration, dysphoric mood and sleep disturbance. Negative for behavioral problems, confusion, self-injury and suicidal ideas. The patient is nervous/anxious.     Objective:  BP 138/90 (BP Location:  Left Arm)   Pulse 74   Temp 98.7 F (37.1 C) (Oral)   Ht 5\' 2"  (1.575 m)   Wt 192 lb 6.4 oz (87.3 kg)   LMP 05/04/2012   SpO2 96%   BMI 35.19 kg/m   BP Readings from Last 3 Encounters:  02/22/21 138/90  02/01/21 132/80  12/21/20 130/74    Wt Readings from Last 3 Encounters:  02/22/21 192 lb 6.4 oz (87.3 kg)  02/01/21 189 lb (85.7 kg)  12/21/20 192 lb 9.6 oz (87.4 kg)    Physical Exam Constitutional:      General: She is not in acute distress.    Appearance: She is well-developed.  HENT:     Head: Normocephalic.     Right Ear: External ear normal.     Left Ear: External ear normal.     Nose: Nose normal.  Eyes:     General:        Right eye: No discharge.        Left eye: No discharge.     Conjunctiva/sclera: Conjunctivae normal.     Pupils: Pupils are equal, round, and reactive to light.  Neck:     Thyroid: No thyromegaly.     Vascular: No JVD.     Trachea: No tracheal deviation.  Cardiovascular:     Rate and Rhythm: Normal rate and regular rhythm.     Heart sounds: Normal heart sounds.  Pulmonary:     Effort: No respiratory distress.     Breath sounds: No stridor. No wheezing.  Abdominal:     General: Bowel sounds are normal. There is no  distension.     Palpations: Abdomen is soft. There is no mass.     Tenderness: There is no abdominal tenderness. There is no guarding or rebound.  Musculoskeletal:        General: No tenderness.     Cervical back: Normal range of motion and neck supple.  Lymphadenopathy:     Cervical: No cervical adenopathy.  Skin:    Findings: No erythema or rash.  Neurological:     Mental Status: She is oriented to person, place, and time.     Cranial Nerves: No cranial nerve deficit.     Motor: No abnormal muscle tone.     Coordination: Coordination normal.     Deep Tendon Reflexes: Reflexes normal.  Psychiatric:        Behavior: Behavior normal.        Thought Content: Thought content normal.        Judgment: Judgment normal.    sad    A total time of >45 minutes was spent preparing to see the patient.  Also, obtaining history and performing comprehensive physical exam.  Additionally, counseling the patient regarding grief, anxiety.   Finally, documenting clinical information in the health records, coordination of care, educating the patient. FMLA was discussed   Lab Results  Component Value Date   WBC 4.8 01/26/2021   HGB 13.1 01/26/2021   HCT 39.1 01/26/2021   PLT 255.0 01/26/2021   GLUCOSE 87 01/26/2021   CHOL 186 01/26/2021   TRIG 211.0 (H) 01/26/2021   HDL 61.80 01/26/2021   LDLDIRECT 97.0 01/26/2021   LDLCALC 67 03/24/2018   ALT 16 01/26/2021   ALT 16 01/26/2021   AST 15 01/26/2021   AST 15 01/26/2021   NA 141 01/26/2021   K 3.8 01/26/2021   CL 105 01/26/2021   CREATININE 0.94 01/26/2021   BUN 24 (H) 01/26/2021   CO2 27 01/26/2021   TSH 1.64 01/26/2021   HGBA1C 6.0 01/26/2021    MM DIAG BREAST TOMO UNI RIGHT  Result Date: 12/07/2019 CLINICAL DATA:  Patient recalled from screening for right breast asymmetry. EXAM: DIGITAL DIAGNOSTIC UNILATERAL RIGHT MAMMOGRAM WITH CAD AND TOMO COMPARISON:  Previous exam(s). ACR Breast Density Category d: The breast tissue is extremely dense, which lowers the sensitivity of mammography. FINDINGS: Questioned asymmetry within the slightly outer right breast resolved with additional imaging including spot compression CC and MLO tomosynthesis images. Mammographic images were processed with CAD. IMPRESSION: No mammographic evidence for malignancy. RECOMMENDATION: Screening mammogram in one year.(Code:[id number]) I have discussed the findings and recommendations with the patient. If applicable, a reminder letter will be sent to the patient regarding the next appointment. BI-RADS CATEGORY  1: Negative. Electronically Signed   By: Lovey Newcomer M.D.   On: 12/07/2019 15:54    Assessment & Plan:   There are no diagnoses linked to this encounter.   No orders of the defined  types were placed in this encounter.    Follow-up: No follow-ups on file.  Walker Kehr, MD

## 2021-02-22 NOTE — Assessment & Plan Note (Signed)
Grief related Treat sleep Try [employer] FMLA 4d/mo off

## 2021-02-22 NOTE — Patient Instructions (Addendum)
Cardiac CT calcium scoring test $99 Tel # is [phone]   Computed tomography, more commonly known as a CT or CAT scan, is a diagnostic medical imaging test. Like traditional x-rays, it produces multiple images or pictures of the inside of the body. The cross-sectional images generated during a CT scan can be reformatted in multiple planes. They can even generate three-dimensional images. These images can be viewed on a computer monitor, printed on film or by a 3D printer, or transferred to a CD or DVD. CT images of internal organs, bones, soft tissue and blood vessels provide greater detail than traditional x-rays, particularly of soft tissues and blood vessels. A cardiac CT scan for coronary calcium is a non-invasive way of obtaining information about the presence, location and extent of calcified plaque in the coronary arteries--the vessels that supply oxygen-containing blood to the heart muscle. Calcified plaque results when there is a build-up of fat and other substances under the inner layer of the artery. This material can calcify which signals the presence of atherosclerosis, a disease of the vessel wall, also called coronary artery disease (CAD). People with this disease have an increased risk for heart attacks. In addition, over time, progression of plaque build up (CAD) can narrow the arteries or even close off blood flow to the heart. The result may be chest pain, sometimes called "angina," or a heart attack. Because calcium is a marker of CAD, the amount of calcium detected on a cardiac CT scan is a helpful prognostic tool. The findings on cardiac CT are expressed as a calcium score. Another name for this test is coronary artery calcium scoring.  What are some common uses of the procedure? The goal of cardiac CT scan for calcium scoring is to determine if CAD is present and to what extent, even if there are no symptoms. It is a screening study that may be recommended by a physician for  patients with risk factors for CAD but no clinical symptoms. The major risk factors for CAD are: . high blood cholesterol levels  . family history of heart attacks  . diabetes  . high blood pressure  . cigarette smoking  . overweight or obese  . physical inactivity   A negative cardiac CT scan for calcium scoring shows no calcification within the coronary arteries. This suggests that CAD is absent or so minimal it cannot be seen by this technique. The chance of having a heart attack over the next two to five years is very low under these circumstances. A positive test means that CAD is present, regardless of whether or not the patient is experiencing any symptoms. The amount of calcification--expressed as the calcium score--may help to predict the likelihood of a myocardial infarction (heart attack) in the coming years and helps your medical doctor or cardiologist decide whether the patient may need to take preventive medicine or undertake other measures such as diet and exercise to lower the risk for heart attack. The extent of CAD is graded according to your calcium score:  Calcium Score  Presence of CAD (coronary artery disease)  0 No evidence of CAD   1-10 Minimal evidence of CAD  11-100 Mild evidence of CAD  101-400 Moderate evidence of CAD  Over 400 Extensive evidence of CAD     You can try [employer] Mush[employer] capsules for memory, focus, neuropathy ([email or web address])

## 2021-02-22 NOTE — Assessment & Plan Note (Signed)
Wt Readings from Last 3 Encounters:  02/22/21 192 lb 6.4 oz (87.3 kg)  02/01/21 189 lb (85.7 kg)  12/21/20 192 lb 9.6 oz (87.4 kg)

## 2021-02-22 NOTE — Assessment & Plan Note (Signed)
Xanax prn Can try Lexapro low dose  Potential benefits of a long term benzodiazepines  use as well as potential risks  and complications were explained to the patient and were aknowledged.

## 2021-02-22 NOTE — Assessment & Plan Note (Addendum)
Discussed FMLA 1d per 1wk, 4 d per 1 mo

## 2021-03-01 DIAGNOSIS — Z0279 Encounter for issue of other medical certificate: Secondary | ICD-10-CM

## 2021-03-15 NOTE — Progress Notes (Deleted)
  [place] Phone: [phone] Subjective:    I'm seeing this patient by the request  of:  Plotnikov, Evie Lacks, MD  CC:   GNO:[id number]  Sydney [name] is a 55 y.o. female coming in with complaint of back and neck pain Patient states   Medications patient has been prescribed:   Taking:         Reviewed prior external information including notes and imaging from previsou exam, outside providers and external EMR if available.   As well as notes that were available from care everywhere and other healthcare systems.  Past medical history, social, surgical and family history all reviewed in electronic medical record.  No pertanent information unless stated regarding to the chief complaint.   Past Medical History:  Diagnosis Date  . Hypertension     Allergies  Allergen Reactions  . Shellfish Allergy Hives    Facial urticaria  . Enalapril Maleate     REACTION: cough     Review of Systems:  No headache, visual changes, nausea, vomiting, diarrhea, constipation, dizziness, abdominal pain, skin rash, fevers, chills, night sweats, weight loss, swollen lymph nodes, body aches, joint swelling, chest pain, shortness of breath, mood changes. POSITIVE muscle aches  Objective  Last menstrual period 05/04/2012.   General: No apparent distress alert and oriented x3 mood and affect normal, dressed appropriately.  HEENT: Pupils equal, extraocular movements intact  Respiratory: Patient's speak in full sentences and does not appear short of breath  Cardiovascular: No lower extremity edema, non tender, no erythema  Neuro: Cranial nerves II through XII are intact, neurovascularly intact in all extremities with 2+ DTRs and 2+ pulses.  Gait normal with good balance and coordination.  MSK:  Non tender with full range of motion and good stability and symmetric strength and tone of shoulders, elbows, wrist, hip, knee and  ankles bilaterally.  Back - Normal skin, Spine with normal alignment and no deformity.  No tenderness to vertebral process palpation.  Paraspinous muscles are not tender and without spasm.   Range of motion is full at neck and lumbar sacral regions  Osteopathic findings  C2 flexed rotated and side bent right C6 flexed rotated and side bent left T3 extended rotated and side bent right inhaled rib T9 extended rotated and side bent left L2 flexed rotated and side bent right Sacrum right on right       Assessment and Plan:    Nonallopathic problems  Decision today to treat with OMT was based on Physical Exam  After verbal consent patient was treated with HVLA, ME, FPR techniques in cervical, rib, thoracic, lumbar, and sacral  areas  Patient tolerated the procedure well with improvement in symptoms  Patient given exercises, stretches and lifestyle modifications  See medications in patient instructions if given  Patient will follow up in 4-8 weeks      The above documentation has been reviewed and is accurate and complete Lyndal Pulley, DO       Note: This dictation was prepared with Dragon dictation along with smaller phrase technology. Any transcriptional errors that result from this process are unintentional.

## 2021-03-16 ENCOUNTER — Ambulatory Visit: Payer: Federal, State, Local not specified - PPO | Admitting: Family Medicine

## 2021-03-20 ENCOUNTER — Telehealth: Payer: Self-pay | Admitting: Internal Medicine

## 2021-03-20 DIAGNOSIS — Z8249 Family history of ischemic heart disease and other diseases of the circulatory system: Secondary | ICD-10-CM

## 2021-03-20 NOTE — Telephone Encounter (Signed)
The order is placed.  She can call.  Thanks

## 2021-03-20 NOTE — Telephone Encounter (Signed)
Patient called and said she called the number given to her for her to schedule a Cardiac CT calcium scoring test. She said that they told her Dr. Alain Marion would have to call and make the appt. Please advise

## 2021-03-22 NOTE — Telephone Encounter (Signed)
Notified pt w/MD response.../lmb 

## 2021-03-27 ENCOUNTER — Other Ambulatory Visit: Payer: Self-pay | Admitting: Internal Medicine

## 2021-04-06 ENCOUNTER — Ambulatory Visit: Payer: Federal, State, Local not specified - PPO | Admitting: Internal Medicine

## 2021-05-02 ENCOUNTER — Ambulatory Visit: Payer: Federal, State, Local not specified - PPO | Admitting: Internal Medicine

## 2021-05-03 ENCOUNTER — Inpatient Hospital Stay: Admission: RE | Admit: 2021-05-03 | Payer: Self-pay | Source: Ambulatory Visit

## 2021-05-04 DIAGNOSIS — Z01411 Encounter for gynecological examination (general) (routine) with abnormal findings: Secondary | ICD-10-CM | POA: Diagnosis not present

## 2021-05-04 DIAGNOSIS — Z01419 Encounter for gynecological examination (general) (routine) without abnormal findings: Secondary | ICD-10-CM | POA: Diagnosis not present

## 2021-05-04 DIAGNOSIS — Z1231 Encounter for screening mammogram for malignant neoplasm of breast: Secondary | ICD-10-CM | POA: Diagnosis not present

## 2021-05-12 ENCOUNTER — Ambulatory Visit (INDEPENDENT_AMBULATORY_CARE_PROVIDER_SITE_OTHER)
Admission: RE | Admit: 2021-05-12 | Discharge: 2021-05-12 | Disposition: A | Discharge status: Home / Self Care | Payer: Self-pay | Source: Ambulatory Visit | Attending: Internal Medicine | Admitting: Internal Medicine

## 2021-05-12 ENCOUNTER — Other Ambulatory Visit: Payer: Self-pay

## 2021-05-12 DIAGNOSIS — Z8249 Family history of ischemic heart disease and other diseases of the circulatory system: Secondary | ICD-10-CM

## 2021-05-30 DIAGNOSIS — R87619 Unspecified abnormal cytological findings in specimens from cervix uteri: Secondary | ICD-10-CM | POA: Diagnosis not present

## 2021-05-30 DIAGNOSIS — N870 Mild cervical dysplasia: Secondary | ICD-10-CM | POA: Diagnosis not present

## 2021-06-15 ENCOUNTER — Telehealth: Payer: Self-pay | Admitting: Internal Medicine

## 2021-06-15 NOTE — Telephone Encounter (Signed)
Error

## 2021-06-30 NOTE — Telephone Encounter (Signed)
MD completed FMLA form has ben faxed back and notified pt to pick up original../lmb

## 2021-07-03 ENCOUNTER — Telehealth: Payer: Self-pay | Admitting: Internal Medicine

## 2021-07-03 NOTE — Telephone Encounter (Signed)
Seeking advice from covering provider  Patient calling to request advice for cough. Declined virtual visit Patient would like to know what she should take OTC for cough that will not raise her blood pressure  Please advise

## 2021-07-03 NOTE — Telephone Encounter (Signed)
Notified pt w/MD response.../lmb 

## 2021-07-03 NOTE — Telephone Encounter (Signed)
She can take mucinex or delsym cough syrup

## 2021-07-31 ENCOUNTER — Other Ambulatory Visit: Payer: Self-pay

## 2021-07-31 ENCOUNTER — Encounter: Payer: Self-pay | Admitting: Internal Medicine

## 2021-07-31 ENCOUNTER — Encounter: Payer: Federal, State, Local not specified - PPO | Admitting: Internal Medicine

## 2021-08-17 ENCOUNTER — Other Ambulatory Visit: Payer: Self-pay

## 2021-08-17 ENCOUNTER — Ambulatory Visit: Payer: Federal, State, Local not specified - PPO | Admitting: Internal Medicine

## 2021-08-17 ENCOUNTER — Encounter: Payer: Self-pay | Admitting: Internal Medicine

## 2021-08-17 DIAGNOSIS — L309 Dermatitis, unspecified: Secondary | ICD-10-CM | POA: Diagnosis not present

## 2021-08-17 DIAGNOSIS — F4321 Adjustment disorder with depressed mood: Secondary | ICD-10-CM

## 2021-08-17 DIAGNOSIS — G4700 Insomnia, unspecified: Secondary | ICD-10-CM | POA: Diagnosis not present

## 2021-08-17 DIAGNOSIS — E559 Vitamin D deficiency, unspecified: Secondary | ICD-10-CM | POA: Diagnosis not present

## 2021-08-17 MED ORDER — FINASTERIDE 5 MG PO TABS: ORAL_TABLET | ORAL | 3 refills | Status: AC

## 2021-08-17 MED ORDER — TRIAMCINOLONE ACETONIDE 0.1 % EX CREA: 1.0000 "application " | TOPICAL_CREAM | Freq: Two times a day (BID) | CUTANEOUS | 1 refills | Status: DC

## 2021-08-17 MED ORDER — QUETIAPINE FUMARATE 100 MG PO TABS: 100.0000 mg | ORAL_TABLET | Freq: Every day | ORAL | 5 refills | Status: DC

## 2021-08-17 NOTE — Progress Notes (Signed)
Subjective:  Patient ID: Sydney [name], female    DOB: Dec 24, 1965  Age: 55 y.o. MRN: [id number]  CC: No chief complaint on file.   HPI Sydney [name] presents for insomnia - not better.  We tried multiple meds.  No effect.  She states her mind is racing not allowing her to fall asleep.  She is not getting enough rest at night. C/o rash on face that appeared a week or 2 ago-bumps around the mouth without itching or pain F/u grief-not better   Outpatient Medications Prior to Visit  Medication Sig Dispense Refill   ALPRAZolam (XANAX) 0.25 MG tablet Take 1-2 tablets (0.25-0.5 mg total) by mouth at bedtime as needed for anxiety or sleep. 60 tablet 1   Cholecalciferol (VITAMIN D3) 50 MCG (2000 UT) capsule Take 1 capsule (2,000 Units total) by mouth daily. 100 capsule 3   escitalopram (LEXAPRO) 5 MG tablet Take 1 tablet (5 mg total) by mouth daily. 30 tablet 5   hydrochlorothiazide (HYDRODIURIL) 25 MG tablet Take 1 tablet by mouth once daily 90 tablet 2   Liraglutide -Weight Management (SAXENDA) 18 MG/3ML SOPN Inject 3 mg into the skin daily. 3 mL 5   losartan (COZAAR) 100 MG tablet Take 1 tablet by mouth once daily 90 tablet 2   No facility-administered medications prior to visit.    ROS: Review of Systems  Constitutional:  Positive for fatigue. Negative for activity change, appetite change, chills and unexpected weight change.  HENT:  Negative for congestion, mouth sores and sinus pressure.   Eyes:  Negative for visual disturbance.  Respiratory:  Negative for cough and chest tightness.   Gastrointestinal:  Negative for abdominal pain and nausea.  Genitourinary:  Negative for difficulty urinating, frequency and vaginal pain.  Musculoskeletal:  Negative for back pain and gait problem.  Skin:  Positive for rash. Negative for pallor.  Neurological:  Negative for dizziness, tremors, weakness, numbness and headaches.  Psychiatric/Behavioral:  Positive for decreased concentration and  sleep disturbance. Negative for confusion and suicidal ideas. The patient is nervous/anxious.    Objective:  BP 112/80 (BP Location: Left Arm)   Pulse 60   Temp 98.1 F (36.7 C) (Oral)   Ht '5\' 2"'$  (1.575 m)   Wt 172 lb (78 kg)   LMP 05/04/2012   SpO2 96%   BMI 31.46 kg/m   BP Readings from Last 3 Encounters:  08/17/21 112/80  07/31/21 128/82  02/22/21 138/90    Wt Readings from Last 3 Encounters:  08/17/21 172 lb (78 kg)  07/31/21 172 lb (78 kg)  02/22/21 192 lb 6.4 oz (87.3 kg)    Physical Exam Constitutional:      General: She is not in acute distress.    Appearance: Normal appearance. She is well-developed.  HENT:     Head: Normocephalic.     Right Ear: External ear normal.     Left Ear: External ear normal.     Nose: Nose normal.  Eyes:     General:        Right eye: No discharge.        Left eye: No discharge.     Conjunctiva/sclera: Conjunctivae normal.     Pupils: Pupils are equal, round, and reactive to light.  Neck:     Thyroid: No thyromegaly.     Vascular: No JVD.     Trachea: No tracheal deviation.  Cardiovascular:     Rate and Rhythm: Normal rate and regular rhythm.     Heart  sounds: Normal heart sounds.  Pulmonary:     Effort: No respiratory distress.     Breath sounds: No stridor. No wheezing.  Abdominal:     General: Bowel sounds are normal. There is no distension.     Palpations: Abdomen is soft. There is no mass.     Tenderness: There is no abdominal tenderness. There is no guarding or rebound.  Musculoskeletal:        General: No tenderness.     Cervical back: Normal range of motion and neck supple. No rigidity.  Lymphadenopathy:     Cervical: No cervical adenopathy.  Skin:    Findings: Rash present. No erythema.  Neurological:     Mental Status: She is oriented to person, place, and time.     Cranial Nerves: No cranial nerve deficit.     Motor: No abnormal muscle tone.     Coordination: Coordination normal.     Gait: Gait normal.      Deep Tendon Reflexes: Reflexes normal.  Psychiatric:        Behavior: Behavior normal.        Thought Content: Thought content normal.        Judgment: Judgment normal.  Skin colored papules 1 mm or less in size in nasolabial folds and around the mouth  Lab Results  Component Value Date   WBC 4.8 01/26/2021   HGB 13.1 01/26/2021   HCT 39.1 01/26/2021   PLT 255.0 01/26/2021   GLUCOSE 87 01/26/2021   CHOL 186 01/26/2021   TRIG 211.0 (H) 01/26/2021   HDL 61.80 01/26/2021   LDLDIRECT 97.0 01/26/2021   LDLCALC 67 03/24/2018   ALT 16 01/26/2021   ALT 16 01/26/2021   AST 15 01/26/2021   AST 15 01/26/2021   NA 141 01/26/2021   K 3.8 01/26/2021   CL 105 01/26/2021   CREATININE 0.94 01/26/2021   BUN 24 (H) 01/26/2021   CO2 27 01/26/2021   TSH 1.64 01/26/2021   HGBA1C 6.0 01/26/2021    CT CARDIAC SCORING (SELF PAY ONLY)  Addendum Date: 05/12/2021   ADDENDUM REPORT: 05/12/2021 21:34 CLINICAL DATA:  Cardiovascular disease risk stratification EXAM: CT Coronary Calcium Score TECHNIQUE: A gated, non-contrast computed tomography scan of the heart was performed using 103m slice thickness. Axial images were analyzed on a dedicated workstation. Calcium scoring of the coronary arteries was performed using the Agatston method. FINDINGS: Coronary Calcium Score: Left main: 0 Left anterior descending artery: 0 Left circumflex artery: 0 Right coronary artery: 0 Total: 0 Pericardium: Normal. Ascending Aorta: Not well visualized for measurement. Non-cardiac: [facility]. IMPRESSION: Coronary calcium score of 0. RECOMMENDATIONS: Coronary artery calcium (CAC) score is a strong predictor of incident coronary heart disease (CHD) and provides predictive information beyond traditional risk factors. CAC scoring is reasonable to use in the decision to withhold, postpone, or initiate statin therapy in intermediate-risk or selected borderline-risk asymptomatic adults (age 913-75 years and LDL-C >=70 to <190 mg/dL) who do not have diabetes or established atherosclerotic cardiovascular disease (ASCVD).* In intermediate-risk (10-year ASCVD risk >=7.5% to <20%) adults or selected borderline-risk (10-year ASCVD risk >=5% to <7.5%) adults in whom a CAC score is measured for the purpose of making a treatment decision the following recommendations have been made: If CAC=0, it is reasonable to withhold statin therapy and reassess in 5 to 10 years, as long as higher risk conditions are absent (diabetes mellitus, family history of premature CHD in first degree relatives (males <55 years;  females <65 years), cigarette smoking, or LDL >=190 mg/dL). If CAC is 1 to 99, it is reasonable to initiate statin therapy for patients >=28 years of age. If CAC is >=100 or >=75th percentile, it is reasonable to initiate statin therapy at any age. Cardiology referral should be considered for patients with CAC scores >=400 or >=75th percentile. *2018 AHA/ACC/AACVPR/AAPA/ABC/ACPM/ADA/AGS/APhA/ASPC/NLA/PCNA Guideline on the Management of Blood Cholesterol: A Report of the American College of Cardiology/American Heart Association Task Force on Clinical Practice Guidelines. J Am Coll Cardiol. 2019;73(24):3168-3209. Electronically Signed   By: Cherlynn Kaiser   On: 05/12/2021 21:34   Result Date: 05/12/2021 EXAM: OVER-READ INTERPRETATION  CT CHEST The following report is an over-read performed by radiologist Dr. Abigail Miyamoto of [facility], [place] on 05/12/2021. This over-read does not include interpretation of cardiac or coronary anatomy or pathology. The calcium score interpretation by the cardiologist is attached. COMPARISON:  Chest radiograph 07/30/2007 FINDINGS: Vascular: Normal aortic caliber. Mediastinum/Nodes: No imaged thoracic adenopathy. Lungs/Pleura: No pleural fluid. Lingular scarring or subsegmental atelectasis. Upper Abdomen: Normal imaged portions of the liver, spleen, stomach. Musculoskeletal: No  acute osseous abnormality. Upper to midthoracic spondylosis. IMPRESSION: No acute findings in the imaged extracardiac chest. Electronically Signed: By: Abigail Miyamoto M.D. On: 05/12/2021 16:23    Assessment & Plan:   Problem List Items Addressed This Visit     Dermatitis    Face-perioral distribution.  We will try topical steroids.  If no effect next few days-switch to antibiotic cream      Grief    We will treat insomnia.  Seroquel as above.  Continue escitalopram      Insomnia    Refractory.  This has been a challenging issue We can try Risperdal or  Seroquel-options discussed.  We will try Seroquel 100 mg tablets-she will start with one quarter or one half at bedtime and titrate up.  Take 1 hour prior to sleep time.  The patient can continue with escitalopram as well.  Side effects, risks are discussed.      Vitamin D deficiency    Continue with vitamin D         Follow-up: Return in about 2 months (around 10/17/2021) for a follow-up visit.  Walker Kehr, MD

## 2021-08-17 NOTE — Assessment & Plan Note (Addendum)
Refractory.  This has been a challenging issue We can try Risperdal or  Seroquel-options discussed.  We will try Seroquel 100 mg tablets-she will start with one quarter or one half at bedtime and titrate up.  Take 1 hour prior to sleep time.  The patient can continue with escitalopram as well.  Side effects, risks are discussed.

## 2021-08-17 NOTE — Patient Instructions (Signed)
  BP Readings from Last 3 Encounters:  08/17/21 112/80  07/31/21 128/82  02/22/21 138/90

## 2021-08-18 DIAGNOSIS — Z713 Dietary counseling and surveillance: Secondary | ICD-10-CM | POA: Diagnosis not present

## 2021-08-20 ENCOUNTER — Encounter: Payer: Self-pay | Admitting: Internal Medicine

## 2021-08-20 DIAGNOSIS — L309 Dermatitis, unspecified: Secondary | ICD-10-CM | POA: Insufficient documentation

## 2021-08-20 NOTE — Assessment & Plan Note (Signed)
Face-perioral distribution.  We will try topical steroids.  If no effect next few days-switch to antibiotic cream

## 2021-08-20 NOTE — Assessment & Plan Note (Signed)
We will treat insomnia.  Seroquel as above.  Continue escitalopram

## 2021-08-20 NOTE — Assessment & Plan Note (Signed)
Continue with vitamin D 

## 2021-08-25 ENCOUNTER — Telehealth: Payer: Self-pay | Admitting: Internal Medicine

## 2021-08-25 NOTE — Telephone Encounter (Signed)
   Patient requesting new order for phentermine (ADIPEX-P) 37.5 MG tablet   [name], Sydney [address]

## 2021-08-28 NOTE — Telephone Encounter (Signed)
MD out of office will hold for his response when he return.Marland KitchenJohny Chess

## 2021-08-29 MED ORDER — PHENTERMINE HCL 37.5 MG PO TABS: 37.5000 mg | ORAL_TABLET | Freq: Every day | ORAL | 2 refills | Status: DC

## 2021-08-29 NOTE — Telephone Encounter (Signed)
Ok Thx 

## 2021-08-29 NOTE — Telephone Encounter (Signed)
Notified pt MD sent rx to [facility]../lmb 

## 2021-09-01 DIAGNOSIS — Z713 Dietary counseling and surveillance: Secondary | ICD-10-CM | POA: Diagnosis not present

## 2021-09-18 DIAGNOSIS — Z713 Dietary counseling and surveillance: Secondary | ICD-10-CM | POA: Diagnosis not present

## 2021-10-06 DIAGNOSIS — Z713 Dietary counseling and surveillance: Secondary | ICD-10-CM | POA: Diagnosis not present

## 2021-10-23 DIAGNOSIS — Z713 Dietary counseling and surveillance: Secondary | ICD-10-CM | POA: Diagnosis not present

## 2021-11-17 DIAGNOSIS — Z713 Dietary counseling and surveillance: Secondary | ICD-10-CM | POA: Diagnosis not present

## 2021-11-23 DIAGNOSIS — K080 Exfoliation of teeth due to systemic causes: Secondary | ICD-10-CM | POA: Diagnosis not present

## 2021-11-29 ENCOUNTER — Other Ambulatory Visit: Payer: Self-pay

## 2021-11-29 ENCOUNTER — Ambulatory Visit (INDEPENDENT_AMBULATORY_CARE_PROVIDER_SITE_OTHER): Payer: Federal, State, Local not specified - PPO

## 2021-11-29 DIAGNOSIS — Z23 Encounter for immunization: Secondary | ICD-10-CM | POA: Diagnosis not present

## 2021-12-05 ENCOUNTER — Ambulatory Visit: Payer: Federal, State, Local not specified - PPO | Admitting: Internal Medicine

## 2021-12-12 ENCOUNTER — Ambulatory Visit: Payer: Federal, State, Local not specified - PPO | Admitting: Internal Medicine

## 2021-12-28 DIAGNOSIS — Z713 Dietary counseling and surveillance: Secondary | ICD-10-CM | POA: Diagnosis not present

## 2022-01-22 DIAGNOSIS — Z713 Dietary counseling and surveillance: Secondary | ICD-10-CM | POA: Diagnosis not present

## 2022-01-23 ENCOUNTER — Encounter: Payer: Federal, State, Local not specified - PPO | Admitting: Internal Medicine

## 2022-01-30 ENCOUNTER — Other Ambulatory Visit: Payer: Self-pay

## 2022-01-30 ENCOUNTER — Ambulatory Visit (INDEPENDENT_AMBULATORY_CARE_PROVIDER_SITE_OTHER): Payer: Federal, State, Local not specified - PPO | Admitting: Internal Medicine

## 2022-01-30 ENCOUNTER — Encounter: Payer: Self-pay | Admitting: Internal Medicine

## 2022-01-30 VITALS — BP 116/80 | HR 65 | Temp 98.8°F | Ht 62.0 in | Wt 179.0 lb

## 2022-01-30 DIAGNOSIS — R739 Hyperglycemia, unspecified: Secondary | ICD-10-CM

## 2022-01-30 DIAGNOSIS — R635 Abnormal weight gain: Secondary | ICD-10-CM

## 2022-01-30 DIAGNOSIS — E559 Vitamin D deficiency, unspecified: Secondary | ICD-10-CM | POA: Diagnosis not present

## 2022-01-30 DIAGNOSIS — F4321 Adjustment disorder with depressed mood: Secondary | ICD-10-CM | POA: Diagnosis not present

## 2022-01-30 DIAGNOSIS — G4700 Insomnia, unspecified: Secondary | ICD-10-CM

## 2022-01-30 LAB — COMPREHENSIVE METABOLIC PANEL
ALT: 16 U/L (ref 0–35)
AST: 14 U/L (ref 0–37)
Albumin: 4.2 g/dL (ref 3.5–5.2)
Alkaline Phosphatase: 74 U/L (ref 39–117)
BUN: 23 mg/dL (ref 6–23)
CO2: 31 mEq/L (ref 19–32)
Calcium: 9.5 mg/dL (ref 8.4–10.5)
Chloride: 101 mEq/L (ref 96–112)
Creatinine, Ser: 0.89 mg/dL (ref 0.40–1.20)
GFR: 73.91 mL/min (ref >60.00)
Glucose, Bld: 79 mg/dL (ref 70–99)
Potassium: 3.6 mEq/L (ref 3.5–5.1)
Sodium: 138 mEq/L (ref 135–145)
Total Bilirubin: 0.3 mg/dL (ref 0.2–1.2)
Total Protein: 7.2 g/dL (ref 6.0–8.3)

## 2022-01-30 LAB — HEMOGLOBIN A1C: Hgb A1c MFr Bld: 5.9 % (ref 4.6–6.5)

## 2022-01-30 LAB — TSH: TSH: 1.99 u[IU]/mL (ref 0.35–5.50)

## 2022-01-30 MED ORDER — PHENTERMINE HCL 37.5 MG PO TABS: 37.5000 mg | ORAL_TABLET | Freq: Every day | ORAL | 2 refills | Status: DC

## 2022-01-30 NOTE — Assessment & Plan Note (Signed)
Brother died in March 22 - ?MI at 23  Seroquel as above.  Continue escitalopram

## 2022-01-30 NOTE — Progress Notes (Signed)
Subjective:  Patient ID: Sydney [name], female    DOB: 04/06/1966  Age: 56 y.o. MRN: [id number]  CC: Annual Exam   HPI Dovey Fatzinger presents for depression/grief, insomnia. C/o wt issues  Outpatient Medications Prior to Visit  Medication Sig Dispense Refill   ALPRAZolam (XANAX) 0.25 MG tablet Take 1-2 tablets (0.25-0.5 mg total) by mouth at bedtime as needed for anxiety or sleep. 60 tablet 1   Cholecalciferol (VITAMIN D3) 50 MCG (2000 UT) capsule Take 1 capsule (2,000 Units total) by mouth daily. 100 capsule 3   escitalopram (LEXAPRO) 5 MG tablet Take 1 tablet (5 mg total) by mouth daily. 30 tablet 5   finasteride (PROSCAR) 5 MG tablet 1/4 tab daily for hair loss 30 tablet 3   hydrochlorothiazide (HYDRODIURIL) 25 MG tablet Take 1 tablet by mouth once daily 90 tablet 2   Liraglutide -Weight Management (SAXENDA) 18 MG/3ML SOPN Inject 3 mg into the skin daily. 3 mL 5   losartan (COZAAR) 100 MG tablet Take 1 tablet by mouth once daily 90 tablet 2   QUEtiapine (SEROQUEL) 100 MG tablet Take 1 tablet (100 mg total) by mouth at bedtime. For insomnia 30 tablet 5   triamcinolone cream (KENALOG) 0.1 % Apply 1 application topically 2 (two) times daily. 80 g 1   phentermine (ADIPEX-P) 37.5 MG tablet Take 1 tablet (37.5 mg total) by mouth daily before breakfast. 30 tablet 2   Cholecalciferol (VITAMIN D) 50 MCG (2000 UT) tablet 1 tablet     No facility-administered medications prior to visit.    ROS: Review of Systems  Constitutional:  Negative for activity change, appetite change, chills, fatigue and unexpected weight change.  HENT:  Negative for congestion, mouth sores and sinus pressure.   Eyes:  Negative for visual disturbance.  Respiratory:  Negative for cough and chest tightness.   Gastrointestinal:  Negative for abdominal pain and nausea.  Genitourinary:  Negative for difficulty urinating, frequency and vaginal pain.  Musculoskeletal:  Negative for back pain and gait problem.  Skin:   Negative for pallor and rash.  Neurological:  Negative for dizziness, tremors, weakness, numbness and headaches.  Psychiatric/Behavioral:  Positive for dysphoric mood. Negative for confusion, sleep disturbance and suicidal ideas.    Objective:  BP 116/80 (BP Location: Left Arm, Patient Position: Sitting, Cuff Size: Large)    Pulse 65    Temp 98.8 F (37.1 C) (Oral)    Ht 5\' 2"  (1.575 m)    Wt 179 lb (81.2 kg)    LMP 05/04/2012    SpO2 99%    BMI 32.74 kg/m   BP Readings from Last 3 Encounters:  01/30/22 116/80  08/17/21 112/80  07/31/21 128/82    Wt Readings from Last 3 Encounters:  01/30/22 179 lb (81.2 kg)  08/17/21 172 lb (78 kg)  07/31/21 172 lb (78 kg)    Physical Exam Constitutional:      General: She is not in acute distress.    Appearance: She is well-developed. She is obese.  HENT:     Head: Normocephalic.     Right Ear: External ear normal.     Left Ear: External ear normal.     Nose: Nose normal.  Eyes:     General:        Right eye: No discharge.        Left eye: No discharge.     Conjunctiva/sclera: Conjunctivae normal.     Pupils: Pupils are equal, round, and reactive to light.  Neck:  Thyroid: No thyromegaly.     Vascular: No JVD.     Trachea: No tracheal deviation.  Cardiovascular:     Rate and Rhythm: Normal rate and regular rhythm.     Heart sounds: Normal heart sounds.  Pulmonary:     Effort: No respiratory distress.     Breath sounds: No stridor. No wheezing.  Abdominal:     General: Bowel sounds are normal. There is no distension.     Palpations: Abdomen is soft. There is no mass.     Tenderness: There is no abdominal tenderness. There is no guarding or rebound.  Musculoskeletal:        General: No tenderness.     Cervical back: Normal range of motion and neck supple. No rigidity.  Lymphadenopathy:     Cervical: No cervical adenopathy.  Skin:    Findings: No erythema or rash.  Neurological:     Cranial Nerves: No cranial nerve  deficit.     Motor: No abnormal muscle tone.     Coordination: Coordination normal.     Deep Tendon Reflexes: Reflexes normal.  Psychiatric:        Behavior: Behavior normal.        Thought Content: Thought content normal.        Judgment: Judgment normal.    Lab Results  Component Value Date   WBC 4.8 01/26/2021   HGB 13.1 01/26/2021   HCT 39.1 01/26/2021   PLT 255.0 01/26/2021   GLUCOSE 79 01/30/2022   CHOL 186 01/26/2021   TRIG 211.0 (H) 01/26/2021   HDL 61.80 01/26/2021   LDLDIRECT 97.0 01/26/2021   LDLCALC 67 03/24/2018   ALT 16 01/30/2022   AST 14 01/30/2022   NA 138 01/30/2022   K 3.6 01/30/2022   CL 101 01/30/2022   CREATININE 0.89 01/30/2022   BUN 23 01/30/2022   CO2 31 01/30/2022   TSH 1.99 01/30/2022   HGBA1C 5.9 01/30/2022    CT CARDIAC SCORING (SELF PAY ONLY)  Addendum Date: 05/12/2021   ADDENDUM REPORT: 05/12/2021 21:34 CLINICAL DATA:  Cardiovascular disease risk stratification EXAM: CT Coronary Calcium Score TECHNIQUE: A gated, non-contrast computed tomography scan of the heart was performed using 26mm slice thickness. Axial images were analyzed on a dedicated workstation. Calcium scoring of the coronary arteries was performed using the Agatston method. FINDINGS: Coronary Calcium Score: Left main: 0 Left anterior descending artery: 0 Left circumflex artery: 0 Right coronary artery: 0 Total: 0 Pericardium: Normal. Ascending Aorta: Not well visualized for measurement. Non-cardiac: See separate report from [facility]. IMPRESSION: Coronary calcium score of 0. RECOMMENDATIONS: Coronary artery calcium (CAC) score is a strong predictor of incident coronary heart disease (CHD) and provides predictive information beyond traditional risk factors. CAC scoring is reasonable to use in the decision to withhold, postpone, or initiate statin therapy in intermediate-risk or selected borderline-risk asymptomatic adults (age 69-75 years and LDL-C >=70 to <190 mg/dL) who do  not have diabetes or established atherosclerotic cardiovascular disease (ASCVD).* In intermediate-risk (10-year ASCVD risk >=7.5% to <20%) adults or selected borderline-risk (10-year ASCVD risk >=5% to <7.5%) adults in whom a CAC score is measured for the purpose of making a treatment decision the following recommendations have been made: If CAC=0, it is reasonable to withhold statin therapy and reassess in 5 to 10 years, as long as higher risk conditions are absent (diabetes mellitus, family history of premature CHD in first degree relatives (males <55 years; females <65 years), cigarette smoking, or LDL >=190 mg/dL). If  CAC is 1 to 99, it is reasonable to initiate statin therapy for patients >=28 years of age. If CAC is >=100 or >=75th percentile, it is reasonable to initiate statin therapy at any age. Cardiology referral should be considered for patients with CAC scores >=400 or >=75th percentile. *2018 AHA/ACC/AACVPR/AAPA/ABC/ACPM/ADA/AGS/APhA/ASPC/NLA/PCNA Guideline on the Management of Blood Cholesterol: A Report of the American College of Cardiology/American Heart Association Task Force on Clinical Practice Guidelines. J Am Coll Cardiol. 2019;73(24):3168-3209. Electronically Signed   By: Cherlynn Kaiser   On: 05/12/2021 21:34   Result Date: 05/12/2021 EXAM: OVER-READ INTERPRETATION  CT CHEST The following report is an over-read performed by radiologist Dr. Abigail Miyamoto of [facility], [place] on 05/12/2021. This over-read does not include interpretation of cardiac or coronary anatomy or pathology. The calcium score interpretation by the cardiologist is attached. COMPARISON:  Chest radiograph 07/30/2007 FINDINGS: Vascular: Normal aortic caliber. Mediastinum/Nodes: No imaged thoracic adenopathy. Lungs/Pleura: No pleural fluid. Lingular scarring or subsegmental atelectasis. Upper Abdomen: Normal imaged portions of the liver, spleen, stomach. Musculoskeletal: No acute osseous abnormality. Upper to  midthoracic spondylosis. IMPRESSION: No acute findings in the imaged extracardiac chest. Electronically Signed: By: Abigail Miyamoto M.D. On: 05/12/2021 16:23    Assessment & Plan:   Problem List Items Addressed This Visit     Grief    Brother died in 2023-02-28 - ?MI at 24  Seroquel as above.  Continue escitalopram      Insomnia    Chronic.  Continue with Seroquel      Vitamin D deficiency    Continue with vitamin D      Weight gain    Wt Readings from Last 3 Encounters:  01/30/22 179 lb (81.2 kg)  08/17/21 172 lb (78 kg)  07/31/21 172 lb (78 kg)  Continue with phentermine       Other Visit Diagnoses     Hyperglycemia    -  Primary   Relevant Orders   Comprehensive metabolic panel (Completed)   Hemoglobin A1c (Completed)   TSH (Completed)         Meds ordered this encounter  Medications   phentermine (ADIPEX-P) 37.5 MG tablet    Sig: Take 1 tablet (37.5 mg total) by mouth daily before breakfast.    Dispense:  30 tablet    Refill:  2      Follow-up: Return in about 6 months (around 07/30/2022) for Wellness Exam.  Walker Kehr, MD

## 2022-02-04 NOTE — Assessment & Plan Note (Signed)
Wt Readings from Last 3 Encounters:  ?01/30/22 179 lb (81.2 kg)  ?08/17/21 172 lb (78 kg)  ?07/31/21 172 lb (78 kg)  ?Continue with phentermine ? ?

## 2022-02-04 NOTE — Assessment & Plan Note (Signed)
Continue with vitamin D 

## 2022-02-04 NOTE — Assessment & Plan Note (Signed)
Chronic.  Continue with Seroquel ?

## 2022-03-05 DIAGNOSIS — Z713 Dietary counseling and surveillance: Secondary | ICD-10-CM | POA: Diagnosis not present

## 2022-04-10 ENCOUNTER — Telehealth: Payer: Self-pay | Admitting: Internal Medicine

## 2022-04-10 ENCOUNTER — Other Ambulatory Visit: Payer: Self-pay

## 2022-04-10 MED ORDER — LOSARTAN POTASSIUM 100 MG PO TABS: 100.0000 mg | ORAL_TABLET | Freq: Every day | ORAL | 2 refills | Status: DC

## 2022-04-10 MED ORDER — VITAMIN D3 50 MCG (2000 UT) PO CAPS: 2000.0000 [IU] | ORAL_CAPSULE | Freq: Every day | ORAL | 3 refills | Status: DC

## 2022-04-10 MED ORDER — HYDROCHLOROTHIAZIDE 25 MG PO TABS
25.0000 mg | ORAL_TABLET | Freq: Every day | ORAL | 2 refills | Status: DC
Start: 2022-04-10 — End: 2022-08-15

## 2022-04-10 NOTE — Telephone Encounter (Signed)
Patient is out of her Vitamin D and her high blood pressure medication:  Losartin 100 mg & hydrochlorathiazide 25 mg.  Patient is completely out of her medication.  Please send to [facility] on [address] ?

## 2022-04-16 DIAGNOSIS — Z713 Dietary counseling and surveillance: Secondary | ICD-10-CM | POA: Diagnosis not present

## 2022-04-26 NOTE — Telephone Encounter (Signed)
Meds has already been sent to pharmacy. Have a PA for pt Phentermine 37.5 mg completed PA on cover-my-meds w/ Key: [id number]). Received msg stating " Your PA request has been approved effective 03/27/2022 through 10/23/2022. Notified pt vis mychart & fax approval to pharmacy!Marland KitchenJohny Chess

## 2022-05-23 ENCOUNTER — Telehealth: Payer: Self-pay | Admitting: Internal Medicine

## 2022-05-23 NOTE — Telephone Encounter (Signed)
Caller & Relationship to patient: Gari Trovato  Call back number: [phone]  Date of last office visit: 01/30/22  Date of next office visit: 08/01/22  Medication(s) to be refilled:   Cholecalciferol (VITAMIN D3) 50 MCG (2000 UT) capsule     Preferred Pharmacy:  [facility] [address], [place] - [place] [address] Phone:  [phone]  Fax:  [phone]

## 2022-05-24 ENCOUNTER — Other Ambulatory Visit: Payer: Self-pay

## 2022-05-24 MED ORDER — VITAMIN D3 50 MCG (2000 UT) PO CAPS: 2000.0000 [IU] | ORAL_CAPSULE | Freq: Every day | ORAL | 3 refills | Status: DC

## 2022-06-13 DIAGNOSIS — Z713 Dietary counseling and surveillance: Secondary | ICD-10-CM | POA: Diagnosis not present

## 2022-08-01 ENCOUNTER — Ambulatory Visit: Payer: Federal, State, Local not specified - PPO | Admitting: Internal Medicine

## 2022-08-07 ENCOUNTER — Telehealth: Payer: Self-pay

## 2022-08-07 NOTE — Telephone Encounter (Signed)
Pt is requesting a refill on: phentermine (ADIPEX-P) 37.5 MG tablet (Expired)  Pharmacy: [facility] [address] [place] [address]  LOV 01/30/22 ROV 08/15/22

## 2022-08-10 ENCOUNTER — Other Ambulatory Visit: Payer: Self-pay

## 2022-08-10 MED ORDER — PHENTERMINE HCL 37.5 MG PO TABS: 37.5000 mg | ORAL_TABLET | Freq: Every day | ORAL | 0 refills | Status: DC

## 2022-08-11 ENCOUNTER — Other Ambulatory Visit: Payer: Self-pay | Admitting: Internal Medicine

## 2022-08-11 MED ORDER — PHENTERMINE HCL 37.5 MG PO TABS: 37.5000 mg | ORAL_TABLET | Freq: Every day | ORAL | 2 refills | Status: DC

## 2022-08-11 NOTE — Telephone Encounter (Signed)
Okay.  Thanks.

## 2022-08-11 NOTE — Addendum Note (Signed)
Addended by: Cassandria Anger on: 08/11/2022 09:10 AM   Modules accepted: Orders

## 2022-08-15 ENCOUNTER — Encounter: Payer: Self-pay | Admitting: Internal Medicine

## 2022-08-15 ENCOUNTER — Ambulatory Visit: Payer: Federal, State, Local not specified - PPO | Admitting: Internal Medicine

## 2022-08-15 VITALS — BP 132/90 | HR 65 | Temp 98.8°F | Ht 62.0 in | Wt 167.4 lb

## 2022-08-15 DIAGNOSIS — F5104 Psychophysiologic insomnia: Secondary | ICD-10-CM | POA: Diagnosis not present

## 2022-08-15 DIAGNOSIS — E559 Vitamin D deficiency, unspecified: Secondary | ICD-10-CM

## 2022-08-15 DIAGNOSIS — I10 Essential (primary) hypertension: Secondary | ICD-10-CM

## 2022-08-15 DIAGNOSIS — F418 Other specified anxiety disorders: Secondary | ICD-10-CM | POA: Diagnosis not present

## 2022-08-15 DIAGNOSIS — Z23 Encounter for immunization: Secondary | ICD-10-CM | POA: Diagnosis not present

## 2022-08-15 DIAGNOSIS — Z6835 Body mass index (BMI) 35.0-35.9, adult: Secondary | ICD-10-CM

## 2022-08-15 MED ORDER — VITAMIN D3 50 MCG (2000 UT) PO CAPS: 2000.0000 [IU] | ORAL_CAPSULE | Freq: Every day | ORAL | 3 refills | Status: AC

## 2022-08-15 MED ORDER — SAXENDA 18 MG/3ML ~~LOC~~ SOPN: 3.0000 mg | PEN_INJECTOR | Freq: Every day | SUBCUTANEOUS | 5 refills | Status: DC

## 2022-08-15 MED ORDER — LOSARTAN POTASSIUM 100 MG PO TABS: 100.0000 mg | ORAL_TABLET | Freq: Every day | ORAL | 3 refills | Status: DC

## 2022-08-15 MED ORDER — TRIAMCINOLONE ACETONIDE 0.1 % EX CREA: 1.0000 | TOPICAL_CREAM | Freq: Two times a day (BID) | CUTANEOUS | 1 refills | Status: DC

## 2022-08-15 MED ORDER — TRETINOIN 0.025 % EX CREA: TOPICAL_CREAM | Freq: Every day | CUTANEOUS | 0 refills | Status: DC

## 2022-08-15 MED ORDER — HYDROCHLOROTHIAZIDE 25 MG PO TABS: 25.0000 mg | ORAL_TABLET | Freq: Every day | ORAL | 3 refills | Status: DC

## 2022-08-15 MED ORDER — QUETIAPINE FUMARATE 100 MG PO TABS: 100.0000 mg | ORAL_TABLET | Freq: Every day | ORAL | 1 refills | Status: DC

## 2022-08-15 MED ORDER — ESCITALOPRAM OXALATE 5 MG PO TABS: 5.0000 mg | ORAL_TABLET | Freq: Every day | ORAL | 1 refills | Status: AC

## 2022-08-15 NOTE — Progress Notes (Signed)
Subjective:  Patient ID: Sydney [name], female    DOB: December 15, 1965  Age: 56 y.o. MRN: [id number]  CC: Follow-up (6 month f/u- Flu shot)   HPI Freddi Forster presents for anxiety, HTN, insomnia  Outpatient Medications Prior to Visit  Medication Sig Dispense Refill   ALPRAZolam (XANAX) 0.25 MG tablet Take 1-2 tablets (0.25-0.5 mg total) by mouth at bedtime as needed for anxiety or sleep. 60 tablet 1   finasteride (PROSCAR) 5 MG tablet 1/4 tab daily for hair loss 30 tablet 3   phentermine (ADIPEX-P) 37.5 MG tablet Take 1 tablet (37.5 mg total) by mouth daily before breakfast. 30 tablet 2   Cholecalciferol (VITAMIN D3) 50 MCG (2000 UT) capsule Take 1 capsule (2,000 Units total) by mouth daily. 100 capsule 3   escitalopram (LEXAPRO) 5 MG tablet Take 1 tablet (5 mg total) by mouth daily. 30 tablet 5   hydrochlorothiazide (HYDRODIURIL) 25 MG tablet Take 1 tablet (25 mg total) by mouth daily. 90 tablet 2   Liraglutide -Weight Management (SAXENDA) 18 MG/3ML SOPN Inject 3 mg into the skin daily. 3 mL 5   losartan (COZAAR) 100 MG tablet Take 1 tablet (100 mg total) by mouth daily. 90 tablet 2   QUEtiapine (SEROQUEL) 100 MG tablet Take 1 tablet (100 mg total) by mouth at bedtime. For insomnia 30 tablet 5   triamcinolone cream (KENALOG) 0.1 % Apply 1 application topically 2 (two) times daily. 80 g 1   No facility-administered medications prior to visit.    ROS: Review of Systems  Constitutional:  Negative for activity change, appetite change, chills, fatigue and unexpected weight change.  HENT:  Negative for congestion, mouth sores and sinus pressure.   Eyes:  Negative for visual disturbance.  Respiratory:  Negative for cough and chest tightness.   Gastrointestinal:  Negative for abdominal pain, nausea and vomiting.  Genitourinary:  Negative for difficulty urinating, frequency and vaginal pain.  Musculoskeletal:  Negative for back pain and gait problem.  Skin:  Negative for pallor and rash.   Neurological:  Negative for dizziness, tremors, weakness, numbness and headaches.  Psychiatric/Behavioral:  Positive for dysphoric mood. Negative for confusion, sleep disturbance and suicidal ideas. The patient is nervous/anxious.     Objective:  BP (!) 132/90 (BP Location: Left Arm)   Pulse 65   Temp 98.8 F (37.1 C) (Oral)   Ht '5\' 2"'$  (1.575 m)   Wt 167 lb 6.4 oz (75.9 kg)   LMP 05/04/2012   SpO2 96%   BMI 30.62 kg/m   BP Readings from Last 3 Encounters:  08/15/22 (!) 132/90  01/30/22 116/80  08/17/21 112/80    Wt Readings from Last 3 Encounters:  08/15/22 167 lb 6.4 oz (75.9 kg)  01/30/22 179 lb (81.2 kg)  08/17/21 172 lb (78 kg)    Physical Exam Constitutional:      General: She is not in acute distress.    Appearance: She is well-developed.  HENT:     Head: Normocephalic.     Right Ear: External ear normal.     Left Ear: External ear normal.     Nose: Nose normal.  Eyes:     General:        Right eye: No discharge.        Left eye: No discharge.     Conjunctiva/sclera: Conjunctivae normal.     Pupils: Pupils are equal, round, and reactive to light.  Neck:     Thyroid: No thyromegaly.     Vascular:  No JVD.     Trachea: No tracheal deviation.  Cardiovascular:     Rate and Rhythm: Normal rate and regular rhythm.     Heart sounds: Normal heart sounds.  Pulmonary:     Effort: No respiratory distress.     Breath sounds: No stridor. No wheezing.  Abdominal:     General: Bowel sounds are normal. There is no distension.     Palpations: Abdomen is soft. There is no mass.     Tenderness: There is no abdominal tenderness. There is no guarding or rebound.  Musculoskeletal:        General: No tenderness.     Cervical back: Normal range of motion and neck supple. No rigidity.  Lymphadenopathy:     Cervical: No cervical adenopathy.  Skin:    Findings: No erythema or rash.  Neurological:     Cranial Nerves: No cranial nerve deficit.     Motor: No abnormal muscle  tone.     Coordination: Coordination normal.     Deep Tendon Reflexes: Reflexes normal.  Psychiatric:        Behavior: Behavior normal.        Thought Content: Thought content normal.        Judgment: Judgment normal.     Lab Results  Component Value Date   WBC 3.9 (L) 08/24/2022   HGB 13.5 08/24/2022   HCT 40.3 08/24/2022   PLT 282.0 08/24/2022   GLUCOSE 92 08/24/2022   CHOL 186 01/26/2021   TRIG 211.0 (H) 01/26/2021   HDL 61.80 01/26/2021   LDLDIRECT 97.0 01/26/2021   LDLCALC 67 03/24/2018   ALT 12 08/24/2022   AST 12 08/24/2022   NA 139 08/24/2022   K 3.7 08/24/2022   CL 100 08/24/2022   CREATININE 0.85 08/24/2022   BUN 21 08/24/2022   CO2 31 08/24/2022   TSH 2.25 08/24/2022   HGBA1C 5.9 01/30/2022    CT CARDIAC SCORING (SELF PAY ONLY)  Addendum Date: 05/12/2021   ADDENDUM REPORT: 05/12/2021 21:34 CLINICAL DATA:  Cardiovascular disease risk stratification EXAM: CT Coronary Calcium Score TECHNIQUE: A gated, non-contrast computed tomography scan of the heart was performed using 22m slice thickness. Axial images were analyzed on a dedicated workstation. Calcium scoring of the coronary arteries was performed using the Agatston method. FINDINGS: Coronary Calcium Score: Left main: 0 Left anterior descending artery: 0 Left circumflex artery: 0 Right coronary artery: 0 Total: 0 Pericardium: Normal. Ascending Aorta: Not well visualized for measurement. Non-cardiac: [facility]. IMPRESSION: Coronary calcium score of 0. RECOMMENDATIONS: Coronary artery calcium (CAC) score is a strong predictor of incident coronary heart disease (CHD) and provides predictive information beyond traditional risk factors. CAC scoring is reasonable to use in the decision to withhold, postpone, or initiate statin therapy in intermediate-risk or selected borderline-risk asymptomatic adults (age 501-75years and LDL-C >=70 to <190 mg/dL) who do not have diabetes or established  atherosclerotic cardiovascular disease (ASCVD).* In intermediate-risk (10-year ASCVD risk >=7.5% to <20%) adults or selected borderline-risk (10-year ASCVD risk >=5% to <7.5%) adults in whom a CAC score is measured for the purpose of making a treatment decision the following recommendations have been made: If CAC=0, it is reasonable to withhold statin therapy and reassess in 5 to 10 years, as long as higher risk conditions are absent (diabetes mellitus, family history of premature CHD in first degree relatives (males <55 years; females <65 years), cigarette smoking, or LDL >=190 mg/dL). If CAC is 1 to 99, it  is reasonable to initiate statin therapy for patients >=53 years of age. If CAC is >=100 or >=75th percentile, it is reasonable to initiate statin therapy at any age. Cardiology referral should be considered for patients with CAC scores >=400 or >=75th percentile. *2018 AHA/ACC/AACVPR/AAPA/ABC/ACPM/ADA/AGS/APhA/ASPC/NLA/PCNA Guideline on the Management of Blood Cholesterol: A Report of the American College of Cardiology/American Heart Association Task Force on Clinical Practice Guidelines. J Am Coll Cardiol. 2019;73(24):3168-3209. Electronically Signed   By: Cherlynn Kaiser   On: 05/12/2021 21:34   Result Date: 05/12/2021 EXAM: OVER-READ INTERPRETATION  CT CHEST The following report is an over-read performed by radiologist Dr. Abigail Miyamoto of [facility], [place] on 05/12/2021. This over-read does not include interpretation of cardiac or coronary anatomy or pathology. The calcium score interpretation by the cardiologist is attached. COMPARISON:  Chest radiograph 07/30/2007 FINDINGS: Vascular: Normal aortic caliber. Mediastinum/Nodes: No imaged thoracic adenopathy. Lungs/Pleura: No pleural fluid. Lingular scarring or subsegmental atelectasis. Upper Abdomen: Normal imaged portions of the liver, spleen, stomach. Musculoskeletal: No acute osseous abnormality. Upper to midthoracic spondylosis. IMPRESSION: No  acute findings in the imaged extracardiac chest. Electronically Signed: By: Abigail Miyamoto M.D. On: 05/12/2021 16:23    Assessment & Plan:   Problem List Items Addressed This Visit     Essential hypertension   Relevant Medications   hydrochlorothiazide (HYDRODIURIL) 25 MG tablet   losartan (COZAAR) 100 MG tablet   Other Relevant Orders   TSH (Completed)   Urinalysis (Completed)   CBC with Differential/Platelet (Completed)   Comprehensive metabolic panel (Completed)   Insomnia - Primary    Continue on Lexapro and Seroquel      Relevant Orders   TSH (Completed)   Urinalysis (Completed)   CBC with Differential/Platelet (Completed)   Comprehensive metabolic panel (Completed)   Obesity    Wt Readings from Last 3 Encounters:  08/15/22 167 lb 6.4 oz (75.9 kg)  01/30/22 179 lb (81.2 kg)  08/17/21 172 lb (78 kg)  On diet      Relevant Medications   Liraglutide -Weight Management (SAXENDA) 18 MG/3ML SOPN   Situational anxiety    Continue on Lexapro and Seroquel      Relevant Medications   escitalopram (LEXAPRO) 5 MG tablet   Vitamin D deficiency    Continue with vitamin D      Relevant Orders   TSH (Completed)   Urinalysis (Completed)   CBC with Differential/Platelet (Completed)   Comprehensive metabolic panel (Completed)      Meds ordered this encounter  Medications   Liraglutide -Weight Management (SAXENDA) 18 MG/3ML SOPN    Sig: Inject 3 mg into the skin daily.    Dispense:  3 mL    Refill:  5    BMI 30, HTN   hydrochlorothiazide (HYDRODIURIL) 25 MG tablet    Sig: Take 1 tablet (25 mg total) by mouth daily.    Dispense:  90 tablet    Refill:  3   escitalopram (LEXAPRO) 5 MG tablet    Sig: Take 1 tablet (5 mg total) by mouth daily.    Dispense:  90 tablet    Refill:  1   losartan (COZAAR) 100 MG tablet    Sig: Take 1 tablet (100 mg total) by mouth daily.    Dispense:  90 tablet    Refill:  3   QUEtiapine (SEROQUEL) 100 MG tablet    Sig: Take 1 tablet  (100 mg total) by mouth at bedtime. For insomnia    Dispense:  90 tablet  Refill:  1   Cholecalciferol (VITAMIN D3) 50 MCG (2000 UT) capsule    Sig: Take 1 capsule (2,000 Units total) by mouth daily.    Dispense:  100 capsule    Refill:  3   triamcinolone cream (KENALOG) 0.1 %    Sig: Apply 1 Application topically 2 (two) times daily.    Dispense:  80 g    Refill:  1   tretinoin (RETIN-A) 0.025 % cream    Sig: Apply topically at bedtime.    Dispense:  45 g    Refill:  0      Follow-up: Return in about 4 months (around 12/15/2022) for a follow-up visit.  Walker Kehr, MD

## 2022-08-15 NOTE — Patient Instructions (Signed)
Blue-Emu cream -- use 2-3 times a day ? ?

## 2022-08-17 ENCOUNTER — Telehealth: Payer: Self-pay | Admitting: *Deleted

## 2022-08-17 NOTE — Telephone Encounter (Signed)
Rec'd fax PA for Saxenda '18mg'$ /49m submitted via cover-my-meds w/ (Key: [id number] Rec'd msg Your information has been submitted to [facility].Marland KitchenJohny Chess

## 2022-08-20 NOTE — Telephone Encounter (Signed)
Rec'd determination fax med was APPROVED. Effective  07/18/2022 through 02/13/2023. Faxed to [facility].Marland KitchenJohny Chess

## 2022-08-24 ENCOUNTER — Other Ambulatory Visit (INDEPENDENT_AMBULATORY_CARE_PROVIDER_SITE_OTHER): Payer: Federal, State, Local not specified - PPO

## 2022-08-24 DIAGNOSIS — F5104 Psychophysiologic insomnia: Secondary | ICD-10-CM | POA: Diagnosis not present

## 2022-08-24 DIAGNOSIS — I10 Essential (primary) hypertension: Secondary | ICD-10-CM

## 2022-08-24 DIAGNOSIS — E559 Vitamin D deficiency, unspecified: Secondary | ICD-10-CM

## 2022-08-24 LAB — COMPREHENSIVE METABOLIC PANEL
ALT: 12 U/L (ref 0–35)
AST: 12 U/L (ref 0–37)
Albumin: 4.1 g/dL (ref 3.5–5.2)
Alkaline Phosphatase: 71 U/L (ref 39–117)
BUN: 21 mg/dL (ref 6–23)
CO2: 31 mEq/L (ref 19–32)
Calcium: 9.6 mg/dL (ref 8.4–10.5)
Chloride: 100 mEq/L (ref 96–112)
Creatinine, Ser: 0.85 mg/dL (ref 0.40–1.20)
GFR: 77.8 mL/min (ref >60.00)
Glucose, Bld: 92 mg/dL (ref 70–99)
Potassium: 3.7 mEq/L (ref 3.5–5.1)
Sodium: 139 mEq/L (ref 135–145)
Total Bilirubin: 0.4 mg/dL (ref 0.2–1.2)
Total Protein: 7.6 g/dL (ref 6.0–8.3)

## 2022-08-24 LAB — CBC WITH DIFFERENTIAL/PLATELET
Basophils Absolute: 0 10*3/uL (ref 0.0–0.1)
Basophils Relative: 0.4 % (ref 0.0–3.0)
Eosinophils Absolute: 0.1 10*3/uL (ref 0.0–0.7)
Eosinophils Relative: 2.2 % (ref 0.0–5.0)
HCT: 40.3 % (ref 36.0–46.0)
Hemoglobin: 13.5 g/dL (ref 12.0–15.0)
Lymphocytes Relative: 48.1 % — ABNORMAL HIGH (ref 12.0–46.0)
Lymphs Abs: 1.9 10*3/uL (ref 0.7–4.0)
MCHC: 33.5 g/dL (ref 30.0–36.0)
MCV: 85.2 fl (ref 78.0–100.0)
Monocytes Absolute: 0.4 10*3/uL (ref 0.1–1.0)
Monocytes Relative: 10.4 % (ref 3.0–12.0)
Neutro Abs: 1.5 10*3/uL (ref 1.4–7.7)
Neutrophils Relative %: 38.9 % — ABNORMAL LOW (ref 43.0–77.0)
Platelets: 282 10*3/uL (ref 150.0–400.0)
RBC: 4.73 Mil/uL (ref 3.87–5.11)
RDW: 13.3 % (ref 11.5–15.5)
WBC: 3.9 10*3/uL — ABNORMAL LOW (ref 4.0–10.5)

## 2022-08-24 LAB — URINALYSIS
Bilirubin Urine: NEGATIVE
Hgb urine dipstick: NEGATIVE
Ketones, ur: NEGATIVE
Leukocytes,Ua: NEGATIVE
Nitrite: NEGATIVE
Specific Gravity, Urine: 1.025 (ref 1.000–1.030)
Total Protein, Urine: NEGATIVE
Urine Glucose: NEGATIVE
Urobilinogen, UA: 0.2 (ref 0.0–1.0)
pH: 5.5 (ref 5.0–8.0)

## 2022-08-24 LAB — TSH: TSH: 2.25 u[IU]/mL (ref 0.35–5.50)

## 2022-08-30 ENCOUNTER — Ambulatory Visit (INDEPENDENT_AMBULATORY_CARE_PROVIDER_SITE_OTHER): Payer: Federal, State, Local not specified - PPO

## 2022-08-30 DIAGNOSIS — Z23 Encounter for immunization: Secondary | ICD-10-CM

## 2022-08-31 ENCOUNTER — Encounter: Payer: Self-pay | Admitting: Internal Medicine

## 2022-09-02 NOTE — Assessment & Plan Note (Signed)
Continue with vitamin D 

## 2022-09-02 NOTE — Assessment & Plan Note (Signed)
Continue on Lexapro and Seroquel

## 2022-09-02 NOTE — Assessment & Plan Note (Signed)
Wt Readings from Last 3 Encounters:  08/15/22 167 lb 6.4 oz (75.9 kg)  01/30/22 179 lb (81.2 kg)  08/17/21 172 lb (78 kg)  On diet

## 2022-09-05 ENCOUNTER — Encounter: Payer: Self-pay | Admitting: Internal Medicine

## 2022-09-06 DIAGNOSIS — Z91013 Allergy to seafood: Secondary | ICD-10-CM | POA: Diagnosis not present

## 2022-09-06 DIAGNOSIS — J3089 Other allergic rhinitis: Secondary | ICD-10-CM | POA: Diagnosis not present

## 2022-09-14 DIAGNOSIS — Z713 Dietary counseling and surveillance: Secondary | ICD-10-CM | POA: Diagnosis not present

## 2022-09-24 DIAGNOSIS — Z1231 Encounter for screening mammogram for malignant neoplasm of breast: Secondary | ICD-10-CM | POA: Diagnosis not present

## 2022-09-24 DIAGNOSIS — R87612 Low grade squamous intraepithelial lesion on cytologic smear of cervix (LGSIL): Secondary | ICD-10-CM | POA: Diagnosis not present

## 2022-09-24 DIAGNOSIS — Z01419 Encounter for gynecological examination (general) (routine) without abnormal findings: Secondary | ICD-10-CM | POA: Diagnosis not present

## 2022-10-24 ENCOUNTER — Telehealth: Payer: Self-pay | Admitting: Internal Medicine

## 2022-10-24 NOTE — Telephone Encounter (Signed)
Patient called to follow up on phentermine (ADIPEX-P) 37.5 MG tablet, she said the pharmacy said they were waiting on a PA. She wanted to see if we can follow up on it. Call back [phone]

## 2022-10-29 NOTE — Telephone Encounter (Signed)
Med was approve on Thursday. Called pt she states she pick up phentermine on yesterday. Pharmacy still don't have [place].Marland KitchenJohny Chess

## 2022-11-08 ENCOUNTER — Ambulatory Visit (INDEPENDENT_AMBULATORY_CARE_PROVIDER_SITE_OTHER): Payer: Federal, State, Local not specified - PPO

## 2022-11-08 DIAGNOSIS — Z23 Encounter for immunization: Secondary | ICD-10-CM

## 2022-11-08 NOTE — Progress Notes (Cosign Needed Addendum)
After obtaining consent, and per orders of Dr. Alain Marion, injection of Shingrix was given in the left deltoid by Marrian Salvage. Patient tolerated well and  instructed to report any adverse reaction to me immediately.  Medical screening examination/treatment/procedure(s) were performed by non-physician practitioner and as supervising physician I was immediately available for consultation/collaboration.  I agree with above. Lew Dawes, MD

## 2022-11-28 NOTE — Progress Notes (Signed)
[place] Phone: [phone] Subjective:    I'm seeing this patient by the request  of:  Plotnikov, Evie Lacks, MD  CC: Hand pain  IRW:[id number]  Dvora Buitron is a 56 y.o. female coming in with complaint of R hand pain. Last seen in 2022 for OMT. Patient states that she is always tender in her palm, just wants a re check she can only feel pain when she presses on it        Past Medical History:  Diagnosis Date   Hypertension    No past surgical history on file. Social History   Socioeconomic History   Marital status: Single    Spouse name: Not on file   Number of children: Not on file   Years of education: Not on file   Highest education level: Not on file  Occupational History   Not on file  Tobacco Use   Smoking status: Never   Smokeless tobacco: Never  Substance and Sexual Activity   Alcohol use: No   Drug use: No   Sexual activity: Not on file  Other Topics Concern   Not on file  Social History Narrative   Not on file   Social Determinants of Health   Financial Resource Strain: Not on file  Food Insecurity: Not on file  Transportation Needs: Not on file  Physical Activity: Not on file  Stress: Not on file  Social Connections: Not on file   Allergies  Allergen Reactions   Shellfish Allergy Hives    Facial urticaria   Enalapril Maleate     REACTION: cough   Family History  Problem Relation Age of Onset   Heart disease Brother 79       Brother died in 03/17/2023 - ?MI at 38     Current Outpatient Medications (Cardiovascular):    hydrochlorothiazide (HYDRODIURIL) 25 MG tablet, Take 1 tablet (25 mg total) by mouth daily.   losartan (COZAAR) 100 MG tablet, Take 1 tablet (100 mg total) by mouth daily.     Current Outpatient Medications (Other):    ALPRAZolam (XANAX) 0.25 MG tablet, Take 1-2 tablets (0.25-0.5 mg total) by mouth at bedtime as needed for anxiety or sleep.    Cholecalciferol (VITAMIN D3) 50 MCG (2000 UT) capsule, Take 1 capsule (2,000 Units total) by mouth daily.   escitalopram (LEXAPRO) 5 MG tablet, Take 1 tablet (5 mg total) by mouth daily.   finasteride (PROSCAR) 5 MG tablet, 1/4 tab daily for hair loss   Liraglutide -Weight Management (SAXENDA) 18 MG/3ML SOPN, Inject 3 mg into the skin daily.   QUEtiapine (SEROQUEL) 100 MG tablet, Take 1 tablet (100 mg total) by mouth at bedtime. For insomnia   tretinoin (RETIN-A) 0.025 % cream, Apply topically at bedtime.   triamcinolone cream (KENALOG) 0.1 %, Apply 1 Application topically 2 (two) times daily.   phentermine (ADIPEX-P) 37.5 MG tablet, Take 1 tablet (37.5 mg total) by mouth daily before breakfast.   Reviewed prior external information including notes and imaging from  primary care provider As well as notes that were available from care everywhere and other healthcare systems.  Past medical history, social, surgical and family history all reviewed in electronic medical record.  No pertanent information unless stated regarding to the chief complaint.   Review of Systems:  No headache, visual changes, nausea, vomiting, diarrhea, constipation, dizziness, abdominal pain, skin rash, fevers, chills, night sweats, weight loss, swollen lymph nodes, body  aches, joint swelling, chest pain, shortness of breath, mood changes. POSITIVE muscle aches  Objective  Blood pressure 120/78, pulse 91, height '5\' 2"'$  (1.575 m), weight 162 lb (73.5 kg), last menstrual period 05/04/2012, SpO2 98 %.   General: No apparent distress alert and oriented x3 mood and affect normal, dressed appropriately.  HEENT: Pupils equal, extraocular movements intact  Respiratory: Patient's speak in full sentences and does not appear short of breath  Cardiovascular: No lower extremity edema, non tender, no erythema  Hand exam shows patient does have relatively good range of motion.  Mild positive Tinel's.  Very mild vague discomfort over  the [facility] joint with mild positive grind test.  Right greater than left.  Good grip strength.  Neurovascularly intact distally.  Limited muscular skeletal ultrasound was performed and interpreted by Hulan Saas, M  Limited ultrasound shows patient does have significant enlargement of the c median nerve with hypoechoic changes noted.  Contralateral side has some mild enlargement of the median nerve.  CMC joints mild arthritic changes but nothing severe. Impression: Carpal tunnel syndrome right greater than left with mild CMC arthritis    Impression and Recommendations:     The above documentation has been reviewed and is accurate and complete Lyndal Pulley, DO

## 2022-11-30 ENCOUNTER — Ambulatory Visit: Payer: Federal, State, Local not specified - PPO | Admitting: Family Medicine

## 2022-11-30 ENCOUNTER — Ambulatory Visit: Payer: Self-pay

## 2022-11-30 VITALS — BP 120/78 | HR 91 | Ht 62.0 in | Wt 162.0 lb

## 2022-11-30 DIAGNOSIS — G5601 Carpal tunnel syndrome, right upper limb: Secondary | ICD-10-CM

## 2022-11-30 DIAGNOSIS — M19049 Primary osteoarthritis, unspecified hand: Secondary | ICD-10-CM

## 2022-11-30 DIAGNOSIS — M79641 Pain in right hand: Secondary | ICD-10-CM | POA: Diagnosis not present

## 2022-11-30 NOTE — Patient Instructions (Signed)
Wrist brace day and night for 2 week  Nightly for 2 weeks  Need to wear wrist brace at work for the next 2 weeks  Needs ergonomic key board and mouse for work  6 week follow up

## 2022-12-01 ENCOUNTER — Encounter: Payer: Self-pay | Admitting: Family Medicine

## 2022-12-01 DIAGNOSIS — G5600 Carpal tunnel syndrome, unspecified upper limb: Secondary | ICD-10-CM | POA: Insufficient documentation

## 2022-12-01 NOTE — Assessment & Plan Note (Addendum)
We discussed the anatomy involved, and that carpal tunnel syndrome primarily involves the median nerve, and this typically affects digits one through 3.   We also discussed that mild cases of carpal tunnel syndrome are often improved with night splints which was given today.  If symptoms persist it is very reasonable to consider a carpal tunnel injection.   If the patient does have moderate to severe carpal tunnel syndrome based on NCV, then it is certainly reasonable to consider surgical consultation for definitive management possible carpal tunnel release.  We also discussed his severe carpal tunnel syndrome can lead to permanent nerve impairment even if released. At this point, the patient like to proceed conservatively.

## 2022-12-01 NOTE — Assessment & Plan Note (Signed)
Known arthritis for 4 years but very minimal progression.  Nothing that is likely contributing to patient's symptomatology at this time.  Follow-up with me again after conservative therapy of bracing for the carpal tunnel and 4 to 6 weeks.  Otherwise would need to consider injections for diagnostic and therapeutic purposes if continuing to have hand pain

## 2022-12-14 DIAGNOSIS — Z713 Dietary counseling and surveillance: Secondary | ICD-10-CM | POA: Diagnosis not present

## 2022-12-17 ENCOUNTER — Ambulatory Visit: Payer: Federal, State, Local not specified - PPO | Admitting: Internal Medicine

## 2022-12-20 ENCOUNTER — Ambulatory Visit: Payer: Federal, State, Local not specified - PPO | Admitting: Internal Medicine

## 2022-12-27 ENCOUNTER — Ambulatory Visit: Payer: Federal, State, Local not specified - PPO | Admitting: Internal Medicine

## 2022-12-27 ENCOUNTER — Encounter: Payer: Self-pay | Admitting: Internal Medicine

## 2022-12-27 VITALS — BP 118/80 | HR 65 | Temp 98.2°F | Ht 62.0 in | Wt 170.0 lb

## 2022-12-27 DIAGNOSIS — D649 Anemia, unspecified: Secondary | ICD-10-CM | POA: Insufficient documentation

## 2022-12-27 DIAGNOSIS — G4700 Insomnia, unspecified: Secondary | ICD-10-CM

## 2022-12-27 DIAGNOSIS — I10 Essential (primary) hypertension: Secondary | ICD-10-CM | POA: Diagnosis not present

## 2022-12-27 DIAGNOSIS — F418 Other specified anxiety disorders: Secondary | ICD-10-CM

## 2022-12-27 DIAGNOSIS — Z91013 Allergy to seafood: Secondary | ICD-10-CM | POA: Insufficient documentation

## 2022-12-27 NOTE — Progress Notes (Signed)
Subjective:  Patient ID: Sydney [name], female    DOB: [birth year]  Age: 57 y.o. MRN: [id number]  CC: No chief complaint on file.   HPI Sydney [name] presents for insomnia, stress, HTN follow-up   Outpatient Medications Prior to Visit  Medication Sig Dispense Refill   Cholecalciferol (VITAMIN D3) 50 MCG (2000 UT) capsule Take 1 capsule (2,000 Units total) by mouth daily. 100 capsule 3   escitalopram (LEXAPRO) 5 MG tablet Take 1 tablet (5 mg total) by mouth daily. 90 tablet 1   finasteride (PROSCAR) 5 MG tablet 1/4 tab daily for hair loss 30 tablet 3   hydrochlorothiazide (HYDRODIURIL) 25 MG tablet Take 1 tablet (25 mg total) by mouth daily. 90 tablet 3   losartan (COZAAR) 100 MG tablet Take 1 tablet (100 mg total) by mouth daily. 90 tablet 3   QUEtiapine (SEROQUEL) 100 MG tablet Take 1 tablet (100 mg total) by mouth at bedtime. For insomnia 90 tablet 1   tretinoin (RETIN-A) 0.025 % cream Apply topically at bedtime. 45 g 0   triamcinolone cream (KENALOG) 0.1 % Apply 1 Application topically 2 (two) times daily. 80 g 1   ALPRAZolam (XANAX) 0.25 MG tablet Take 1-2 tablets (0.25-0.5 mg total) by mouth at bedtime as needed for anxiety or sleep. 60 tablet 1   Liraglutide -Weight Management (SAXENDA) 18 MG/3ML SOPN Inject 3 mg into the skin daily. 3 mL 5   phentermine (ADIPEX-P) 37.5 MG tablet Take 1 tablet (37.5 mg total) by mouth daily before breakfast. 30 tablet 2   No facility-administered medications prior to visit.    ROS: Review of Systems  Constitutional:  Positive for fatigue. Negative for activity change, appetite change, chills and unexpected weight change.  HENT:  Negative for congestion, mouth sores and sinus pressure.   Eyes:  Negative for visual disturbance.  Respiratory:  Negative for cough and chest tightness.   Gastrointestinal:  Negative for abdominal pain and nausea.  Genitourinary:  Negative for difficulty urinating, frequency and vaginal pain.  Musculoskeletal:   Negative for back pain and gait problem.  Skin:  Negative for pallor and rash.  Neurological:  Negative for dizziness, tremors, weakness, numbness and headaches.  Psychiatric/Behavioral:  Positive for sleep disturbance. Negative for confusion and suicidal ideas. The patient is nervous/anxious.     Objective:  BP 118/80 (BP Location: Left Arm, Patient Position: Sitting, Cuff Size: Normal)   Pulse 65   Temp 98.2 F (36.8 C) (Oral)   Ht '5\' 2"'$  (1.575 m)   Wt 170 lb (77.1 kg)   LMP 05/04/2012   SpO2 98%   BMI 31.09 kg/m   BP Readings from Last 3 Encounters:  12/27/22 118/80  11/30/22 120/78  08/15/22 (!) 132/90    Wt Readings from Last 3 Encounters:  12/27/22 170 lb (77.1 kg)  11/30/22 162 lb (73.5 kg)  08/15/22 167 lb 6.4 oz (75.9 kg)    Physical Exam Constitutional:      General: She is not in acute distress.    Appearance: She is well-developed.  HENT:     Head: Normocephalic.     Right Ear: External ear normal.     Left Ear: External ear normal.     Nose: Nose normal.  Eyes:     General:        Right eye: No discharge.        Left eye: No discharge.     Conjunctiva/sclera: Conjunctivae normal.     Pupils: Pupils are equal, round, and  reactive to light.  Neck:     Thyroid: No thyromegaly.     Vascular: No JVD.     Trachea: No tracheal deviation.  Cardiovascular:     Rate and Rhythm: Normal rate and regular rhythm.     Heart sounds: Normal heart sounds.  Pulmonary:     Effort: No respiratory distress.     Breath sounds: No stridor. No wheezing.  Abdominal:     General: Bowel sounds are normal. There is no distension.     Palpations: Abdomen is soft. There is no mass.     Tenderness: There is no abdominal tenderness. There is no guarding or rebound.  Musculoskeletal:        General: No tenderness.     Cervical back: Normal range of motion and neck supple. No rigidity.  Lymphadenopathy:     Cervical: No cervical adenopathy.  Skin:    Findings: No erythema  or rash.  Neurological:     Cranial Nerves: No cranial nerve deficit.     Motor: No abnormal muscle tone.     Coordination: Coordination normal.     Deep Tendon Reflexes: Reflexes normal.  Psychiatric:        Behavior: Behavior normal.        Thought Content: Thought content normal.        Judgment: Judgment normal.     Lab Results  Component Value Date   WBC 3.9 (L) 08/24/2022   HGB 13.5 08/24/2022   HCT 40.3 08/24/2022   PLT 282.0 08/24/2022   GLUCOSE 92 08/24/2022   CHOL 186 01/26/2021   TRIG 211.0 (H) 01/26/2021   HDL 61.80 01/26/2021   LDLDIRECT 97.0 01/26/2021   LDLCALC 67 03/24/2018   ALT 12 08/24/2022   AST 12 08/24/2022   NA 139 08/24/2022   K 3.7 08/24/2022   CL 100 08/24/2022   CREATININE 0.85 08/24/2022   BUN 21 08/24/2022   CO2 31 08/24/2022   TSH 2.25 08/24/2022   HGBA1C 5.9 01/30/2022    CT CARDIAC SCORING (SELF PAY ONLY)  Addendum Date: 05/12/2021   ADDENDUM REPORT: 05/12/2021 21:34 CLINICAL DATA:  Cardiovascular disease risk stratification EXAM: CT Coronary Calcium Score TECHNIQUE: A gated, non-contrast computed tomography scan of the heart was performed using 61m slice thickness. Axial images were analyzed on a dedicated workstation. Calcium scoring of the coronary arteries was performed using the Agatston method. FINDINGS: Coronary Calcium Score: Left main: 0 Left anterior descending artery: 0 Left circumflex artery: 0 Right coronary artery: 0 Total: 0 Pericardium: Normal. Ascending Aorta: Not well visualized for measurement. Non-cardiac: [facility]. IMPRESSION: Coronary calcium score of 0. RECOMMENDATIONS: Coronary artery calcium (CAC) score is a strong predictor of incident coronary heart disease (CHD) and provides predictive information beyond traditional risk factors. CAC scoring is reasonable to use in the decision to withhold, postpone, or initiate statin therapy in intermediate-risk or selected borderline-risk  asymptomatic adults (age 55-75years and LDL-C >=70 to <190 mg/dL) who do not have diabetes or established atherosclerotic cardiovascular disease (ASCVD).* In intermediate-risk (10-year ASCVD risk >=7.5% to <20%) adults or selected borderline-risk (10-year ASCVD risk >=5% to <7.5%) adults in whom a CAC score is measured for the purpose of making a treatment decision the following recommendations have been made: If CAC=0, it is reasonable to withhold statin therapy and reassess in 5 to 10 years, as long as higher risk conditions are absent (diabetes mellitus, family history of premature CHD in first degree relatives (males <55  years; females <65 years), cigarette smoking, or LDL >=190 mg/dL). If CAC is 1 to 99, it is reasonable to initiate statin therapy for patients >=45 years of age. If CAC is >=100 or >=75th percentile, it is reasonable to initiate statin therapy at any age. Cardiology referral should be considered for patients with CAC scores >=400 or >=75th percentile. *2018 AHA/ACC/AACVPR/AAPA/ABC/ACPM/ADA/AGS/APhA/ASPC/NLA/PCNA Guideline on the Management of Blood Cholesterol: A Report of the American College of Cardiology/American Heart Association Task Force on Clinical Practice Guidelines. J Am Coll Cardiol. 2019;73(24):3168-3209. Electronically Signed   By: Cherlynn Kaiser   On: 05/12/2021 21:34   Result Date: 05/12/2021 EXAM: OVER-READ INTERPRETATION  CT CHEST The following report is an over-read performed by radiologist Dr. Abigail Miyamoto of [facility], [place] on 05/12/2021. This over-read does not include interpretation of cardiac or coronary anatomy or pathology. The calcium score interpretation by the cardiologist is attached. COMPARISON:  Chest radiograph 07/30/2007 FINDINGS: Vascular: Normal aortic caliber. Mediastinum/Nodes: No imaged thoracic adenopathy. Lungs/Pleura: No pleural fluid. Lingular scarring or subsegmental atelectasis. Upper Abdomen: Normal imaged portions of the liver, spleen,  stomach. Musculoskeletal: No acute osseous abnormality. Upper to midthoracic spondylosis. IMPRESSION: No acute findings in the imaged extracardiac chest. Electronically Signed: By: Abigail Miyamoto M.D. On: 05/12/2021 16:23    Assessment & Plan:   Problem List Items Addressed This Visit       Cardiovascular and Mediastinum   Essential hypertension    Continue current therapy.  On losartan and HCTZ        Other   Insomnia - Primary    Ongoing problem.  Multiple medications were tried without much relief.      Situational anxiety    Chronic.  Discussed.         No orders of the defined types were placed in this encounter.     Follow-up: Return in about 4 months (around 04/27/2023).  Walker Kehr, MD

## 2022-12-30 NOTE — Assessment & Plan Note (Signed)
Chronic.  Discussed.

## 2022-12-30 NOTE — Assessment & Plan Note (Signed)
Ongoing problem.  Multiple medications were tried without much relief.

## 2022-12-30 NOTE — Assessment & Plan Note (Signed)
Continue current therapy.  On losartan and HCTZ

## 2022-12-31 ENCOUNTER — Telehealth: Payer: Self-pay | Admitting: *Deleted

## 2022-12-31 NOTE — Telephone Encounter (Signed)
Tried calling pt to inform her FMLA form, is ready for pick. No answer and can't LMOM due to VM being full. Sent pt a [employer] as well../l;mb

## 2023-01-18 NOTE — Telephone Encounter (Signed)
Place form on MD desk.Marland KitchenJohny Chess

## 2023-01-18 NOTE — Telephone Encounter (Signed)
Patient has brought back the paperwork, states they will not accept it due to the form not being detailed enough, she has left some notes in areas where more can be added. Forms will be in the providers box at the front.

## 2023-01-21 NOTE — Telephone Encounter (Signed)
Notified pt MD completed put forms up front for pick-up.Marland Kitchen/l,mb

## 2023-01-23 NOTE — Progress Notes (Unsigned)
[place] Phone: [phone] Subjective:   Sydney [name], am serving as a scribe for Dr. Hulan Saas.  I'm seeing this patient by the request  of:  Plotnikov, Evie Lacks, MD  CC: Bilateral wrist pain, leg pain  [id number]  11/30/2022 We discussed the anatomy involved, and that carpal tunnel syndrome primarily involves the median nerve, and this typically affects digits one through 3.    We also discussed that mild cases of carpal tunnel syndrome are often improved with night splints which was given today.  If symptoms persist it is very reasonable to consider a carpal tunnel injection.    If the patient does have moderate to severe carpal tunnel syndrome based on NCV, then it is certainly reasonable to consider surgical consultation for definitive management possible carpal tunnel release.  We also discussed his severe carpal tunnel syndrome can lead to permanent nerve impairment even if released. At this point, the patient like to proceed conservatively  Known arthritis for 4 years but very minimal progression.  Nothing that is likely contributing to patient's symptomatology at this time.  Follow-up with me again after conservative therapy of bracing for the carpal tunnel and 4 to 6 weeks.  Otherwise would need to consider injections for diagnostic and therapeutic purposes if continuing to have hand pain     Updated 01/24/2023 Sydney [name] is a 57 y.o. female coming in with complaint of R hand and wrist pain. Has throbbing feeling in L wrist in January. Patient is wearing brace and using ergonomic keyboard and mouse. Does feel like this is helping but still having pain in thumb and wrist.   Also c/o pain radiating from back to R ankle. Sometimes has same symptoms on L side. Patient did develop a limp for a week when her right leg pain was at its worst. Using Advil for pain relief.       Past Medical  History:  Diagnosis Date   Hypertension    [name] past surgical history on file. Social History   Socioeconomic History   Marital status: Single    Spouse name: Not on file   Number of children: Not on file   Years of education: Not on file   Highest education level: Not on file  Occupational History   Not on file  Tobacco Use   Smoking status: Never   Smokeless tobacco: Never  Substance and Sexual Activity   Alcohol use: [name]   Drug use: [name]   Sexual activity: Not on file  Other Topics Concern   Not on file  Social History Narrative   Not on file   Social Determinants of Health   Financial Resource Strain: Not on file  Food Insecurity: Not on file  Transportation Needs: Not on file  Physical Activity: Not on file  Stress: Not on file  Social Connections: Not on file   Allergies  Allergen Reactions   Shellfish Allergy Hives    Facial urticaria   Enalapril Maleate     REACTION: cough   Family History  Problem Relation Age of Onset   Heart disease Brother 16       Brother died in Mar 13, 2023 - ?MI at 37     Current Outpatient Medications (Cardiovascular):    hydrochlorothiazide (HYDRODIURIL) 25 MG tablet, Take 1 tablet (25 mg total) by mouth daily.   losartan (COZAAR) 100 MG tablet, Take 1 tablet (100 mg total) by mouth daily.  Current Outpatient Medications (Other):    Cholecalciferol (VITAMIN D3) 50 MCG (2000 UT) capsule, Take 1 capsule (2,000 Units total) by mouth daily.   escitalopram (LEXAPRO) 5 MG tablet, Take 1 tablet (5 mg total) by mouth daily.   finasteride (PROSCAR) 5 MG tablet, 1/4 tab daily for hair loss   gabapentin (NEURONTIN) 100 MG capsule, Take 2 capsules (200 mg total) by mouth at bedtime.   QUEtiapine (SEROQUEL) 100 MG tablet, Take 1 tablet (100 mg total) by mouth at bedtime. For insomnia   tretinoin (RETIN-A) 0.025 % cream, Apply topically at bedtime.   triamcinolone cream (KENALOG) 0.1 %, Apply 1 Application topically 2 (two) times  daily.   Reviewed prior external information including notes and imaging from  primary care provider As well as notes that were available from care everywhere and other healthcare systems.  Past medical history, social, surgical and family history all reviewed in electronic medical record.  [name] pertanent information unless stated regarding to the chief complaint.   Review of Systems:  [name] headache, visual changes, nausea, vomiting, diarrhea, constipation, dizziness, abdominal pain, skin rash, fevers, chills, night sweats, weight loss, swollen lymph nodes, body aches, joint swelling, chest pain, shortness of breath, mood changes. POSITIVE muscle aches  Objective  Blood pressure 118/82, pulse 78, height 5' 2"$  (1.575 m), weight 170 lb (77.1 kg), last menstrual period 05/04/2012, SpO2 96 %.   General: [name] apparent distress alert and oriented x3 mood and affect normal, dressed appropriately.  HEENT: Pupils equal, extraocular movements intact  Respiratory: Patient's speak in full sentences and does not appear short of breath  Cardiovascular: [name] lower extremity edema, non tender, [name] erythema  Bilateral wrist still have mild Tinel's.  The left wrist actually seems worse than right which is different than previous exam. Leg exam has negative straight leg test.  Some tightness of the hamstring noted.   Limited muscular skeletal ultrasound was performed and interpreted by Hulan Saas, M  Limited ultrasound shows patient does have some flattening of the median nerve on the left wrist.  Right wrist still has some mild increase in area but Impression: Carpal tunnel bilaterally   Impression and Recommendations:     The above documentation has been reviewed and is accurate and complete Lyndal Pulley, DO

## 2023-01-24 ENCOUNTER — Ambulatory Visit (INDEPENDENT_AMBULATORY_CARE_PROVIDER_SITE_OTHER): Payer: Federal, State, Local not specified - PPO | Admitting: Family Medicine

## 2023-01-24 ENCOUNTER — Ambulatory Visit (INDEPENDENT_AMBULATORY_CARE_PROVIDER_SITE_OTHER): Payer: Federal, State, Local not specified - PPO

## 2023-01-24 ENCOUNTER — Ambulatory Visit: Payer: Self-pay

## 2023-01-24 VITALS — BP 118/82 | HR 78 | Ht 62.0 in | Wt 170.0 lb

## 2023-01-24 DIAGNOSIS — M25531 Pain in right wrist: Secondary | ICD-10-CM

## 2023-01-24 DIAGNOSIS — M545 Low back pain, unspecified: Secondary | ICD-10-CM | POA: Diagnosis not present

## 2023-01-24 DIAGNOSIS — G5601 Carpal tunnel syndrome, right upper limb: Secondary | ICD-10-CM | POA: Diagnosis not present

## 2023-01-24 DIAGNOSIS — D509 Iron deficiency anemia, unspecified: Secondary | ICD-10-CM

## 2023-01-24 DIAGNOSIS — M533 Sacrococcygeal disorders, not elsewhere classified: Secondary | ICD-10-CM

## 2023-01-24 DIAGNOSIS — R102 Pelvic and perineal pain: Secondary | ICD-10-CM | POA: Diagnosis not present

## 2023-01-24 MED ORDER — GABAPENTIN 100 MG PO CAPS: 200.0000 mg | ORAL_CAPSULE | Freq: Every day | ORAL | 0 refills | Status: DC

## 2023-01-24 NOTE — Assessment & Plan Note (Signed)
Has had anemia previously.  Discussed with patient Potentially iron deficiency could be playing a role at the moment extremities.  Patient would like to see how supplementation does before we try other laboratory workup.  Differential does include lumbar radiculopathy as well and we will lumbar x-ray to further evaluate.  Increase activity slowly.  Follow-up again in 6 to 8 weeks.

## 2023-01-24 NOTE — Assessment & Plan Note (Signed)
Now seems to be bilateral.  Very mild increase in the area of the median nerve noted on ultrasound today bilaterally.  The patient does have some flattening of the superficial aspect underneath the retinaculum on the left side.  Given brace for the left side secondary to the symptoms starting on the side.  Discussed which activities to do and which ones to avoid, increase activity slowly otherwise.  Worsening pain consider injection.  Will follow-up again in 6 to 8 weeks

## 2023-01-24 NOTE — Patient Instructions (Addendum)
Xray today Gabapentin 248m at night IBU 3 pills 3x a day for 3 days Iron 638mwith 50036mf Vit C L wrist brace Send me message in 2 weeks Have appt in 2 months

## 2023-01-24 NOTE — Assessment & Plan Note (Addendum)
Sacroiliac dysfunction.  Concern of some lumbar radiculopathy could be potentially playing with the leg pain.  Seems to be more cramping of the musculature.  Discussed icing regimen and home exercises, which activities to do and which ones to avoid.  Increase activity slowly.  Discussed core strengthening.  Follow-up again in 6 to 8 weeks started gabapentin 200 mg at night in case there is some radicular symptoms

## 2023-02-07 ENCOUNTER — Telehealth: Payer: Self-pay | Admitting: Internal Medicine

## 2023-02-07 ENCOUNTER — Telehealth: Payer: Self-pay | Admitting: Family Medicine

## 2023-02-07 NOTE — Telephone Encounter (Signed)
Spoke w patient per result.

## 2023-02-07 NOTE — Telephone Encounter (Signed)
Patient called and states the triamcinolone cream is not working and her rash has spread , she would like for something else to be prescribed for her.

## 2023-02-07 NOTE — Telephone Encounter (Signed)
Patient called asking if someone could call her in regards to her recent xray results. She is having trouble accessing MyChart and wanted to go over the results with her.

## 2023-02-12 MED ORDER — BETAMETHASONE DIPROPIONATE 0.05 % EX CREA: TOPICAL_CREAM | Freq: Two times a day (BID) | CUTANEOUS | 1 refills | Status: DC

## 2023-02-12 NOTE — Telephone Encounter (Signed)
Tried calling pt w/ MD response. Mailbox is full can not leave msg. Will call later.Marland KitchenJohny [name]

## 2023-02-12 NOTE — Telephone Encounter (Signed)
Pt return call inform of MD response.Marland KitchenJohny [name]

## 2023-02-12 NOTE — Telephone Encounter (Signed)
Tried to call pt again still no answer and VM is full.Marland KitchenJohny [name]

## 2023-02-12 NOTE — Telephone Encounter (Signed)
Okay betamethasone cream.  Office visit if not better.  Thank you

## 2023-02-13 ENCOUNTER — Telehealth: Payer: Self-pay | Admitting: *Deleted

## 2023-02-13 NOTE — Telephone Encounter (Signed)
Pt is needing PA on Saxenda. Submitted w/ (Key: [id number]) Your information has been submitted to [employer].Marland KitchenJohny Chess

## 2023-02-14 NOTE — Progress Notes (Signed)
This encounter was created in error - please disregard.

## 2023-02-15 ENCOUNTER — Telehealth: Payer: Self-pay | Admitting: *Deleted

## 2023-02-15 NOTE — Telephone Encounter (Signed)
Pt wrote letter stating...  Dr. Jacalyn Lefevre, I asked to go home early one day this week. My employer says my FMLA does nor specify half day; is not broken by how many hours I may need to leave work early.  They told me to ask MD to write a notes on [place] stating I may need to leave work early, or not go to work at all 1 to 5 days each week. When I can't sleep, which it happening now. I have to have more than 1 work day off to sleep/rest.  If you have any question pls call me [phone]. I can also try to put you in contact with a FMLA representative at my job.   Place note in MD purple folder../l;mb

## 2023-02-15 NOTE — Telephone Encounter (Signed)
Rec'd determination stating"  Your PA request has been approved. Authorization Expiration Date: August 13, 2023. Sent approval to pharmacy & notified pt.Sydney KitchenJohny Chess

## 2023-02-17 NOTE — Telephone Encounter (Signed)
Lucy, Can you write a note/letter please?  We can add it FMLA if needed. Thank you

## 2023-02-18 NOTE — Telephone Encounter (Signed)
Generated letter.. MD signed notified pt letter ready for pickup.Marland KitchenJohny Chess

## 2023-02-26 DIAGNOSIS — Z713 Dietary counseling and surveillance: Secondary | ICD-10-CM | POA: Diagnosis not present

## 2023-03-26 NOTE — Progress Notes (Unsigned)
[facility] [address] [place] [address] Phone: [phone] Subjective:   Bruce Donath, am serving as a scribe for Dr. Antoine Primas.  I'm seeing this patient by the request  of:  Plotnikov, Georgina Quint, MD  CC: Wrist pain and back pain  [id number]  01/24/2023 Sacroiliac dysfunction.  Concern of some lumbar radiculopathy could be potentially playing with the leg pain.  Seems to be more cramping of the musculature.  Discussed icing regimen and home exercises, which activities to do and which ones to avoid.  Increase activity slowly.  Discussed core strengthening.  Follow-up again in 6 to 8 weeks started gabapentin 200 mg at night in case there is some radicular symptoms   Has had anemia previously.  Discussed with patient Potentially iron deficiency could be playing a role at the moment extremities.  Patient would like to see how supplementation does before we try other laboratory workup.  Differential does include lumbar radiculopathy as well and we will lumbar x-ray to further evaluate.  Increase activity slowly.  Follow-up again in 6 to 8 weeks  Now seems to be bilateral.  Very mild increase in the area of the median nerve noted on ultrasound today bilaterally.  The patient does have some flattening of the superficial aspect underneath the retinaculum on the left side.  Given brace for the left side secondary to the symptoms starting on the side.  Discussed which activities to do and which ones to avoid, increase activity slowly otherwise.  Worsening pain consider injection.  Will follow-up again in 6 to 8 weeks     Updated 03/27/2023 Shalonda Sachse is a 57 y.o. female coming in with complaint of back and wrist pain. Patient states that she has ergonomic chair in the office but her back and neck increases when sitting in this chair.   Having spasms in the wrist and hand. Worse when she uses her hands a lot.   Also has constant numbness on top  of R foot that radiates up into the ankle. No injury and no increase in back pain recently.    Limitations of x-ray showing the patient did have a questionable kidney stone in the right kidney.,  Constipation and mild arthritic changes noted X-ray of the pelvis was unremarkable    Past Medical History:  Diagnosis Date   Hypertension    No past surgical history on file. Social History   Socioeconomic History   Marital status: Single    Spouse name: Not on file   Number of children: Not on file   Years of education: Not on file   Highest education level: Not on file  Occupational History   Not on file  Tobacco Use   Smoking status: Never   Smokeless tobacco: Never  Substance and Sexual Activity   Alcohol use: No   Drug use: No   Sexual activity: Not on file  Other Topics Concern   Not on file  Social History Narrative   Not on file   Social Determinants of Health   Financial Resource Strain: Not on file  Food Insecurity: Not on file  Transportation Needs: Not on file  Physical Activity: Not on file  Stress: Not on file  Social Connections: Not on file   Allergies  Allergen Reactions   Shellfish Allergy Hives    Facial urticaria   Enalapril Maleate     REACTION: cough   Family History  Problem Relation Age of Onset  Heart disease Brother 71       Brother died in 03/02/2023- ?MI at 44     Current Outpatient Medications (Cardiovascular):    hydrochlorothiazide (HYDRODIURIL) 25 MG tablet, Take 1 tablet (25 mg total) by mouth daily.   losartan (COZAAR) 100 MG tablet, Take 1 tablet (100 mg total) by mouth daily.     Current Outpatient Medications (Other):    betamethasone dipropionate 0.05 % cream, Apply topically 2 (two) times daily.   Cholecalciferol (VITAMIN D3) 50 MCG (2000 UT) capsule, Take 1 capsule (2,000 Units total) by mouth daily.   escitalopram (LEXAPRO) 5 MG tablet, Take 1 tablet (5 mg total) by mouth daily.   finasteride (PROSCAR) 5 MG tablet,  1/4 tab daily for hair loss   gabapentin (NEURONTIN) 100 MG capsule, Take 2 capsules (200 mg total) by mouth at bedtime.   QUEtiapine (SEROQUEL) 100 MG tablet, Take 1 tablet (100 mg total) by mouth at bedtime. For insomnia   tretinoin (RETIN-A) 0.025 % cream, Apply topically at bedtime.   Reviewed prior external information including notes and imaging from  primary care provider As well as notes that were available from care everywhere and other healthcare systems.  Past medical history, social, surgical and family history all reviewed in electronic medical record.  No pertanent information unless stated regarding to the chief complaint.   Review of Systems:  No headache, visual changes, nausea, vomiting, diarrhea, constipation, dizziness, abdominal pain, skin rash, fevers, chills, night sweats, weight loss, swollen lymph nodes, body aches, joint swelling, chest pain, shortness of breath, mood changes. POSITIVE muscle aches  Objective  Last menstrual period 05/04/2012.   General: No apparent distress alert and oriented x3 mood and affect normal, dressed appropriately.  HEENT: Pupils equal, extraocular movements intact  Respiratory: Patient's speak in full sentences and does not appear short of breath  Cardiovascular: No lower extremity edema, non tender, no erythema      Impression and Recommendations:

## 2023-03-27 ENCOUNTER — Other Ambulatory Visit (INDEPENDENT_AMBULATORY_CARE_PROVIDER_SITE_OTHER): Payer: Federal, State, Local not specified - PPO

## 2023-03-27 ENCOUNTER — Other Ambulatory Visit: Payer: Self-pay

## 2023-03-27 ENCOUNTER — Ambulatory Visit: Payer: Federal, State, Local not specified - PPO | Admitting: Family Medicine

## 2023-03-27 VITALS — BP 106/78 | HR 78 | Ht 62.0 in | Wt 170.0 lb

## 2023-03-27 DIAGNOSIS — M25531 Pain in right wrist: Secondary | ICD-10-CM | POA: Diagnosis not present

## 2023-03-27 DIAGNOSIS — G5601 Carpal tunnel syndrome, right upper limb: Secondary | ICD-10-CM

## 2023-03-27 DIAGNOSIS — M25532 Pain in left wrist: Secondary | ICD-10-CM

## 2023-03-27 DIAGNOSIS — M2550 Pain in unspecified joint: Secondary | ICD-10-CM | POA: Diagnosis not present

## 2023-03-27 DIAGNOSIS — M545 Low back pain, unspecified: Secondary | ICD-10-CM | POA: Diagnosis not present

## 2023-03-27 LAB — IBC PANEL
Iron: 82 ug/dL (ref 42–145)
Saturation Ratios: 24.1 % (ref 20.0–50.0)
TIBC: 340.2 ug/dL (ref 250.0–450.0)
Transferrin: 243 mg/dL (ref 212.0–360.0)

## 2023-03-27 LAB — COMPREHENSIVE METABOLIC PANEL
ALT: 16 U/L (ref 0–35)
AST: 15 U/L (ref 0–37)
Albumin: 4.2 g/dL (ref 3.5–5.2)
Alkaline Phosphatase: 65 U/L (ref 39–117)
BUN: 15 mg/dL (ref 6–23)
CO2: 34 mEq/L — ABNORMAL HIGH (ref 19–32)
Calcium: 9.5 mg/dL (ref 8.4–10.5)
Chloride: 99 mEq/L (ref 96–112)
Creatinine, Ser: 0.83 mg/dL (ref 0.40–1.20)
GFR: 79.72 mL/min (ref >60.00)
Glucose, Bld: 98 mg/dL (ref 70–99)
Potassium: 3.4 mEq/L — ABNORMAL LOW (ref 3.5–5.1)
Sodium: 140 mEq/L (ref 135–145)
Total Bilirubin: 0.3 mg/dL (ref 0.2–1.2)
Total Protein: 7.1 g/dL (ref 6.0–8.3)

## 2023-03-27 LAB — URIC ACID: Uric Acid, Serum: 4 mg/dL (ref 2.4–7.0)

## 2023-03-27 LAB — C-REACTIVE PROTEIN: CRP: <1 mg/dL (ref 0.5–20.0)

## 2023-03-27 NOTE — Assessment & Plan Note (Signed)
Patient has had some difficulty with lumbar pain.  We discussed with patient in great length about this.  We discussed that with the sensation on the top of the foot this could be more of a nerve irritation.  Discussed the possibility of injections.  Patient also having worsening carpal tunnel concerning for other possible demyelinating prognosis.  Patient wants to hold on the MRI and see what the labs show initially first and depending on that we will see if other further workup is necessary.  Total time reviewing patient's previous imaging and discussing with patient 31 minutes

## 2023-03-27 NOTE — Patient Instructions (Signed)
EMG B UE Labs today Send a message in 2 weeks, if not better will get MRI of back See me again in 2 months

## 2023-03-27 NOTE — Assessment & Plan Note (Signed)
Continues to have signs and symptoms as well as an ultrasound showing the patient does have dilatation of the median nerve that is consistent with carpal tunnel.  We discussed with patient in great length.  Discussed the possibility of injections which patient declined.  Patient would like to know the severity of this or if anything else is potentially going on and would like a nerve conduction test.  Will order this to further evaluate the upper extremities and make sure there is no cervical radiculopathy or other peripheral nerve pathology contributing.

## 2023-03-28 ENCOUNTER — Encounter: Payer: Self-pay | Admitting: Family Medicine

## 2023-03-28 LAB — SEDIMENTATION RATE: Sed Rate: 13 mm/hr (ref 0–30)

## 2023-03-28 LAB — CBC WITH DIFFERENTIAL/PLATELET
Basophils Absolute: 0 10*3/uL (ref 0.0–0.1)
Basophils Relative: 0.7 % (ref 0.0–3.0)
Eosinophils Absolute: 0 10*3/uL (ref 0.0–0.7)
Eosinophils Relative: 0.7 % (ref 0.0–5.0)
HCT: 38.6 % (ref 36.0–46.0)
Hemoglobin: 13.1 g/dL (ref 12.0–15.0)
Lymphocytes Relative: 44.7 % (ref 12.0–46.0)
Lymphs Abs: 2 10*3/uL (ref 0.7–4.0)
MCHC: 34 g/dL (ref 30.0–36.0)
MCV: 85.1 fl (ref 78.0–100.0)
Monocytes Absolute: 0.5 10*3/uL (ref 0.1–1.0)
Monocytes Relative: 10.2 % (ref 3.0–12.0)
Neutro Abs: 1.9 10*3/uL (ref 1.4–7.7)
Neutrophils Relative %: 43.7 % (ref 43.0–77.0)
Platelets: 287 10*3/uL (ref 150.0–400.0)
RBC: 4.54 Mil/uL (ref 3.87–5.11)
RDW: 13.6 % (ref 11.5–15.5)
WBC: 4.4 10*3/uL (ref 4.0–10.5)

## 2023-03-28 LAB — TSH: TSH: 1.18 u[IU]/mL (ref 0.35–5.50)

## 2023-03-28 LAB — PTH, INTACT AND CALCIUM: PTH: 28 pg/mL (ref 16–77)

## 2023-03-28 LAB — VITAMIN B12: Vitamin B-12: 369 pg/mL (ref 211–911)

## 2023-03-28 LAB — VITAMIN D 25 HYDROXY (VIT D DEFICIENCY, FRACTURES): VITD: 38.69 ng/mL (ref 30.00–100.00)

## 2023-03-28 LAB — FERRITIN: Ferritin: 96 ng/mL (ref 10.0–291.0)

## 2023-03-29 LAB — PTH, INTACT AND CALCIUM: Calcium: 9.8 mg/dL (ref 8.6–10.4)

## 2023-03-29 LAB — ANTI-NUCLEAR AB-TITER (ANA TITER): ANA Titer 1: 1:40 {titer} — ABNORMAL HIGH

## 2023-03-29 LAB — RHEUMATOID FACTOR: Rheumatoid fact SerPl-aCnc: <10 IU/mL (ref <14)

## 2023-03-30 LAB — ANTI-NUCLEAR AB-TITER (ANA TITER)

## 2023-03-30 LAB — ANGIOTENSIN CONVERTING ENZYME: Angiotensin-Converting Enzyme: 42 U/L (ref 9–67)

## 2023-03-30 LAB — CALCIUM, IONIZED: Calcium, Ion: 5.2 mg/dL (ref 4.7–5.5)

## 2023-03-30 LAB — ANA: Anti Nuclear Antibody (ANA): POSITIVE — AB

## 2023-03-30 LAB — CYCLIC CITRUL PEPTIDE ANTIBODY, IGG: Cyclic Citrullin Peptide Ab: <16 UNITS

## 2023-04-01 ENCOUNTER — Encounter: Payer: Self-pay | Admitting: Gastroenterology

## 2023-04-01 ENCOUNTER — Other Ambulatory Visit: Payer: Self-pay

## 2023-04-01 ENCOUNTER — Telehealth: Payer: Self-pay | Admitting: Family Medicine

## 2023-04-01 DIAGNOSIS — R202 Paresthesia of skin: Secondary | ICD-10-CM

## 2023-04-01 NOTE — Telephone Encounter (Signed)
Pt called to get lab results, has not used MyChart in years.  Read Dr. Michaelle Copas note to pt about low B12 and recommended dosages. I did not see anything else of concern but wanted to be sure as pt is very concerned about her foot pain.

## 2023-04-02 NOTE — Telephone Encounter (Signed)
Sent patient MyChart message with recommendation for B vitamin supplementation.

## 2023-04-13 ENCOUNTER — Other Ambulatory Visit: Payer: Self-pay | Admitting: Internal Medicine

## 2023-04-18 ENCOUNTER — Ambulatory Visit: Payer: Federal, State, Local not specified - PPO | Admitting: Internal Medicine

## 2023-04-19 DIAGNOSIS — Z713 Dietary counseling and surveillance: Secondary | ICD-10-CM | POA: Diagnosis not present

## 2023-04-25 ENCOUNTER — Encounter: Payer: Federal, State, Local not specified - PPO | Admitting: Neurology

## 2023-05-03 ENCOUNTER — Telehealth: Payer: Self-pay | Admitting: Internal Medicine

## 2023-05-03 NOTE — Telephone Encounter (Signed)
Patient dropped off document Work Accommodation forms, to be filled out by provider. Patient requested to send it via Call Patient to pick up within 7-days. Document is located in providers tray at front office.Please advise at [facility] [phone]

## 2023-05-06 NOTE — Telephone Encounter (Signed)
Place work accomodation form in MD purple folder for completion...Sydney [name]

## 2023-05-07 NOTE — Telephone Encounter (Signed)
Valentina Gu, can you look at it please? Thanks

## 2023-05-07 NOTE — Telephone Encounter (Signed)
Pt has picked up forms

## 2023-05-07 NOTE — Telephone Encounter (Signed)
MD finish form notified pt form is ready for pick-up.Marland KitchenRaechel Chute

## 2023-05-09 ENCOUNTER — Encounter: Payer: Self-pay | Admitting: Internal Medicine

## 2023-05-09 ENCOUNTER — Ambulatory Visit: Payer: Federal, State, Local not specified - PPO | Admitting: Internal Medicine

## 2023-05-09 VITALS — BP 118/80 | HR 63 | Temp 98.0°F | Ht 62.0 in | Wt 175.0 lb

## 2023-05-09 DIAGNOSIS — G4700 Insomnia, unspecified: Secondary | ICD-10-CM | POA: Diagnosis not present

## 2023-05-09 DIAGNOSIS — I10 Essential (primary) hypertension: Secondary | ICD-10-CM

## 2023-05-09 MED ORDER — QUETIAPINE FUMARATE 100 MG PO TABS: 100.0000 mg | ORAL_TABLET | Freq: Every day | ORAL | 5 refills | Status: DC

## 2023-05-09 MED ORDER — PHENTERMINE HCL 37.5 MG PO TABS: 37.5000 mg | ORAL_TABLET | Freq: Every morning | ORAL | 0 refills | Status: DC

## 2023-05-09 NOTE — Progress Notes (Signed)
Subjective:  Patient ID: Sydney [name], female    DOB: Oct 09, 1966  Age: 57 y.o. MRN: [id number]  CC: No chief complaint on file.   HPI [name] presents for stress and insomnia, HTN  Outpatient Medications Prior to Visit  Medication Sig Dispense Refill   phentermine (ADIPEX-P) 37.5 MG tablet Take 37.5 mg by mouth every morning.     betamethasone dipropionate 0.05 % cream Apply topically 2 (two) times daily. 45 g 1   Cholecalciferol (VITAMIN D3) 50 MCG (2000 UT) capsule Take 1 capsule (2,000 Units total) by mouth daily. 100 capsule 3   escitalopram (LEXAPRO) 5 MG tablet Take 1 tablet (5 mg total) by mouth daily. (Patient not taking: Reported on 05/13/2023) 90 tablet 1   finasteride (PROSCAR) 5 MG tablet 1/4 tab daily for hair loss (Patient not taking: Reported on 05/13/2023) 30 tablet 3   gabapentin (NEURONTIN) 100 MG capsule Take 2 capsules (200 mg total) by mouth at bedtime. (Patient not taking: Reported on 05/13/2023) 180 capsule 0   hydrochlorothiazide (HYDRODIURIL) 25 MG tablet Take 1 tablet (25 mg total) by mouth daily. 90 tablet 3   losartan (COZAAR) 100 MG tablet Take 1 tablet (100 mg total) by mouth daily. 90 tablet 3   tretinoin (RETIN-A) 0.025 % cream Apply topically at bedtime. (Patient not taking: Reported on 05/13/2023) 45 g 0   QUEtiapine (SEROQUEL) 100 MG tablet Take 1 tablet (100 mg total) by mouth at bedtime. For insomnia 90 tablet 1   No facility-administered medications prior to visit.    ROS: Review of Systems  Constitutional:  Negative for activity change, appetite change, chills, fatigue and unexpected weight change.  HENT:  Negative for congestion, mouth sores and sinus pressure.   Eyes:  Negative for visual disturbance.  Respiratory:  Negative for cough and chest tightness.   Gastrointestinal:  Negative for abdominal pain and nausea.  Genitourinary:  Negative for difficulty urinating, frequency and vaginal pain.  Musculoskeletal:  Negative for back pain  and gait problem.  Skin:  Negative for pallor and rash.  Neurological:  Negative for dizziness, tremors, weakness, numbness and headaches.  Psychiatric/Behavioral:  Negative for confusion and sleep disturbance.     Objective:  BP 118/80 (BP Location: Left Arm, Patient Position: Sitting, Cuff Size: Normal)   Pulse 63   Temp 98 F (36.7 C) (Oral)   Ht 5\' 2"  (1.575 m)   Wt 175 lb (79.4 kg)   LMP 05/04/2012   SpO2 95%   BMI 32.01 kg/m   BP Readings from Last 3 Encounters:  05/09/23 118/80  03/27/23 106/78  01/24/23 118/82    Wt Readings from Last 3 Encounters:  05/13/23 174 lb (78.9 kg)  05/09/23 175 lb (79.4 kg)  03/27/23 170 lb (77.1 kg)    Physical Exam Constitutional:      General: She is not in acute distress.    Appearance: She is well-developed.  HENT:     Head: Normocephalic.     Right Ear: External ear normal.     Left Ear: External ear normal.     Nose: Nose normal.  Eyes:     General:        Right eye: No discharge.        Left eye: No discharge.     Conjunctiva/sclera: Conjunctivae normal.     Pupils: Pupils are equal, round, and reactive to light.  Neck:     Thyroid: No thyromegaly.     Vascular: No JVD.  Trachea: No tracheal deviation.  Cardiovascular:     Rate and Rhythm: Normal rate and regular rhythm.     Heart sounds: Normal heart sounds.  Pulmonary:     Effort: No respiratory distress.     Breath sounds: No stridor. No wheezing.  Abdominal:     General: Bowel sounds are normal. There is no distension.     Palpations: Abdomen is soft. There is no mass.     Tenderness: There is no abdominal tenderness. There is no guarding or rebound.  Musculoskeletal:        General: No tenderness.     Cervical back: Normal range of motion and neck supple. No rigidity.  Lymphadenopathy:     Cervical: No cervical adenopathy.  Skin:    Findings: No erythema or rash.  Neurological:     Cranial Nerves: No cranial nerve deficit.     Motor: No abnormal  muscle tone.     Coordination: Coordination normal.     Deep Tendon Reflexes: Reflexes normal.  Psychiatric:        Behavior: Behavior normal.        Thought Content: Thought content normal.        Judgment: Judgment normal.   Sad affect Looks tired  Lab Results  Component Value Date   WBC 4.4 03/27/2023   HGB 13.1 03/27/2023   HCT 38.6 03/27/2023   PLT 287.0 03/27/2023   GLUCOSE 98 03/27/2023   CHOL 186 01/26/2021   TRIG 211.0 (H) 01/26/2021   HDL 61.80 01/26/2021   LDLDIRECT 97.0 01/26/2021   LDLCALC 67 03/24/2018   ALT 16 03/27/2023   AST 15 03/27/2023   NA 140 03/27/2023   K 3.4 (L) 03/27/2023   CL 99 03/27/2023   CREATININE 0.83 03/27/2023   BUN 15 03/27/2023   CO2 34 (H) 03/27/2023   TSH 1.18 03/27/2023   HGBA1C 5.9 01/30/2022    CT CARDIAC SCORING (SELF PAY ONLY)  Addendum Date: 05/12/2021   ADDENDUM REPORT: 05/12/2021 21:34 CLINICAL DATA:  Cardiovascular disease risk stratification EXAM: CT Coronary Calcium Score TECHNIQUE: A gated, non-contrast computed tomography scan of the heart was performed using 3mm slice thickness. Axial images were analyzed on a dedicated workstation. Calcium scoring of the coronary arteries was performed using the Agatston method. FINDINGS: Coronary Calcium Score: Left main: 0 Left anterior descending artery: 0 Left circumflex artery: 0 Right coronary artery: 0 Total: 0 Pericardium: Normal. Ascending Aorta: Not well visualized for measurement. Non-cardiac: See separate report from [facility]. IMPRESSION: Coronary calcium score of 0. RECOMMENDATIONS: Coronary artery calcium (CAC) score is a strong predictor of incident coronary heart disease (CHD) and provides predictive information beyond traditional risk factors. CAC scoring is reasonable to use in the decision to withhold, postpone, or initiate statin therapy in intermediate-risk or selected borderline-risk asymptomatic adults (age 37-75 years and LDL-C >=70 to <190 mg/dL) who do not  have diabetes or established atherosclerotic cardiovascular disease (ASCVD).* In intermediate-risk (10-year ASCVD risk >=7.5% to <20%) adults or selected borderline-risk (10-year ASCVD risk >=5% to <7.5%) adults in whom a CAC score is measured for the purpose of making a treatment decision the following recommendations have been made: If CAC=0, it is reasonable to withhold statin therapy and reassess in 5 to 10 years, as long as higher risk conditions are absent (diabetes mellitus, family history of premature CHD in first degree relatives (males <55 years; females <65 years), cigarette smoking, or LDL >=190 mg/dL). If CAC is 1 to 99, it is reasonable  to initiate statin therapy for patients >=19 years of age. If CAC is >=100 or >=75th percentile, it is reasonable to initiate statin therapy at any age. Cardiology referral should be considered for patients with CAC scores >=400 or >=75th percentile. *2018 AHA/ACC/AACVPR/AAPA/ABC/ACPM/ADA/AGS/APhA/ASPC/NLA/PCNA Guideline on the Management of Blood Cholesterol: A Report of the American College of Cardiology/American Heart Association Task Force on Clinical Practice Guidelines. J Am Coll Cardiol. 2019;73(24):3168-3209. Electronically Signed   By: Weston Brass   On: 05/12/2021 21:34   Result Date: 05/12/2021 EXAM: OVER-READ INTERPRETATION  CT CHEST The following report is an over-read performed by radiologist Dr. Jeronimo Greaves of [facility] on 05/12/2021. This over-read does not include interpretation of cardiac or coronary anatomy or pathology. The calcium score interpretation by the cardiologist is attached. COMPARISON:  Chest radiograph 07/30/2007 FINDINGS: Vascular: Normal aortic caliber. Mediastinum/Nodes: No imaged thoracic adenopathy. Lungs/Pleura: No pleural fluid. Lingular scarring or subsegmental atelectasis. Upper Abdomen: Normal imaged portions of the liver, spleen, stomach. Musculoskeletal: No acute osseous abnormality. Upper to midthoracic  spondylosis. IMPRESSION: No acute findings in the imaged extracardiac chest. Electronically Signed: By: Jeronimo Greaves M.D. On: 05/12/2021 16:23    Assessment & Plan:   Problem List Items Addressed This Visit     Essential hypertension    Continue current therapy.  On losartan and HCTZ      Insomnia - Primary    We will try Seroquel 100 mg tablets-she will start with one quarter or one half at bedtime and titrate up.  Take 1 hour prior to sleep time. Try  CBD gummies for insomnia         Meds ordered this encounter  Medications   phentermine (ADIPEX-P) 37.5 MG tablet    Sig: Take 1 tablet (37.5 mg total) by mouth every morning.    Dispense:  90 tablet    Refill:  0   QUEtiapine (SEROQUEL) 100 MG tablet    Sig: Take 1 tablet (100 mg total) by mouth at bedtime. For insomnia    Dispense:  30 tablet    Refill:  5      Follow-up: Return in about 3 months (around 08/09/2023) for a follow-up visit.  Sonda Primes, MD

## 2023-05-09 NOTE — Patient Instructions (Signed)
Seroquel 100 mg tablets - start with one quarter or one half at bedtime and titrate up.  Take 1 hour prior to sleep time.  Try  CBD gummies for insomnia

## 2023-05-09 NOTE — Assessment & Plan Note (Addendum)
We will try Seroquel 100 mg tablets-she will start with one quarter or one half at bedtime and titrate up.  Take 1 hour prior to sleep time. Try  CBD gummies for insomnia

## 2023-05-13 ENCOUNTER — Ambulatory Visit (AMBULATORY_SURGERY_CENTER): Payer: Federal, State, Local not specified - PPO | Admitting: *Deleted

## 2023-05-13 ENCOUNTER — Telehealth: Payer: Self-pay

## 2023-05-13 VITALS — Ht 62.0 in | Wt 174.0 lb

## 2023-05-13 DIAGNOSIS — Z1211 Encounter for screening for malignant neoplasm of colon: Secondary | ICD-10-CM

## 2023-05-13 MED ORDER — NA SULFATE-K SULFATE-MG SULF 17.5-3.13-1.6 GM/177ML PO SOLN: 1.0000 | Freq: Once | ORAL | 0 refills | Status: AC

## 2023-05-13 NOTE — Progress Notes (Signed)
Pt's name and DOB verified at the beginning of the pre-visit.  Pt denies any difficulty with ambulating,sitting, laying down or rolling side to side Gave both [facility] main # and MD on call # prior to instructions.  No egg or soy allergy known to patient  No issues known to pt with past sedation with any surgeries or procedures Pt denies having issues being intubated Pt has no issues moving head neck or swallowing No FH of Malignant Hyperthermia Pt is not on diet pills Pt is not on home 02  Pt is not on blood thinners  Pt denies issues with constipation  Pt is not on dialysis Pt denise any abnormal heart rhythms  Pt denies any upcoming cardiac testing Pt encouraged to use to use Singlecare or Goodrx to reduce cost  Patient's chart reviewed by Cathlyn Parsons CNRA prior to pre-visit and patient appropriate for the [facility].  Pre-visit completed and red dot placed by patient's name on their procedure day (on provider's schedule).  . Visit by phone Pt states weight is 174 lb Instructed pt why it is important to and  to call if they have any changes in health or new medications. Directed them to the # given and on instructions.   Pt states they will.  Instructions reviewed with pt and pt states understanding. Instructed to review again prior to procedure. Pt states they will.  Instructions sent by mail with coupon and by my chart Kidney stone currently on R

## 2023-05-13 NOTE — Telephone Encounter (Signed)
Place form in MD purple folder../lmb 

## 2023-05-13 NOTE — Telephone Encounter (Signed)
Pt has called and stated her pharmacy informed her she isneeding a PA for   phentermine (ADIPEX-P) 37.5 MG tablet

## 2023-05-13 NOTE — Telephone Encounter (Signed)
Patient's employer has more forms they need filled out, pt has dropped them off and would like to be called for pickup when they are ready.   Please call [phone] when ready.

## 2023-05-14 ENCOUNTER — Telehealth: Payer: Self-pay

## 2023-05-14 ENCOUNTER — Other Ambulatory Visit (HOSPITAL_COMMUNITY): Payer: Self-pay

## 2023-05-14 NOTE — Telephone Encounter (Signed)
Pharmacy Patient Advocate Encounter   Received notification that prior authorization for Phentermine 37.5mg  tabs is required/requested.   PA submitted to [facility] via CoverMyMeds Key  # [id number] Status is pending

## 2023-05-15 NOTE — Assessment & Plan Note (Signed)
Continue current therapy.  On losartan and HCTZ 

## 2023-05-15 NOTE — Telephone Encounter (Signed)
Pharmacy Patient Advocate Encounter  Received notification from [facility] FEP that the request for prior authorization for Phentermine has been denied due to the use of this medication in combination with another prior authorization medication for weight loss does not establish medical necessity for this drug. Marland Kitchen    Please be advised we currently do not have a Pharmacist to review denials, therefore you will need to process appeals accordingly as needed. Thanks for your support at this time.   You may fax [phone], to appeal.   Denial letter indexed to chart

## 2023-05-21 NOTE — Telephone Encounter (Signed)
It has been done, I believe.  Thanks 

## 2023-05-21 NOTE — Telephone Encounter (Signed)
Patient called to check on the status of her paper work. She has an appointment tomorrow 05/22/23 and would like to pick it up, if possible. Best callback is [phone].

## 2023-05-22 ENCOUNTER — Encounter: Payer: Self-pay | Admitting: Internal Medicine

## 2023-05-22 ENCOUNTER — Ambulatory Visit: Payer: Federal, State, Local not specified - PPO | Admitting: Internal Medicine

## 2023-05-22 VITALS — BP 120/78 | HR 60 | Temp 98.2°F | Ht 62.0 in | Wt 172.0 lb

## 2023-05-22 DIAGNOSIS — F418 Other specified anxiety disorders: Secondary | ICD-10-CM

## 2023-05-22 NOTE — Progress Notes (Signed)
Subjective:  Patient ID: Sydney [name], female    DOB: Mar 16, 1966  Age: 57 y.o. 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  She can work normally formal.  Outpatient Medications Prior to Visit  Medication Sig Dispense Refill   Cholecalciferol (VITAMIN D3) 50 MCG (2000 UT) capsule Take 1 capsule (2,000 Units total) by mouth daily. 100 capsule 3   escitalopram (LEXAPRO) 5 MG tablet Take 1 tablet (5 mg total) by mouth daily. 90 tablet 1   finasteride (PROSCAR) 5 MG tablet 1/4 tab daily for hair loss 30 tablet 3   hydrochlorothiazide (HYDRODIURIL) 25 MG tablet Take 1 tablet (25 mg total) by mouth daily. 90 tablet 3   losartan (COZAAR) 100 MG tablet Take 1 tablet (100 mg total) by mouth daily. 90 tablet 3   phentermine (ADIPEX-P) 37.5 MG tablet Take 1 tablet (37.5 mg total) by mouth every morning. 90 tablet 0   QUEtiapine (SEROQUEL) 100 MG tablet Take 1 tablet (100 mg total) by mouth at bedtime. For insomnia 30 tablet 5   Na Sulfate-K Sulfate-Mg Sulf 17.5-3.13-1.6 GM/177ML SOLN Take by mouth.     betamethasone dipropionate 0.05 % cream Apply topically 2 (two) times daily. (Patient not taking: Reported on 05/22/2023) 45 g 1   tretinoin (RETIN-A) 0.025 % cream Apply topically at bedtime. (Patient not taking: Reported on 05/13/2023) 45 g 0   gabapentin (NEURONTIN) 100 MG capsule Take 2 capsules (200 mg total) by mouth at bedtime. (Patient not taking: Reported on 05/13/2023) 180 capsule 0   No facility-administered medications prior to visit.    ROS: Review of Systems  Psychiatric/Behavioral:  Positive for decreased concentration, dysphoric mood and sleep disturbance. Negative for agitation, confusion, self-injury and suicidal ideas. The patient is nervous/anxious.     Objective:  BP 120/78 (BP Location: Left Arm, Patient Position:  Sitting, Cuff Size: Normal)   Pulse 60   Temp 98.2 F (36.8 C) (Oral)   Ht 5\' 2"  (1.575 m)   Wt 172 lb (78 kg)   LMP 05/04/2012   SpO2 98%   BMI 31.46 kg/m   BP Readings from Last 3 Encounters:  05/22/23 120/78  05/09/23 118/80  03/27/23 106/78    Wt Readings from Last 3 Encounters:  05/22/23 172 lb (78 kg)  05/13/23 174 lb (78.9 kg)  05/09/23 175 lb (79.4 kg)    Physical Exam Constitutional:      Appearance: Normal appearance.  Psychiatric:        Behavior: Behavior normal.        Thought Content: Thought content normal.        Judgment: Judgment normal.     Lab Results  Component Value Date   WBC 4.4 03/27/2023   HGB 13.1 03/27/2023   HCT 38.6 03/27/2023   PLT 287.0 03/27/2023   GLUCOSE 98 03/27/2023   CHOL 186 01/26/2021   TRIG 211.0 (H) 01/26/2021   HDL 61.80 01/26/2021   LDLDIRECT 97.0 01/26/2021   LDLCALC 67 03/24/2018   ALT 16 03/27/2023   AST 15 03/27/2023   NA 140 03/27/2023   K 3.4 (L) 03/27/2023   CL 99 03/27/2023   CREATININE 0.83 03/27/2023   BUN 15 03/27/2023   CO2 34 (H) 03/27/2023   TSH 1.18 03/27/2023   HGBA1C 5.9 01/30/2022    CT CARDIAC SCORING (SELF PAY ONLY)  Addendum Date: 05/12/2021   ADDENDUM REPORT: 05/12/2021 21:34 CLINICAL DATA:  Cardiovascular disease risk stratification EXAM: CT Coronary Calcium Score TECHNIQUE: A gated, non-contrast computed tomography scan of the heart was performed using 3mm slice thickness. Axial images were analyzed on a dedicated workstation. Calcium scoring of the coronary arteries was performed using the Agatston method. FINDINGS: Coronary Calcium Score: Left main: 0 Left anterior descending artery: 0 Left circumflex artery: 0 Right coronary artery: 0 Total: 0 Pericardium: Normal. Ascending Aorta: Not well visualized for measurement. Non-cardiac: See separate report from [facility]. IMPRESSION: Coronary calcium score of 0. RECOMMENDATIONS: Coronary artery calcium (CAC) score is a strong  predictor of incident coronary heart disease (CHD) and provides predictive information beyond traditional risk factors. CAC scoring is reasonable to use in the decision to withhold, postpone, or initiate statin therapy in intermediate-risk or selected borderline-risk asymptomatic adults (age 55-75 years and LDL-C >=70 to <190 mg/dL) who do not have diabetes or established atherosclerotic cardiovascular disease (ASCVD).* In intermediate-risk (10-year ASCVD risk >=7.5% to <20%) adults or selected borderline-risk (10-year ASCVD risk >=5% to <7.5%) adults in whom a CAC score is measured for the purpose of making a treatment decision the following recommendations have been made: If CAC=0, it is reasonable to withhold statin therapy and reassess in 5 to 10 years, as long as higher risk conditions are absent (diabetes mellitus, family history of premature CHD in first degree relatives (males <55 years; females <65 years), cigarette smoking, or LDL >=190 mg/dL). If CAC is 1 to 99, it is reasonable to initiate statin therapy for patients >=55 years of age. If CAC is >=100 or >=75th percentile, it is reasonable to initiate statin therapy at any age. Cardiology referral should be considered for patients with CAC scores >=400 or >=75th percentile. *2018 AHA/ACC/AACVPR/AAPA/ABC/ACPM/ADA/AGS/APhA/ASPC/NLA/PCNA Guideline on the Management of Blood Cholesterol: A Report of the American College of Cardiology/American Heart Association Task Force on Clinical Practice Guidelines. J Am Coll Cardiol. 2019;73(24):3168-3209. Electronically Signed   By: Weston Brass   On: 05/12/2021 21:34   Result Date: 05/12/2021 EXAM: OVER-READ INTERPRETATION  CT CHEST The following report is an over-read performed by radiologist Dr. Jeronimo Greaves of [facility] on 05/12/2021. This over-read does not include interpretation of cardiac or coronary anatomy or pathology. The calcium score interpretation by the cardiologist is attached.  COMPARISON:  Chest radiograph 07/30/2007 FINDINGS: Vascular: Normal aortic caliber. Mediastinum/Nodes: No imaged thoracic adenopathy. Lungs/Pleura: No pleural fluid. Lingular scarring or subsegmental atelectasis. Upper Abdomen: Normal imaged portions of the liver, spleen, stomach. Musculoskeletal: No acute osseous abnormality. Upper to midthoracic spondylosis. IMPRESSION: No acute findings in the imaged extracardiac chest. Electronically Signed: By: Jeronimo Greaves M.D. On: 05/12/2021 16:23    Assessment & Plan:   Problem List Items Addressed This Visit     Situational anxiety - Primary    Will fill out the form.  The patient is able to work from home.  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           No orders of the defined types were placed in this encounter.     Follow-up: No follow-ups on file.  Sonda Primes, MD

## 2023-05-23 ENCOUNTER — Telehealth: Payer: Self-pay

## 2023-05-23 DIAGNOSIS — Z0279 Encounter for issue of other medical certificate: Secondary | ICD-10-CM

## 2023-05-23 NOTE — Telephone Encounter (Signed)
Called patient to coordinate getting the paper work she dropped off on 6/10 that could not be found yesterday. Spoke with patient on way out after appointment and she stated paper was due today and she wold get additional copies to [place] today to complete and we return to her.   Patient does not have VM setup, will continue to reach out to patient.  Called Mom, Verlee Monte, okay per DPR and no VM setup also.

## 2023-05-23 NOTE — Telephone Encounter (Addendum)
Patient returned call, I scheduled to pickup up paperwork from her office:   Paperwork picked up; reviewed and it is same paperwork just need additional information added to it on last page.   Paperwork given to Dr. Posey Rea and he is aware patient is waiting for call back today.

## 2023-05-23 NOTE — Telephone Encounter (Signed)
Called patient and let her know paper is completed and is ready for pickup. It is placed at front desk.

## 2023-05-24 NOTE — Telephone Encounter (Signed)
Thank you :)

## 2023-05-24 NOTE — Telephone Encounter (Signed)
Patient picked up paper work 05/24/2023.

## 2023-06-05 ENCOUNTER — Telehealth: Payer: Self-pay | Admitting: Gastroenterology

## 2023-06-05 ENCOUNTER — Other Ambulatory Visit: Payer: Self-pay

## 2023-06-05 DIAGNOSIS — Z1211 Encounter for screening for malignant neoplasm of colon: Secondary | ICD-10-CM

## 2023-06-05 MED ORDER — NA SULFATE-K SULFATE-MG SULF 17.5-3.13-1.6 GM/177ML PO SOLN
1.00 | Freq: Once | ORAL | 0 refills | Status: AC
Start: 2023-06-05 — End: 2023-06-05

## 2023-06-05 NOTE — Telephone Encounter (Signed)
Rx resent new prep instructions provided via mychart. Hard copy to be mailed out. Pt made aware.

## 2023-06-05 NOTE — Telephone Encounter (Signed)
Inbound call patient requesting to have prep medication resent to pharmacy. Would also like update prep instructions sent to her. Please advise, thank you.

## 2023-06-11 ENCOUNTER — Encounter: Payer: Federal, State, Local not specified - PPO | Admitting: Gastroenterology

## 2023-06-21 DIAGNOSIS — Z713 Dietary counseling and surveillance: Secondary | ICD-10-CM | POA: Diagnosis not present

## 2023-06-24 NOTE — Assessment & Plan Note (Signed)
Will fill out the form.  The patient is able to work from home.  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

## 2023-07-18 ENCOUNTER — Encounter (INDEPENDENT_AMBULATORY_CARE_PROVIDER_SITE_OTHER): Payer: Self-pay

## 2023-07-22 ENCOUNTER — Encounter: Payer: Self-pay | Admitting: Gastroenterology

## 2023-07-23 ENCOUNTER — Encounter: Payer: Federal, State, Local not specified - PPO | Admitting: Gastroenterology

## 2023-07-31 ENCOUNTER — Telehealth: Payer: Self-pay | Admitting: Internal Medicine

## 2023-07-31 NOTE — Telephone Encounter (Signed)
Patient called about a bill she received from an appointment with Dr. Claiborne Billings. She said she is not supposed to have a bill and would like to speak with a manager about the issue. Patient would like a call back at [phone].

## 2023-08-02 NOTE — Telephone Encounter (Signed)
Patient is upset that she was charge for paper work completed on 6/20.  I explained that this is a legit bill due to the extensive time to complete the paperwork and the additional requests by the company. She expressed she has never paid a bill for paperwork. I explained that this should have been a charge as something of this magnitude does get charged. She agreed and said she would take care of it.

## 2023-08-09 DIAGNOSIS — L0100 Impetigo, unspecified: Secondary | ICD-10-CM | POA: Diagnosis not present

## 2023-08-13 ENCOUNTER — Encounter: Payer: Self-pay | Admitting: Internal Medicine

## 2023-08-13 ENCOUNTER — Ambulatory Visit: Payer: Federal, State, Local not specified - PPO | Admitting: Internal Medicine

## 2023-08-13 VITALS — BP 116/84 | HR 75 | Temp 97.6°F | Ht 62.0 in | Wt 171.0 lb

## 2023-08-13 DIAGNOSIS — E559 Vitamin D deficiency, unspecified: Secondary | ICD-10-CM

## 2023-08-13 DIAGNOSIS — Z91013 Allergy to seafood: Secondary | ICD-10-CM

## 2023-08-13 DIAGNOSIS — R21 Rash and other nonspecific skin eruption: Secondary | ICD-10-CM

## 2023-08-13 DIAGNOSIS — I10 Essential (primary) hypertension: Secondary | ICD-10-CM

## 2023-08-13 MED ORDER — CHLORHEXIDINE GLUCONATE 4 % EX SOLN: Freq: Every day | CUTANEOUS | 0 refills | Status: AC | PRN

## 2023-08-13 NOTE — Progress Notes (Signed)
Subjective:  Patient ID: Sydney [name], female    DOB: 04/15/1966  Age: 57 y.o. MRN: [id number]  CC: Medical Management of Chronic Issues (3 month f/u)   HPI Sydney [name] presents for facial rash since Feb or March Pt went to [facility] 08/09/2023 and dx'ed w/Impetigo - Dr Manson Passey gave Bactrim DS and Bactroban oint. May be getting better...   Outpatient Medications Prior to Visit  Medication Sig Dispense Refill   Cholecalciferol (VITAMIN D3) 50 MCG (2000 UT) capsule Take 1 capsule (2,000 Units total) by mouth daily. 100 capsule 3   escitalopram (LEXAPRO) 5 MG tablet Take 1 tablet (5 mg total) by mouth daily. 90 tablet 1   finasteride (PROSCAR) 5 MG tablet 1/4 tab daily for hair loss 30 tablet 3   hydrochlorothiazide (HYDRODIURIL) 25 MG tablet Take 1 tablet (25 mg total) by mouth daily. 90 tablet 3   losartan (COZAAR) 100 MG tablet Take 1 tablet (100 mg total) by mouth daily. 90 tablet 3   mupirocin ointment (BACTROBAN) 2 % Apply 1 Application topically 2 (two) times daily.     phentermine (ADIPEX-P) 37.5 MG tablet Take 1 tablet (37.5 mg total) by mouth every morning. 90 tablet 0   QUEtiapine (SEROQUEL) 100 MG tablet Take 1 tablet (100 mg total) by mouth at bedtime. For insomnia 30 tablet 5   betamethasone dipropionate 0.05 % cream Apply topically 2 (two) times daily. (Patient not taking: Reported on 08/13/2023) 45 g 1   tretinoin (RETIN-A) 0.025 % cream Apply topically at bedtime. (Patient not taking: Reported on 05/13/2023) 45 g 0   No facility-administered medications prior to visit.    ROS: Review of Systems  Constitutional:  Negative for activity change, appetite change, chills, fatigue and unexpected weight change.  HENT:  Negative for congestion, mouth sores and sinus pressure.   Eyes:  Negative for visual disturbance.  Respiratory:  Negative for cough and chest tightness.   Gastrointestinal:  Negative for abdominal pain and nausea.  Genitourinary:  Negative for difficulty urinating,  frequency and vaginal pain.  Musculoskeletal:  Negative for back pain and gait problem.  Skin:  Negative for pallor and rash.  Neurological:  Negative for dizziness, tremors, weakness, numbness and headaches.  Psychiatric/Behavioral:  Negative for confusion and sleep disturbance. The patient is nervous/anxious.     Objective:  BP 116/84 (BP Location: Left Arm, Patient Position: Sitting, Cuff Size: Large)   Pulse 75   Temp 97.6 F (36.4 C) (Temporal)   Ht 5\' 2"  (1.575 m)   Wt 171 lb (77.6 kg)   LMP 05/04/2012   SpO2 98%   BMI 31.28 kg/m   BP Readings from Last 3 Encounters:  08/13/23 116/84  05/22/23 120/78  05/09/23 118/80    Wt Readings from Last 3 Encounters:  08/13/23 171 lb (77.6 kg)  05/22/23 172 lb (78 kg)  05/13/23 174 lb (78.9 kg)    Physical Exam Constitutional:      General: She is not in acute distress.    Appearance: She is well-developed. She is obese.  HENT:     Head: Normocephalic.     Right Ear: External ear normal.     Left Ear: External ear normal.     Nose: Nose normal.  Eyes:     General:        Right eye: No discharge.        Left eye: No discharge.     Conjunctiva/sclera: Conjunctivae normal.     Pupils: Pupils are equal, round,  and reactive to light.  Neck:     Thyroid: No thyromegaly.     Vascular: No JVD.     Trachea: No tracheal deviation.  Cardiovascular:     Rate and Rhythm: Normal rate and regular rhythm.     Heart sounds: Normal heart sounds.  Pulmonary:     Effort: No respiratory distress.     Breath sounds: No stridor. No wheezing.  Abdominal:     General: Bowel sounds are normal. There is no distension.     Palpations: Abdomen is soft. There is no mass.     Tenderness: There is no abdominal tenderness. There is no guarding or rebound.  Musculoskeletal:        General: No tenderness.     Cervical back: Normal range of motion and neck supple. No rigidity.  Lymphadenopathy:     Cervical: No cervical adenopathy.  Skin:     Findings: No erythema or rash.  Neurological:     Cranial Nerves: No cranial nerve deficit.     Motor: No abnormal muscle tone.     Coordination: Coordination normal.     Deep Tendon Reflexes: Reflexes normal.  Psychiatric:        Behavior: Behavior normal.        Thought Content: Thought content normal.        Judgment: Judgment normal.   Confluent papular rash on the face around the mouth and nose  Lab Results  Component Value Date   WBC 4.4 03/27/2023   HGB 13.1 03/27/2023   HCT 38.6 03/27/2023   PLT 287.0 03/27/2023   GLUCOSE 98 03/27/2023   CHOL 186 01/26/2021   TRIG 211.0 (H) 01/26/2021   HDL 61.80 01/26/2021   LDLDIRECT 97.0 01/26/2021   LDLCALC 67 03/24/2018   ALT 16 03/27/2023   AST 15 03/27/2023   NA 140 03/27/2023   K 3.4 (L) 03/27/2023   CL 99 03/27/2023   CREATININE 0.83 03/27/2023   BUN 15 03/27/2023   CO2 34 (H) 03/27/2023   TSH 1.18 03/27/2023   HGBA1C 5.9 01/30/2022    CT CARDIAC SCORING (SELF PAY ONLY)  Addendum Date: 05/12/2021   ADDENDUM REPORT: 05/12/2021 21:34 CLINICAL DATA:  Cardiovascular disease risk stratification EXAM: CT Coronary Calcium Score TECHNIQUE: A gated, non-contrast computed tomography scan of the heart was performed using 3mm slice thickness. Axial images were analyzed on a dedicated workstation. Calcium scoring of the coronary arteries was performed using the Agatston method. FINDINGS: Coronary Calcium Score: Left main: 0 Left anterior descending artery: 0 Left circumflex artery: 0 Right coronary artery: 0 Total: 0 Pericardium: Normal. Ascending Aorta: Not well visualized for measurement. Non-cardiac: See separate report from [facility]. IMPRESSION: Coronary calcium score of 0. RECOMMENDATIONS: Coronary artery calcium (CAC) score is a strong predictor of incident coronary heart disease (CHD) and provides predictive information beyond traditional risk factors. CAC scoring is reasonable to use in the decision to withhold,  postpone, or initiate statin therapy in intermediate-risk or selected borderline-risk asymptomatic adults (age 7-75 years and LDL-C >=70 to <190 mg/dL) who do not have diabetes or established atherosclerotic cardiovascular disease (ASCVD).* In intermediate-risk (10-year ASCVD risk >=7.5% to <20%) adults or selected borderline-risk (10-year ASCVD risk >=5% to <7.5%) adults in whom a CAC score is measured for the purpose of making a treatment decision the following recommendations have been made: If CAC=0, it is reasonable to withhold statin therapy and reassess in 5 to 10 years, as long as higher risk conditions are absent (diabetes  mellitus, family history of premature CHD in first degree relatives (males <55 years; females <65 years), cigarette smoking, or LDL >=190 mg/dL). If CAC is 1 to 99, it is reasonable to initiate statin therapy for patients >=65 years of age. If CAC is >=100 or >=75th percentile, it is reasonable to initiate statin therapy at any age. Cardiology referral should be considered for patients with CAC scores >=400 or >=75th percentile. *2018 AHA/ACC/AACVPR/AAPA/ABC/ACPM/ADA/AGS/APhA/ASPC/NLA/PCNA Guideline on the Management of Blood Cholesterol: A Report of the American College of Cardiology/American Heart Association Task Force on Clinical Practice Guidelines. J Am Coll Cardiol. 2019;73(24):3168-3209. Electronically Signed   By: Weston Brass   On: 05/12/2021 21:34   Result Date: 05/12/2021 EXAM: OVER-READ INTERPRETATION  CT CHEST The following report is an over-read performed by radiologist Dr. Jeronimo Greaves of [facility] on 05/12/2021. This over-read does not include interpretation of cardiac or coronary anatomy or pathology. The calcium score interpretation by the cardiologist is attached. COMPARISON:  Chest radiograph 07/30/2007 FINDINGS: Vascular: Normal aortic caliber. Mediastinum/Nodes: No imaged thoracic adenopathy. Lungs/Pleura: No pleural fluid. Lingular scarring or  subsegmental atelectasis. Upper Abdomen: Normal imaged portions of the liver, spleen, stomach. Musculoskeletal: No acute osseous abnormality. Upper to midthoracic spondylosis. IMPRESSION: No acute findings in the imaged extracardiac chest. Electronically Signed: By: Jeronimo Greaves M.D. On: 05/12/2021 16:23    Assessment & Plan:   Problem List Items Addressed This Visit     Essential hypertension    Continue current therapy.  On losartan and HCTZ      Rash of face - Primary    New facial rash since Feb or March Pt went to [facility] 08/09/2023 and dx'ed w/Impetigo - Dr Manson Passey gave Bactrim DS and Bactroban oint. May be getting better...      Vitamin D deficiency    On vitamin D      Seafood allergy     07/2023 facial rash since Feb or March Pt went to [facility] 08/09/2023 and dx'ed w/Impetigo - Dr Manson Passey gave Bactrim DS and Bactroban oint. May be getting better... Hibiclense - wash 2/wk         Meds ordered this encounter  Medications   chlorhexidine (HIBICLENS) 4 % external liquid    Sig: Apply topically daily as needed.    Dispense:  473 mL    Refill:  0      Follow-up: No follow-ups on file.  Sonda Primes, MD

## 2023-08-13 NOTE — Assessment & Plan Note (Signed)
  07/2023 facial rash since Feb or March Pt went to [facility] 08/09/2023 and dx'ed w/Impetigo - Dr Manson Passey gave Bactrim DS and Bactroban oint. May be getting better... Hibiclense - wash 2/wk

## 2023-08-13 NOTE — Assessment & Plan Note (Signed)
New facial rash since Feb or March Pt went to [facility] 08/09/2023 and dx'ed w/Impetigo - Dr Manson Passey gave Bactrim DS and Bactroban oint. May be getting better.Sydney KitchenMarland [name]

## 2023-08-19 NOTE — Assessment & Plan Note (Signed)
Continue current therapy.  On losartan and HCTZ

## 2023-08-19 NOTE — Assessment & Plan Note (Signed)
On vitamin D

## 2023-08-23 ENCOUNTER — Ambulatory Visit (AMBULATORY_SURGERY_CENTER): Payer: Federal, State, Local not specified - PPO

## 2023-08-23 VITALS — Ht 62.0 in | Wt 171.0 lb

## 2023-08-23 DIAGNOSIS — Z713 Dietary counseling and surveillance: Secondary | ICD-10-CM | POA: Diagnosis not present

## 2023-08-23 DIAGNOSIS — Z1211 Encounter for screening for malignant neoplasm of colon: Secondary | ICD-10-CM

## 2023-08-23 MED ORDER — NA SULFATE-K SULFATE-MG SULF 17.5-3.13-1.6 GM/177ML PO SOLN
1.0000 | Freq: Once | ORAL | 0 refills | Status: AC
Start: 2023-08-23 — End: 2023-08-23

## 2023-08-23 NOTE — Progress Notes (Signed)

## 2023-08-28 ENCOUNTER — Telehealth: Payer: Self-pay | Admitting: Internal Medicine

## 2023-08-28 MED ORDER — CLINDAMYCIN PHOSPHATE 1 % EX LOTN: TOPICAL_LOTION | Freq: Two times a day (BID) | CUTANEOUS | 0 refills | Status: AC

## 2023-08-28 MED ORDER — DOXYCYCLINE HYCLATE 100 MG PO TABS: 100.0000 mg | ORAL_TABLET | Freq: Two times a day (BID) | ORAL | 2 refills | Status: DC

## 2023-08-28 NOTE — Telephone Encounter (Signed)
Patient was seen on the 10th - rash on her face is still present - finished antibiotic and using cream - Please call patient and advise - [phone]

## 2023-08-28 NOTE — Telephone Encounter (Signed)
Start doxycycline 1 twice a day for 2 months Clindamycin lotion on the face rash twice a day Return to clinic in 2 months Thank you

## 2023-08-29 NOTE — Telephone Encounter (Signed)
Unable to reach patient. Unable to LVM due to VM box being full.

## 2023-08-29 NOTE — Telephone Encounter (Signed)
Patient is aware of the medication sent in for her and her 2 month f/u has been scheduled.

## 2023-08-30 NOTE — Telephone Encounter (Signed)
Patient called and needs to know what happened to  her prior authorization approved for this phentermine.  She would like someone to call her and explain this.

## 2023-09-02 ENCOUNTER — Telehealth: Payer: Self-pay | Admitting: Gastroenterology

## 2023-09-02 NOTE — Telephone Encounter (Signed)
Call and spoke with the patient in regards to her PA and she made me aware that this was the only medication that she's ever taken for her weight. So I did reach out to her insurances PA team and was able to correct the problem and let then know that she inf act was NOT taking 2 weight loss medications and this was the only medication that she has taken. The PA team did inform me that she was approved for another weight loss medication but she never picked up the medication or used it. So we redid her PA over the phone and she was approved for her medication. I will check in the morning for her PA approval fax. Patient is aware and very grateful.

## 2023-09-02 NOTE — Telephone Encounter (Signed)
Spoke with patient. She started taking the Doxycycline for a dermatitis rash. I told the patient that this would not interfere with her procedure. Patient verbalizes understanding.

## 2023-09-02 NOTE — Telephone Encounter (Signed)
Patent called stated she was just given some Doxycycline antibiotics 100 ml b.I.d on Friday and started them on Saturday. Also uses Clindamycin topical lotion 1% b.I.d. procedure is scheduled for 09/13/23. Please advise.

## 2023-09-11 NOTE — Telephone Encounter (Signed)
Pt states she has had some nuts in am (did not realize they were in her cereal) and she has split a salad and eaten it two days in a row last day yesterday. Instructed pt to hydrate (drink at least 8 oz every hour awake and reviewed what she can vs can't have till her procedure date of the 65 th. Pt stated she understood and she stated she has not taken her phentermine in over 10 days and knows why she should not till day after her procedure.

## 2023-09-11 NOTE — Telephone Encounter (Signed)
Patient needing to be further advise on prep instructions.

## 2023-09-13 ENCOUNTER — Ambulatory Visit: Payer: Federal, State, Local not specified - PPO | Admitting: Gastroenterology

## 2023-09-13 ENCOUNTER — Encounter: Payer: Self-pay | Admitting: Gastroenterology

## 2023-09-13 VITALS — BP 124/87 | HR 55 | Temp 98.4°F | Resp 12 | Ht 62.0 in | Wt 171.0 lb

## 2023-09-13 DIAGNOSIS — Z1211 Encounter for screening for malignant neoplasm of colon: Secondary | ICD-10-CM | POA: Diagnosis not present

## 2023-09-13 MED ORDER — SODIUM CHLORIDE 0.9 % IV SOLN: 500.0000 mL | INTRAVENOUS | Status: DC

## 2023-09-13 NOTE — Progress Notes (Signed)
[facility] History and Physical   Primary Care Physician:  Tresa Garter, MD   Reason for Procedure:   Colorectal cancer screening.  Grandmother with CRC.  mom and dad had negative colonoscopy previously  Plan:     colonoscopy     HPI: Sydney [name] is a 57 y.o. female    Past Medical History:  Diagnosis Date   Anemia    Anxiety    Chronic kidney disease    Diabetes mellitus without complication (HCC)    borderline diabetic   Hypertension     Past Surgical History:  Procedure Laterality Date   utrine firoid emboliztion     17 years ago    Prior to Admission medications   Medication Sig Start Date End Date Taking? Authorizing Provider  chlorhexidine (HIBICLENS) 4 % external liquid Apply topically daily as needed. 08/13/23  Yes Plotnikov, Georgina Quint, MD  Cholecalciferol (VITAMIN D3) 50 MCG (2000 UT) capsule Take 1 capsule (2,000 Units total) by mouth daily. 08/15/22  Yes Plotnikov, Georgina Quint, MD  clindamycin (CLEOCIN T) 1 % lotion Apply topically 2 (two) times daily. On face rash 08/28/23  Yes Plotnikov, Georgina Quint, MD  Cyanocobalamin (B-12) 1000 MCG TABS Take by mouth.   Yes [provider]  doxycycline (VIBRA-TABS) 100 MG tablet Take 1 tablet (100 mg total) by mouth 2 (two) times daily. 08/28/23  Yes Plotnikov, Georgina Quint, MD  hydrochlorothiazide (HYDRODIURIL) 25 MG tablet Take 1 tablet (25 mg total) by mouth daily. 08/15/22  Yes Plotnikov, Georgina Quint, MD  losartan (COZAAR) 100 MG tablet Take 1 tablet (100 mg total) by mouth daily. 08/15/22  Yes Plotnikov, Georgina Quint, MD  Pyridoxine HCl (B-6 PO) Take by mouth.   Yes [provider]  escitalopram (LEXAPRO) 5 MG tablet Take 1 tablet (5 mg total) by mouth daily. Patient not taking: Reported on 08/23/2023 08/15/22   Plotnikov, Georgina Quint, MD  Fe Bisgly-Succ-C-Thre-B12-FA (IRON 21/7 PO) Take by mouth.    [provider]  finasteride (PROSCAR) 5 MG tablet 1/4 tab daily for hair loss Patient  not taking: Reported on 08/23/2023 08/17/21   Plotnikov, Georgina Quint, MD  phentermine (ADIPEX-P) 37.5 MG tablet Take 1 tablet (37.5 mg total) by mouth every morning. 05/09/23   Plotnikov, Georgina Quint, MD  QUEtiapine (SEROQUEL) 100 MG tablet Take 1 tablet (100 mg total) by mouth at bedtime. For insomnia Patient not taking: Reported on 08/23/2023 05/09/23   Plotnikov, Georgina Quint, MD    Current Outpatient Medications  Medication Sig Dispense Refill   chlorhexidine (HIBICLENS) 4 % external liquid Apply topically daily as needed. 473 mL 0   Cholecalciferol (VITAMIN D3) 50 MCG (2000 UT) capsule Take 1 capsule (2,000 Units total) by mouth daily. 100 capsule 3   clindamycin (CLEOCIN T) 1 % lotion Apply topically 2 (two) times daily. On face rash 60 mL 0   Cyanocobalamin (B-12) 1000 MCG TABS Take by mouth.     doxycycline (VIBRA-TABS) 100 MG tablet Take 1 tablet (100 mg total) by mouth 2 (two) times daily. 60 tablet 2   hydrochlorothiazide (HYDRODIURIL) 25 MG tablet Take 1 tablet (25 mg total) by mouth daily. 90 tablet 3   losartan (COZAAR) 100 MG tablet Take 1 tablet (100 mg total) by mouth daily. 90 tablet 3   Pyridoxine HCl (B-6 PO) Take by mouth.     escitalopram (LEXAPRO) 5 MG tablet Take 1 tablet (5 mg total) by mouth daily. (Patient not taking: Reported on 08/23/2023) 90 tablet 1  Fe Bisgly-Succ-C-Thre-B12-FA (IRON 21/7 PO) Take by mouth.     finasteride (PROSCAR) 5 MG tablet 1/4 tab daily for hair loss (Patient not taking: Reported on 08/23/2023) 30 tablet 3   phentermine (ADIPEX-P) 37.5 MG tablet Take 1 tablet (37.5 mg total) by mouth every morning. 90 tablet 0   QUEtiapine (SEROQUEL) 100 MG tablet Take 1 tablet (100 mg total) by mouth at bedtime. For insomnia (Patient not taking: Reported on 08/23/2023) 30 tablet 5   Current Facility-Administered Medications  Medication Dose Route Frequency Provider Last Rate Last Admin   0.9 %  sodium chloride infusion  500 mL Intravenous Continuous Lynann Bologna, MD         Allergies as of 09/13/2023 - Review Complete 09/13/2023  Allergen Reaction Noted   Shellfish allergy Hives 01/22/2015   Enalapril maleate  01/21/2009    Family History  Problem Relation Age of Onset   Colon polyps Mother    Heart disease Brother 90       Brother died in March 17, 2023- ?MI at 37   Colon cancer Maternal Grandmother    Esophageal cancer Neg Hx    Rectal cancer Neg Hx    Stomach cancer Neg Hx     Social History   Socioeconomic History   Marital status: Single    Spouse name: Not on file   Number of children: Not on file   Years of education: Not on file   Highest education level: Not on file  Occupational History   Not on file  Tobacco Use   Smoking status: Never   Smokeless tobacco: Never  Substance and Sexual Activity   Alcohol use: No   Drug use: No   Sexual activity: Not Currently    Birth control/protection: Post-menopausal  Other Topics Concern   Not on file  Social History Narrative   Not on file   Social Determinants of Health   Financial Resource Strain: Not on file  Food Insecurity: Not on file  Transportation Needs: Not on file  Physical Activity: Not on file  Stress: Not on file  Social Connections: Not on file  Intimate Partner Violence: Not on file    Review of Systems: Positive for noe All other review of systems negative except as mentioned in the HPI.  Physical Exam: Vital signs in last 24 hours: @VSRANGES @   General:   Alert,  Well-developed, well-nourished, pleasant and cooperative in NAD Lungs:  Clear throughout to auscultation.   Heart:  Regular rate and rhythm; no murmurs, clicks, rubs,  or gallops. Abdomen:  Soft, nontender and nondistended. Normal bowel sounds.   Neuro/Psych:  Alert and cooperative. Normal mood and affect. A and O x 3    No significant changes were identified.  The patient continues to be an appropriate candidate for the planned procedure and anesthesia.   Edman Circle, MD. [facility]  Gastroenterology 09/13/2023 1:34 PM@

## 2023-09-13 NOTE — Progress Notes (Signed)
Uneventful anesthetic. Report to pacu rn. Vss. Care resumed by rn. 

## 2023-09-13 NOTE — Progress Notes (Signed)
Pt's states no medical or surgical changes since previsit or office visit. 

## 2023-09-13 NOTE — Patient Instructions (Signed)
Resume previous diet and medications. Repeat Colonoscopy in 10 years for surveillance.  YOU HAD AN ENDOSCOPIC PROCEDURE TODAY AT THE [facility]:   Refer to the procedure report that was given to you for any specific questions about what was found during the examination.  If the procedure report does not answer your questions, please call your gastroenterologist to clarify.  If you requested that your care partner not be given the details of your procedure findings, then the procedure report has been included in a sealed envelope for you to review at your convenience later.  YOU SHOULD EXPECT: Some feelings of bloating in the abdomen. Passage of more gas than usual.  Walking can help get rid of the air that was put into your GI tract during the procedure and reduce the bloating. If you had a lower endoscopy (such as a colonoscopy or flexible sigmoidoscopy) you may notice spotting of blood in your stool or on the toilet paper. If you underwent a bowel prep for your procedure, you may not have a normal bowel movement for a few days.  Please Note:  You might notice some irritation and congestion in your nose or some drainage.  This is from the oxygen used during your procedure.  There is no need for concern and it should clear up in a day or so.  SYMPTOMS TO REPORT IMMEDIATELY:  Following lower endoscopy (colonoscopy or flexible sigmoidoscopy):  Excessive amounts of blood in the stool  Significant tenderness or worsening of abdominal pains  Swelling of the abdomen that is new, acute  Fever of 100F or higher  For urgent or emergent issues, a gastroenterologist can be reached at any hour by calling [phone]. Do not use MyChart messaging for urgent concerns.    DIET:  We do recommend a small meal at first, but then you may proceed to your regular diet.  Drink plenty of fluids but you should avoid alcoholic beverages for 24 hours.  ACTIVITY:  You should plan to take it easy for the  rest of today and you should NOT DRIVE or use heavy machinery until tomorrow (because of the sedation medicines used during the test).    FOLLOW UP: Our staff will call the number listed on your records the next business day following your procedure.  We will call around 7:15- 8:00 am to check on you and address any questions or concerns that you may have regarding the information given to you following your procedure. If we do not reach you, we will leave a message.     If any biopsies were taken you will be contacted by phone or by letter within the next 1-3 weeks.  Please call us at [phone] if you have not heard about the biopsies in 3 weeks.    SIGNATURES/CONFIDENTIALITY: You and/or your care partner have signed paperwork which will be entered into your electronic medical record.  These signatures attest to the fact that that the information above on your After Visit Summary has been reviewed and is understood.  Full responsibility of the confidentiality of this discharge information lies with you and/or your care-partner.  

## 2023-09-13 NOTE — Op Note (Signed)
[facility] Patient Name: Sydney [name] Procedure Date: 09/13/2023 1:22 PM MRN: [id number] Endoscopist: Lynann Bologna , MD, [id number] Age: 57 Referring MD:  Date of Birth: 1966/02/12 Gender: Female Account #: [id number] Procedure:                Colonoscopy Indications:              Screening for colorectal malignant neoplasm- FH CRC                            GM. Medicines:                Monitored Anesthesia Care Procedure:                Pre-Anesthesia Assessment:                           - Prior to the procedure, a History and Physical                            was performed, and patient medications and                            allergies were reviewed. The patient's tolerance of                            previous anesthesia was also reviewed. The risks                            and benefits of the procedure and the sedation                            options and risks were discussed with the patient.                            All questions were answered, and informed consent                            was obtained. Prior Anticoagulants: The patient has                            taken no anticoagulant or antiplatelet agents. ASA                            Grade Assessment: II - A patient with mild systemic                            disease. After reviewing the risks and benefits,                            the patient was deemed in satisfactory condition to                            undergo the procedure.  After obtaining informed consent, the colonoscope                            was passed under direct vision. Throughout the                            procedure, the patient's blood pressure, pulse, and                            oxygen saturations were monitored continuously. The                            Olympus Scope [id number] was introduced through the                            anus and advanced to the 2 cm into the ileum.  The                            colonoscopy was performed without difficulty. The                            patient tolerated the procedure well. The quality                            of the bowel preparation was good. The terminal                            ileum, ileocecal valve, appendiceal orifice, and                            rectum were photographed. Scope In: 1:36:34 PM Scope Out: 1:51:02 PM Scope Withdrawal Time: 0 hours 8 minutes 35 seconds  Total Procedure Duration: 0 hours 14 minutes 28 seconds  Findings:                 A few small-mouthed diverticula were found in the                            sigmoid colon.                           Non-bleeding internal hemorrhoids were found during                            retroflexion. The hemorrhoids were small and Grade                            I (internal hemorrhoids that do not prolapse).                           The terminal ileum appeared normal.                           The exam was otherwise without abnormality on  direct and retroflexion views. Complications:            No immediate complications. Estimated Blood Loss:     Estimated blood loss: none. Impression:               - Mild sigmoid diverticulosis.                           - Non-bleeding internal hemorrhoids.                           - The examined portion of the ileum was normal.                           - The examination was otherwise normal on direct                            and retroflexion views.                           - No specimens collected. Recommendation:           - Patient has a contact number available for                            emergencies. The signs and symptoms of potential                            delayed complications were discussed with the                            patient. Return to normal activities tomorrow.                            Written discharge instructions were provided to the                             patient.                           - High fiber diet.                           - Continue present medications.                           - Repeat colonoscopy in 10 years for screening                            purposes. Earlier, if with any new problems or                            change in family history.                           - The findings and recommendations were discussed  with the patient's family. Lynann Bologna, MD 09/13/2023 1:55:11 PM This report has been signed electronically.

## 2023-09-16 ENCOUNTER — Telehealth: Payer: Self-pay | Admitting: *Deleted

## 2023-09-16 NOTE — Telephone Encounter (Signed)
  Follow up Call-     09/13/2023   12:39 PM  Call back number  Post procedure Call Back phone  # [phone]  Permission to leave phone message No     Patient questions:  Do you have a fever, pain , or abdominal swelling? No. Pain Score  0 *  Have you tolerated food without any problems? Yes.    Have you been able to return to your normal activities? Yes.    Do you have any questions about your discharge instructions: Diet   No. Medications  No. Follow up visit  no  Do you have questions or concerns about your Care? No.  Actions: * If pain score is 4 or above: No action needed, pain <4.

## 2023-09-27 DIAGNOSIS — Z124 Encounter for screening for malignant neoplasm of cervix: Secondary | ICD-10-CM | POA: Diagnosis not present

## 2023-09-27 DIAGNOSIS — Z1151 Encounter for screening for human papillomavirus (HPV): Secondary | ICD-10-CM | POA: Diagnosis not present

## 2023-09-27 DIAGNOSIS — Z01419 Encounter for gynecological examination (general) (routine) without abnormal findings: Secondary | ICD-10-CM | POA: Diagnosis not present

## 2023-10-03 DIAGNOSIS — Z1231 Encounter for screening mammogram for malignant neoplasm of breast: Secondary | ICD-10-CM | POA: Diagnosis not present

## 2023-10-18 DIAGNOSIS — L710 Perioral dermatitis: Secondary | ICD-10-CM | POA: Diagnosis not present

## 2023-10-30 ENCOUNTER — Ambulatory Visit: Payer: Federal, State, Local not specified - PPO | Admitting: Internal Medicine

## 2023-11-02 ENCOUNTER — Other Ambulatory Visit: Payer: Self-pay | Admitting: Internal Medicine

## 2023-11-06 ENCOUNTER — Encounter: Payer: Self-pay | Admitting: Internal Medicine

## 2023-11-06 ENCOUNTER — Ambulatory Visit: Payer: Federal, State, Local not specified - PPO | Admitting: Internal Medicine

## 2023-11-06 VITALS — BP 120/68 | HR 83 | Temp 98.2°F | Ht 62.0 in | Wt 175.0 lb

## 2023-11-06 DIAGNOSIS — E66811 Obesity, class 1: Secondary | ICD-10-CM | POA: Diagnosis not present

## 2023-11-06 DIAGNOSIS — G4700 Insomnia, unspecified: Secondary | ICD-10-CM

## 2023-11-06 DIAGNOSIS — I10 Essential (primary) hypertension: Secondary | ICD-10-CM | POA: Diagnosis not present

## 2023-11-06 DIAGNOSIS — M542 Cervicalgia: Secondary | ICD-10-CM | POA: Diagnosis not present

## 2023-11-06 MED ORDER — CYCLOBENZAPRINE HCL 5 MG PO TABS
5.0000 mg | ORAL_TABLET | Freq: Every evening | ORAL | 2 refills | Status: AC | PRN
Start: 2023-11-06 — End: ?

## 2023-11-06 MED ORDER — SAXENDA 18 MG/3ML ~~LOC~~ SOPN
PEN_INJECTOR | SUBCUTANEOUS | 5 refills | Status: DC
Start: 2023-11-06 — End: 2023-11-25

## 2023-11-06 NOTE — Assessment & Plan Note (Signed)
Refractory Try Flexeril at hs

## 2023-11-06 NOTE — Assessment & Plan Note (Signed)
R side - MSK Blue-Emu cream was recommended to use 2-3 times a day Flexeril 5-10 mg at hs prn

## 2023-11-06 NOTE — Progress Notes (Signed)
Subjective:  Patient ID: Sydney [name], female    DOB: 06-Aug-1966  Age: 57 y.o. MRN: [id number]  CC: Medical Management of Chronic Issues   HPI  Sydney [name] presents for HTN, neck pain, insomnia  Outpatient Medications Prior to Visit  Medication Sig Dispense Refill   chlorhexidine (HIBICLENS) 4 % external liquid Apply topically daily as needed. 473 mL 0   Cholecalciferol (VITAMIN D3) 50 MCG (2000 UT) capsule Take 1 capsule (2,000 Units total) by mouth daily. 100 capsule 3   clindamycin (CLEOCIN T) 1 % lotion Apply topically 2 (two) times daily. On face rash 60 mL 0   Cyanocobalamin (B-12) 1000 MCG TABS Take by mouth.     doxycycline (VIBRA-TABS) 100 MG tablet Take 1 tablet (100 mg total) by mouth 2 (two) times daily. 60 tablet 2   Fe Bisgly-Succ-C-Thre-B12-FA (IRON 21/7 PO) Take by mouth.     hydrochlorothiazide (HYDRODIURIL) 25 MG tablet Take 1 tablet by mouth once daily 90 tablet 0   Ivermectin 1 % CREA SMARTSIG:sparingly Topical Daily     losartan (COZAAR) 100 MG tablet Take 1 tablet by mouth once daily 90 tablet 0   Pyridoxine HCl (B-6 PO) Take by mouth.     phentermine (ADIPEX-P) 37.5 MG tablet Take 1 tablet (37.5 mg total) by mouth every morning. 90 tablet 0   escitalopram (LEXAPRO) 5 MG tablet Take 1 tablet (5 mg total) by mouth daily. (Patient not taking: Reported on 08/23/2023) 90 tablet 1   finasteride (PROSCAR) 5 MG tablet 1/4 tab daily for hair loss (Patient not taking: Reported on 08/23/2023) 30 tablet 3   QUEtiapine (SEROQUEL) 100 MG tablet Take 1 tablet (100 mg total) by mouth at bedtime. For insomnia (Patient not taking: Reported on 08/23/2023) 30 tablet 5   No facility-administered medications prior to visit.    ROS: Review of Systems  Constitutional:  Negative for activity change, appetite change, chills, fatigue and unexpected weight change.  HENT:  Negative for congestion, mouth sores and sinus pressure.   Eyes:  Negative for visual disturbance.   Respiratory:  Negative for cough and chest tightness.   Gastrointestinal:  Negative for abdominal pain and nausea.  Genitourinary:  Negative for difficulty urinating, frequency and vaginal pain.  Musculoskeletal:  Negative for back pain and gait problem.  Skin:  Negative for pallor and rash.  Neurological:  Negative for dizziness, tremors, weakness, numbness and headaches.  Psychiatric/Behavioral:  Positive for sleep disturbance. Negative for confusion.     Objective:  BP 120/68 (BP Location: Left Arm, Patient Position: Sitting, Cuff Size: Normal)   Pulse 83   Temp 98.2 F (36.8 C) (Oral)   Ht 5\' 2"  (1.575 m)   Wt 175 lb (79.4 kg)   LMP 05/04/2012   SpO2 94%   BMI 32.01 kg/m   BP Readings from Last 3 Encounters:  11/06/23 120/68  09/13/23 124/87  08/13/23 116/84    Wt Readings from Last 3 Encounters:  11/06/23 175 lb (79.4 kg)  09/13/23 171 lb (77.6 kg)  08/23/23 171 lb (77.6 kg)    Physical Exam Constitutional:      General: She is not in acute distress.    Appearance: Normal appearance. She is well-developed.  HENT:     Head: Normocephalic.     Right Ear: External ear normal.     Left Ear: External ear normal.     Nose: Nose normal.  Eyes:     General:        Right eye:  No discharge.        Left eye: No discharge.     Conjunctiva/sclera: Conjunctivae normal.     Pupils: Pupils are equal, round, and reactive to light.  Neck:     Thyroid: No thyromegaly.     Vascular: No JVD.     Trachea: No tracheal deviation.  Cardiovascular:     Rate and Rhythm: Normal rate and regular rhythm.     Heart sounds: Normal heart sounds.  Pulmonary:     Effort: No respiratory distress.     Breath sounds: No stridor. No wheezing.  Abdominal:     General: Bowel sounds are normal. There is no distension.     Palpations: Abdomen is soft. There is no mass.     Tenderness: There is no abdominal tenderness. There is no guarding or rebound.  Musculoskeletal:        General: No  tenderness.     Cervical back: Normal range of motion and neck supple. No rigidity.  Lymphadenopathy:     Cervical: No cervical adenopathy.  Skin:    Findings: No erythema or rash.  Neurological:     Mental Status: She is oriented to person, place, and time.     Cranial Nerves: No cranial nerve deficit.     Motor: No abnormal muscle tone.     Coordination: Coordination normal.     Deep Tendon Reflexes: Reflexes normal.  Psychiatric:        Behavior: Behavior normal.        Thought Content: Thought content normal.        Judgment: Judgment normal.     Lab Results  Component Value Date   WBC 4.4 03/27/2023   HGB 13.1 03/27/2023   HCT 38.6 03/27/2023   PLT 287.0 03/27/2023   GLUCOSE 98 03/27/2023   CHOL 186 01/26/2021   TRIG 211.0 (H) 01/26/2021   HDL 61.80 01/26/2021   LDLDIRECT 97.0 01/26/2021   LDLCALC 67 03/24/2018   ALT 16 03/27/2023   AST 15 03/27/2023   NA 140 03/27/2023   K 3.4 (L) 03/27/2023   CL 99 03/27/2023   CREATININE 0.83 03/27/2023   BUN 15 03/27/2023   CO2 34 (H) 03/27/2023   TSH 1.18 03/27/2023   HGBA1C 5.9 01/30/2022    CT CARDIAC SCORING (SELF PAY ONLY)  Addendum Date: 05/12/2021   ADDENDUM REPORT: 05/12/2021 21:34 CLINICAL DATA:  Cardiovascular disease risk stratification EXAM: CT Coronary Calcium Score TECHNIQUE: A gated, non-contrast computed tomography scan of the heart was performed using 3mm slice thickness. Axial images were analyzed on a dedicated workstation. Calcium scoring of the coronary arteries was performed using the Agatston method. FINDINGS: Coronary Calcium Score: Left main: 0 Left anterior descending artery: 0 Left circumflex artery: 0 Right coronary artery: 0 Total: 0 Pericardium: Normal. Ascending Aorta: Not well visualized for measurement. Non-cardiac: See separate report from [facility]. IMPRESSION: Coronary calcium score of 0. RECOMMENDATIONS: Coronary artery calcium (CAC) score is a strong predictor of incident coronary  heart disease (CHD) and provides predictive information beyond traditional risk factors. CAC scoring is reasonable to use in the decision to withhold, postpone, or initiate statin therapy in intermediate-risk or selected borderline-risk asymptomatic adults (age 59-75 years and LDL-C >=70 to <190 mg/dL) who do not have diabetes or established atherosclerotic cardiovascular disease (ASCVD).* In intermediate-risk (10-year ASCVD risk >=7.5% to <20%) adults or selected borderline-risk (10-year ASCVD risk >=5% to <7.5%) adults in whom a CAC score is measured for the purpose of making a  treatment decision the following recommendations have been made: If CAC=0, it is reasonable to withhold statin therapy and reassess in 5 to 10 years, as long as higher risk conditions are absent (diabetes mellitus, family history of premature CHD in first degree relatives (males <55 years; females <65 years), cigarette smoking, or LDL >=190 mg/dL). If CAC is 1 to 99, it is reasonable to initiate statin therapy for patients >=21 years of age. If CAC is >=100 or >=75th percentile, it is reasonable to initiate statin therapy at any age. Cardiology referral should be considered for patients with CAC scores >=400 or >=75th percentile. *2018 AHA/ACC/AACVPR/AAPA/ABC/ACPM/ADA/AGS/APhA/ASPC/NLA/PCNA Guideline on the Management of Blood Cholesterol: A Report of the American College of Cardiology/American Heart Association Task Force on Clinical Practice Guidelines. J Am Coll Cardiol. 2019;73(24):3168-3209. Electronically Signed   By: Weston Brass   On: 05/12/2021 21:34   Result Date: 05/12/2021 EXAM: OVER-READ INTERPRETATION  CT CHEST The following report is an over-read performed by radiologist Dr. Jeronimo Greaves of [facility] on 05/12/2021. This over-read does not include interpretation of cardiac or coronary anatomy or pathology. The calcium score interpretation by the cardiologist is attached. COMPARISON:  Chest radiograph  07/30/2007 FINDINGS: Vascular: Normal aortic caliber. Mediastinum/Nodes: No imaged thoracic adenopathy. Lungs/Pleura: No pleural fluid. Lingular scarring or subsegmental atelectasis. Upper Abdomen: Normal imaged portions of the liver, spleen, stomach. Musculoskeletal: No acute osseous abnormality. Upper to midthoracic spondylosis. IMPRESSION: No acute findings in the imaged extracardiac chest. Electronically Signed: By: Jeronimo Greaves M.D. On: 05/12/2021 16:23    Assessment & Plan:   Problem List Items Addressed This Visit     Essential hypertension   Cont w/ Loartan and HCTZ      NECK PAIN - Primary   R side - MSK Blue-Emu cream was recommended to use 2-3 times a day Flexeril 5-10 mg at hs prn       Insomnia   Refractory Try Flexeril at hs      Obesity (BMI 30.0-34.9)   BMI 32 Will use Saxenda if covered         Meds ordered this encounter  Medications   cyclobenzaprine (FLEXERIL) 5 MG tablet    Sig: Take 1-2 tablets (5-10 mg total) by mouth at bedtime as needed for muscle spasms.    Dispense:  60 tablet    Refill:  2   Liraglutide -Weight Management (SAXENDA) 18 MG/3ML SOPN    Sig: Titrate up as directed. Use daily    Dispense:  3 mL    Refill:  5    BMI 32, HTN      Follow-up: Return in about 3 months (around 02/04/2024) for a follow-up visit.  Sonda Primes, MD

## 2023-11-06 NOTE — Assessment & Plan Note (Signed)
Cont w/ Loartan and HCTZ

## 2023-11-08 DIAGNOSIS — Z713 Dietary counseling and surveillance: Secondary | ICD-10-CM | POA: Diagnosis not present

## 2023-11-19 ENCOUNTER — Telehealth: Payer: Self-pay | Admitting: Internal Medicine

## 2023-11-19 NOTE — Telephone Encounter (Signed)
Patient dropped off document Medical Information and Restriction Assessment, to be filled out by provider. Patient requested to send it back via Call Patient to pick up within ASAP. Document is located in providers tray at front office.Please advise at [facility] [phone]

## 2023-11-19 NOTE — Telephone Encounter (Signed)
Patient was seen on 11/06/23 and at the time was waiting on the pharmacy to contact her regarding Liraglutide -Weight Management (SAXENDA) 18 MG/3ML SOPN . States that the pharmacy has sent over paperwork regarding what the dosage is for the medication, as her insurance needs the information, and they have not received anything back. Patient is upset and feels like the ball is being dropped on our end due to no communication with the pharmacy and no one contacting her to let her know what is going on and she has been without the medication for 2 weeks. She would like a call back at [phone].

## 2023-11-21 DIAGNOSIS — E66811 Obesity, class 1: Secondary | ICD-10-CM | POA: Insufficient documentation

## 2023-11-21 NOTE — Assessment & Plan Note (Signed)
BMI 32 Will use Saxenda if covered

## 2023-11-25 ENCOUNTER — Other Ambulatory Visit: Payer: Self-pay | Admitting: Internal Medicine

## 2023-11-25 MED ORDER — SAXENDA 18 MG/3ML ~~LOC~~ SOPN
PEN_INJECTOR | SUBCUTANEOUS | 5 refills | Status: DC
Start: 2023-11-25 — End: 2023-11-28

## 2023-11-26 ENCOUNTER — Telehealth: Payer: Self-pay

## 2023-11-26 NOTE — Telephone Encounter (Signed)
Copied from CRM [id number]. Topic: Clinical - Prescription Issue >> Nov 25, 2023  5:17 PM Corin V wrote:  Reason for CRM: Haniya from [facility] called to get clarification on the maximum daily units allowed on the Liraglutide -Weight Management (SAXENDA) 18 MG/3ML SOPN script. She requested a call back to get clarification to build the insurance case. Call back: [phone]

## 2023-11-28 ENCOUNTER — Telehealth: Payer: Self-pay

## 2023-11-28 ENCOUNTER — Other Ambulatory Visit: Payer: Self-pay | Admitting: Internal Medicine

## 2023-11-28 MED ORDER — SAXENDA 18 MG/3ML ~~LOC~~ SOPN: PEN_INJECTOR | SUBCUTANEOUS | 5 refills | Status: DC

## 2023-11-28 NOTE — Telephone Encounter (Signed)
Copied from CRM [id number]. Topic: Clinical - Medication Refill >> Nov 28, 2023  3:44 PM Raven B wrote: Most Recent Primary Care Visit:  Provider: Tresa Garter  Department: [facility]  Visit Type: OFFICE VISIT  Date: 11/06/2023  Medication:   Has the patient contacted their pharmacy?  (Agent: If no, request that the patient contact the pharmacy for the refill. If patient does not wish to contact the pharmacy document the reason why and proceed with request.) (Agent: If yes, when and what did the pharmacy advise?)  Is this the correct pharmacy for this prescription?  If no, delete pharmacy and type the correct one.  This is the patient's preferred pharmacy:  [facility] [address] [place] - [address] [place] [address] Phone: [phone] Fax: [phone]  [facility] [place], [place], [place] - [address] [place] [address] Phone: [phone] Fax: [phone]  [id number] [place], [place] - [address] AT CORNER OF [facility] [address] [place] [address] Phone: [phone] Fax: [phone]  [id number] [place], [place] - [address] AT [facility] OF [facility] [address] & [facility] [address] [place] [address] Phone: [phone] Fax: [phone]  [id number] [place], [place] - [address] AT [facility] OF [facility] [address] [place] [place] [address] Phone: [phone] Fax: [phone]   Has the prescription been filled recently?   Is the patient out of the medication?   Has the patient been seen for an appointment in the last year OR does the patient have an upcoming appointment?   Can we respond through MyChart?   Agent: Please be advised that Rx refills may take up to 3 business days. We ask that you follow-up with your pharmacy.

## 2023-11-28 NOTE — Telephone Encounter (Unsigned)
Copied from CRM [id number]. Topic: Clinical - Prescription Issue >> Nov 28, 2023  3:22 PM Theodis Sato wrote: Reason for CRM: PT states she has been waiting since 12/6 for Dr. Posey Rea to send dosage information to Pt's pharmacy. PT is very angry and sees no reasons as to why it has been 20 days.

## 2023-11-28 NOTE — Telephone Encounter (Unsigned)
Copied from CRM [id number]. Topic: Clinical - Prescription Issue >> Nov 28, 2023  3:48 PM Raven B wrote: Reason for CRM: PT has been waiting for dosage for Liraglutide -Weight Management (SAXENDA) 18 MG/3ML SOPN to be sent to [facility] pharmacy and the pharmacy still has not received information regarding dosage. PT was originally prescribed this med on 11/06/23. Please include dosage of med within the prescription. PT call back # [phone]

## 2023-11-28 NOTE — Telephone Encounter (Signed)
Haniya from [facility] called to get clarification on the maximum daily units allowed on the Liraglutide -Weight Management (SAXENDA) 18 MG/3ML SOPN script. She requested a call back to get clarification to build the insurance case.

## 2023-11-28 NOTE — Telephone Encounter (Signed)
Done. Thx.

## 2023-11-29 ENCOUNTER — Telehealth: Payer: Self-pay | Admitting: Internal Medicine

## 2023-11-29 NOTE — Telephone Encounter (Signed)
Pt is asking once the original copy of her previous form comes back to [place] if it does to call her so she can receive them as well. Please, Thanks

## 2023-11-29 NOTE — Telephone Encounter (Signed)
The provider has addressed the matter and sent in the medication to [facility].

## 2023-11-29 NOTE — Telephone Encounter (Signed)
Pt has been informed paper work has been completed and placed up front for pick up.

## 2023-12-02 ENCOUNTER — Telehealth: Payer: Self-pay

## 2023-12-02 ENCOUNTER — Other Ambulatory Visit (HOSPITAL_COMMUNITY): Payer: Self-pay

## 2023-12-02 NOTE — Telephone Encounter (Signed)
Pharmacy Patient Advocate Encounter   Received notification from Physician's Office that prior authorization for Saxenda is required/requested.   Insurance verification completed.   The patient is insured through [facility] .   Per test claim: PA required; PA submitted to above mentioned insurance via CoverMyMeds Key/confirmation #/EOC (Key: [id number]) Status is pending

## 2023-12-03 ENCOUNTER — Other Ambulatory Visit (HOSPITAL_COMMUNITY): Payer: Self-pay

## 2023-12-03 NOTE — Telephone Encounter (Signed)
OK. Thx

## 2023-12-03 NOTE — Telephone Encounter (Signed)
 Pharmacy Patient Advocate Encounter  Received notification from [facility] that Prior Authorization for Saxenda  18mg /24ml has been APPROVED from 11/02/23 to 12/01/24   Left a voicemail at [facility] to notify of the approval. Approval letter indexed to media tab.

## 2023-12-05 NOTE — Telephone Encounter (Signed)
 Patient has picked up paperwork.

## 2023-12-27 DIAGNOSIS — L718 Other rosacea: Secondary | ICD-10-CM | POA: Diagnosis not present

## 2023-12-27 DIAGNOSIS — L819 Disorder of pigmentation, unspecified: Secondary | ICD-10-CM | POA: Diagnosis not present

## 2024-01-03 DIAGNOSIS — Z713 Dietary counseling and surveillance: Secondary | ICD-10-CM | POA: Diagnosis not present

## 2024-01-07 ENCOUNTER — Telehealth: Payer: Self-pay | Admitting: Internal Medicine

## 2024-01-07 NOTE — Telephone Encounter (Signed)
 Patient dropped off document  reasonable accomodation paperwork , to be filled out by provider. Patient requested to send it back via Call Patient to pick up within 7-days. Document is located in providers tray at front office.Please advise at Mobile [phone] (mobile)

## 2024-01-10 NOTE — Telephone Encounter (Signed)
 Documents handed to Provider for review and signature.

## 2024-01-15 DIAGNOSIS — R509 Fever, unspecified: Secondary | ICD-10-CM | POA: Diagnosis not present

## 2024-01-15 DIAGNOSIS — R059 Cough, unspecified: Secondary | ICD-10-CM | POA: Diagnosis not present

## 2024-01-15 DIAGNOSIS — J1089 Influenza due to other identified influenza virus with other manifestations: Secondary | ICD-10-CM | POA: Diagnosis not present

## 2024-01-15 DIAGNOSIS — M791 Myalgia, unspecified site: Secondary | ICD-10-CM | POA: Diagnosis not present

## 2024-02-06 ENCOUNTER — Encounter: Payer: Self-pay | Admitting: Internal Medicine

## 2024-02-06 ENCOUNTER — Ambulatory Visit: Payer: Federal, State, Local not specified - PPO | Admitting: Internal Medicine

## 2024-02-06 VITALS — BP 100/60 | HR 68 | Temp 98.7°F | Ht 62.0 in | Wt 174.4 lb

## 2024-02-06 DIAGNOSIS — F418 Other specified anxiety disorders: Secondary | ICD-10-CM | POA: Diagnosis not present

## 2024-02-06 DIAGNOSIS — G4700 Insomnia, unspecified: Secondary | ICD-10-CM | POA: Diagnosis not present

## 2024-02-06 NOTE — Progress Notes (Signed)
 Subjective:  Patient ID: Sydney [name], female    DOB: 1966-08-28  Age: 58 y.o. MRN: [id number]  CC: Medical Management of Chronic Issues (3 month follow up. Last seen for neck pain, insomnia, hypertension. Patient notes neck pain has improved since last visit. Still experiencing the insomnia)   HPI Sydney [name] presents for stress and anxiety  Outpatient Medications Prior to Visit  Medication Sig Dispense Refill   chlorhexidine (HIBICLENS) 4 % external liquid Apply topically daily as needed. 473 mL 0   Cholecalciferol (VITAMIN D3) 50 MCG (2000 UT) capsule Take 1 capsule (2,000 Units total) by mouth daily. 100 capsule 3   clindamycin (CLEOCIN T) 1 % lotion Apply topically 2 (two) times daily. On face rash 60 mL 0   Cyanocobalamin (B-12) 1000 MCG TABS Take by mouth.     cyclobenzaprine (FLEXERIL) 5 MG tablet Take 1-2 tablets (5-10 mg total) by mouth at bedtime as needed for muscle spasms. 60 tablet 2   doxycycline (VIBRA-TABS) 100 MG tablet Take 1 tablet (100 mg total) by mouth 2 (two) times daily. 60 tablet 2   Fe Bisgly-Succ-C-Thre-B12-FA (IRON 21/7 PO) Take by mouth.     hydrochlorothiazide (HYDRODIURIL) 25 MG tablet Take 1 tablet by mouth once daily 90 tablet 0   Ivermectin 1 % CREA SMARTSIG:sparingly Topical Daily     Liraglutide -Weight Management (SAXENDA) 18 MG/3ML SOPN Start 0.6 mg per day for 1 week;  increase the dose by 0.6 mg each week until the full maintenance dose of 3 mg is reached. 3 mL 5   losartan (COZAAR) 100 MG tablet Take 1 tablet by mouth once daily 90 tablet 0   Pyridoxine HCl (B-6 PO) Take by mouth.     escitalopram (LEXAPRO) 5 MG tablet Take 1 tablet (5 mg total) by mouth daily. (Patient not taking: Reported on 02/06/2024) 90 tablet 1   finasteride (PROSCAR) 5 MG tablet 1/4 tab daily for hair loss (Patient not taking: Reported on 02/06/2024) 30 tablet 3   QUEtiapine (SEROQUEL) 100 MG tablet Take 1 tablet (100 mg total) by mouth at bedtime. For insomnia (Patient  not taking: Reported on 02/06/2024) 30 tablet 5   No facility-administered medications prior to visit.    ROS: Review of Systems  Constitutional:  Positive for fatigue. Negative for diaphoresis and unexpected weight change.  Hematological:  Does not bruise/bleed easily.  Psychiatric/Behavioral:  Positive for behavioral problems, decreased concentration, dysphoric mood and sleep disturbance. Negative for agitation, confusion, hallucinations, self-injury and suicidal ideas. The patient is nervous/anxious. The patient is not hyperactive.     Objective:  BP 100/60   Pulse 68   Temp 98.7 F (37.1 C)   Ht 5\' 2"  (1.575 m)   Wt 174 lb 6.4 oz (79.1 kg)   LMP 05/04/2012   SpO2 96%   BMI 31.90 kg/m   BP Readings from Last 3 Encounters:  02/06/24 100/60  11/06/23 120/68  09/13/23 124/87    Wt Readings from Last 3 Encounters:  02/06/24 174 lb 6.4 oz (79.1 kg)  11/06/23 175 lb (79.4 kg)  09/13/23 171 lb (77.6 kg)    Physical Exam Constitutional:      Appearance: Normal appearance.  Neurological:     Mental Status: She is oriented to person, place, and time. Mental status is at baseline.       A total time of 25 minutes was spent preparing to see the patient, reviewing tests, x-rays, other medical records.  Also, obtaining history and documenting clinical information.  We filled out USPS medical information and restriction form.  The patient is able to work from home.  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  Lab Results  Component Value Date   WBC 4.4 03/27/2023   HGB 13.1 03/27/2023   HCT 38.6 03/27/2023   PLT 287.0 03/27/2023   GLUCOSE 98 03/27/2023   CHOL 186 01/26/2021   TRIG 211.0 (H) 01/26/2021   HDL 61.80 01/26/2021   LDLDIRECT 97.0 01/26/2021   LDLCALC 67 03/24/2018   ALT 16 03/27/2023   AST 15 03/27/2023   NA 140 03/27/2023   K 3.4 (L) 03/27/2023   CL 99 03/27/2023   CREATININE 0.83  03/27/2023   BUN 15 03/27/2023   CO2 34 (H) 03/27/2023   TSH 1.18 03/27/2023   HGBA1C 5.9 01/30/2022    CT CARDIAC SCORING (SELF PAY ONLY) Addendum Date: 05/12/2021 ADDENDUM REPORT: 05/12/2021 21:34 CLINICAL DATA:  Cardiovascular disease risk stratification EXAM: CT Coronary Calcium Score TECHNIQUE: A gated, non-contrast computed tomography scan of the heart was performed using 3mm slice thickness. Axial images were analyzed on a dedicated workstation. Calcium scoring of the coronary arteries was performed using the Agatston method. FINDINGS: Coronary Calcium Score: Left main: 0 Left anterior descending artery: 0 Left circumflex artery: 0 Right coronary artery: 0 Total: 0 Pericardium: Normal. Ascending Aorta: Not well visualized for measurement. Non-cardiac: See separate report from [facility]. IMPRESSION: Coronary calcium score of 0. RECOMMENDATIONS: Coronary artery calcium (CAC) score is a strong predictor of incident coronary heart disease (CHD) and provides predictive information beyond traditional risk factors. CAC scoring is reasonable to use in the decision to withhold, postpone, or initiate statin therapy in intermediate-risk or selected borderline-risk asymptomatic adults (age 27-75 years and LDL-C >=70 to <190 mg/dL) who do not have diabetes or established atherosclerotic cardiovascular disease (ASCVD).* In intermediate-risk (10-year ASCVD risk >=7.5% to <20%) adults or selected borderline-risk (10-year ASCVD risk >=5% to <7.5%) adults in whom a CAC score is measured for the purpose of making a treatment decision the following recommendations have been made: If CAC=0, it is reasonable to withhold statin therapy and reassess in 5 to 10 years, as long as higher risk conditions are absent (diabetes mellitus, family history of premature CHD in first degree relatives (males <55 years; females <65 years), cigarette smoking, or LDL >=190 mg/dL). If CAC is 1 to 99, it is reasonable to initiate  statin therapy for patients >=74 years of age. If CAC is >=100 or >=75th percentile, it is reasonable to initiate statin therapy at any age. Cardiology referral should be considered for patients with CAC scores >=400 or >=75th percentile. *2018 AHA/ACC/AACVPR/AAPA/ABC/ACPM/ADA/AGS/APhA/ASPC/NLA/PCNA Guideline on the Management of Blood Cholesterol: A Report of the American College of Cardiology/American Heart Association Task Force on Clinical Practice Guidelines. J Am Coll Cardiol. 2019;73(24):3168-3209. Electronically Signed   By: Weston Brass   On: 05/12/2021 21:34   Result Date: 05/12/2021 EXAM: OVER-READ INTERPRETATION  CT CHEST The following report is an over-read performed by radiologist Dr. Jeronimo Greaves of [facility] on 05/12/2021. This over-read does not include interpretation of cardiac or coronary anatomy or pathology. The calcium score interpretation by the cardiologist is attached. COMPARISON:  Chest radiograph 07/30/2007 FINDINGS: Vascular: Normal aortic caliber. Mediastinum/Nodes: No imaged thoracic adenopathy. Lungs/Pleura: No pleural fluid. Lingular scarring or subsegmental atelectasis. Upper Abdomen: Normal imaged portions of the liver, spleen, stomach. Musculoskeletal: No acute osseous abnormality. Upper to midthoracic spondylosis.  IMPRESSION: No acute findings in the imaged extracardiac chest. Electronically Signed: By: Jeronimo Greaves M.D. On: 05/12/2021 16:23    Assessment & Plan:   Problem List Items Addressed This Visit     Insomnia   We filled out USPS medical information and restriction form.  The patient is able to work from home.  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 Use valerian root, weighted, blankets sauna      Situational anxiety - Primary   We filled out USPS medical information and restriction form.  The patient is able to work from home.  Unable to work at the office due to  anxiety, inability to focus in the presence of multiple other office workers         No orders of the defined types were placed in this encounter.     Follow-up: Return in about 6 months (around 08/08/2024) for Wellness Exam.  Sonda Primes, MD

## 2024-02-07 NOTE — Assessment & Plan Note (Signed)
 We filled out USPS medical information and restriction form.  The patient is able to work from home.  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

## 2024-02-07 NOTE — Assessment & Plan Note (Signed)
 We filled out USPS medical information and restriction form.  The patient is able to work from home.  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 Use valerian root, weighted, blankets sauna

## 2024-02-21 ENCOUNTER — Other Ambulatory Visit: Payer: Self-pay | Admitting: Internal Medicine

## 2024-02-28 DIAGNOSIS — Z713 Dietary counseling and surveillance: Secondary | ICD-10-CM | POA: Diagnosis not present

## 2024-03-03 NOTE — Progress Notes (Unsigned)
 [facility] [address] [place] [address] Phone: [phone] Subjective:   Sydney [name], am serving as a scribe for Dr. Antoine Primas.  I'm seeing this patient by the request  of:  Plotnikov, Georgina Quint, MD  CC: Back pain and wrist pain follow-up  [id number]  03/27/2023 Continues to have signs and symptoms as well as an ultrasound showing the patient does have dilatation of the median nerve that is consistent with carpal tunnel. We discussed with patient in great length. Discussed the possibility of injections which patient declined. Patient would like to know the severity of this or if anything else is potentially going on and would like a nerve conduction test. Will order this to further evaluate the upper extremities and make sure there is no cervical radiculopathy or other peripheral nerve pathology contributing.   Update 03/04/2024 Sydney [name] is a 58 y.o. female coming in with complaint of B wrist pain. Patient states that she has sharp pain intermittently in both wrists.   Also c/o tingling in R trap that goes up into the R side of jaw intermittently as well.     Past Medical History:  Diagnosis Date   Anemia    Anxiety    Chronic kidney disease    Diabetes mellitus without complication (HCC)    borderline diabetic   Hypertension    Past Surgical History:  Procedure Laterality Date   utrine firoid emboliztion     17 years ago   Social History   Socioeconomic History   Marital status: Single    Spouse name: Not on file   Number of children: Not on file   Years of education: Not on file   Highest education level: Not on file  Occupational History   Not on file  Tobacco Use   Smoking status: Never   Smokeless tobacco: Never  Substance and Sexual Activity   Alcohol use: No   Drug use: No   Sexual activity: Not Currently    Birth control/protection: Post-menopausal  Other Topics Concern   Not on file  Social  History Narrative   Not on file   Social Drivers of Health   Financial Resource Strain: Not on file  Food Insecurity: Not on file  Transportation Needs: Not on file  Physical Activity: Not on file  Stress: Not on file  Social Connections: Not on file   Allergies  Allergen Reactions   Shellfish Allergy Hives    Facial urticaria   Enalapril Maleate     REACTION: cough   Family History  Problem Relation Age of Onset   Colon polyps Mother    Heart disease Brother 9       Brother died in 03-22-24- ?MI at 56   Colon cancer Maternal Grandmother    Esophageal cancer Neg Hx    Rectal cancer Neg Hx    Stomach cancer Neg Hx      Current Outpatient Medications (Cardiovascular):    hydrochlorothiazide (HYDRODIURIL) 25 MG tablet, Take 1 tablet by mouth once daily   losartan (COZAAR) 100 MG tablet, Take 1 tablet by mouth once daily    Current Outpatient Medications (Hematological):    Cyanocobalamin (B-12) 1000 MCG TABS, Take by mouth.   Fe Bisgly-Succ-C-Thre-B12-FA (IRON 21/7 PO), Take by mouth.  Current Outpatient Medications (Other):    chlorhexidine (HIBICLENS) 4 % external liquid, Apply topically daily as needed.   Cholecalciferol (VITAMIN D3) 50 MCG (2000 UT) capsule, Take 1 capsule (  2,000 Units total) by mouth daily.   clindamycin (CLEOCIN T) 1 % lotion, Apply topically 2 (two) times daily. On face rash   cyclobenzaprine (FLEXERIL) 5 MG tablet, Take 1-2 tablets (5-10 mg total) by mouth at bedtime as needed for muscle spasms.   doxycycline (VIBRA-TABS) 100 MG tablet, Take 1 tablet (100 mg total) by mouth 2 (two) times daily.   escitalopram (LEXAPRO) 5 MG tablet, Take 1 tablet (5 mg total) by mouth daily.   finasteride (PROSCAR) 5 MG tablet, 1/4 tab daily for hair loss   Ivermectin 1 % CREA, SMARTSIG:sparingly Topical Daily   Liraglutide -Weight Management (SAXENDA) 18 MG/3ML SOPN, Start 0.6 mg per day for 1 week;  increase the dose by 0.6 mg each week until the full  maintenance dose of 3 mg is reached.   Pyridoxine HCl (B-6 PO), Take by mouth.   QUEtiapine (SEROQUEL) 100 MG tablet, Take 1 tablet (100 mg total) by mouth at bedtime. For insomnia   Reviewed prior external information including notes and imaging from  primary care provider As well as notes that were available from care everywhere and other healthcare systems.  Past medical history, social, surgical and family history all reviewed in electronic medical record.  No pertanent information unless stated regarding to the chief complaint.   Review of Systems:  No headache, visual changes, nausea, vomiting, diarrhea, constipation, dizziness, abdominal pain, skin rash, fevers, chills, night sweats, weight loss, swollen lymph nodes, body aches, joint swelling, chest pain, shortness of breath, mood changes. POSITIVE muscle aches  Objective  Blood pressure (!) 128/92, pulse (!) 55, height 5\' 2"  (1.575 m), weight 173 lb (78.5 kg), last menstrual period 05/04/2012, SpO2 99%.   General: No apparent distress alert and oriented x3 mood and affect normal, dressed appropriately.  HEENT: Pupils equal, extraocular movements intact  Respiratory: Patient's speak in full sentences and does not appear short of breath  Cardiovascular: No lower extremity edema, non tender, no erythema  Back does have some mild loss lordosis noted.  Some tightness in the neck with sidebending bilaterally.  Low back does have some positive FABER test but does have improvement in core strength from previous exam  Right and left wrist mild positive Tinel's but seems to be worse left greater than right.   Limited muscular skeletal ultrasound was performed and interpreted by Antoine Primas, M  Limited ultrasound shows that patient does have some dilatation noted of the median nerve left greater than right.  Does have some compression and noted of the retinaculum on the left as well.  Osteopathic findings C2 flexed rotated and side  bent right C4 flexed rotated and side bent left C6 flexed rotated and side bent left T4 extended rotated and side bent right inhaled third rib T9 extended rotated and side bent right L2 flexed rotated and side bent right Sacrum right on right    Impression and Recommendations:    Carpal tunnel syndrome Continues to have some difficulty with the carpal tunnel syndrome.  Patient still wants to hold on any injection.  Encouraged her to go back to more of the bracing again.  Discussed with patient about the stretches and work with Event organiser again.  Differential includes cervical radiculopathy but I think it is highly unlikely.  We discussed icing regimen, home exercises, which activities to do and which ones to avoid.  Increase activity slowly.  Follow-up again in 6 to 8 weeks and if worsening can consider injections.  Lumbar pain Chronic, with mild  exacerbation.  Has responded extremely well to osteopathic manipulation in the past and hopeful that I will make a big difference again.  Discussed icing regimen, home exercises, follow-up again in 6 to 8 weeks otherwise  SI (sacroiliac) joint dysfunction Discussed HEP hip abductor strength       Decision today to treat with OMT was based on Physical Exam  After verbal consent patient was treated with HVLA, ME, FPR techniques in cervical, thoracic, rib, lumbar and sacral areas, all areas are chronic   Patient tolerated the procedure well with improvement in symptoms  Patient given exercises, stretches and lifestyle modifications  See medications in patient instructions if given  Patient will follow up in 4-8 weeks The above documentation has been reviewed and is accurate and complete Judi Saa, DO

## 2024-03-04 ENCOUNTER — Ambulatory Visit: Payer: Self-pay

## 2024-03-04 ENCOUNTER — Ambulatory Visit: Admitting: Internal Medicine

## 2024-03-04 ENCOUNTER — Encounter: Payer: Self-pay | Admitting: Family Medicine

## 2024-03-04 ENCOUNTER — Ambulatory Visit: Admitting: Family Medicine

## 2024-03-04 ENCOUNTER — Encounter: Payer: Self-pay | Admitting: Internal Medicine

## 2024-03-04 VITALS — BP 112/70 | HR 67 | Temp 98.7°F | Ht 62.0 in | Wt 175.2 lb

## 2024-03-04 VITALS — BP 128/92 | HR 55 | Ht 62.0 in | Wt 173.0 lb

## 2024-03-04 DIAGNOSIS — E66812 Obesity, class 2: Secondary | ICD-10-CM

## 2024-03-04 DIAGNOSIS — M9901 Segmental and somatic dysfunction of cervical region: Secondary | ICD-10-CM

## 2024-03-04 DIAGNOSIS — G5601 Carpal tunnel syndrome, right upper limb: Secondary | ICD-10-CM

## 2024-03-04 DIAGNOSIS — F418 Other specified anxiety disorders: Secondary | ICD-10-CM

## 2024-03-04 DIAGNOSIS — M9904 Segmental and somatic dysfunction of sacral region: Secondary | ICD-10-CM

## 2024-03-04 DIAGNOSIS — M25531 Pain in right wrist: Secondary | ICD-10-CM | POA: Diagnosis not present

## 2024-03-04 DIAGNOSIS — Z6835 Body mass index (BMI) 35.0-35.9, adult: Secondary | ICD-10-CM

## 2024-03-04 DIAGNOSIS — M9903 Segmental and somatic dysfunction of lumbar region: Secondary | ICD-10-CM

## 2024-03-04 DIAGNOSIS — M533 Sacrococcygeal disorders, not elsewhere classified: Secondary | ICD-10-CM

## 2024-03-04 DIAGNOSIS — M9902 Segmental and somatic dysfunction of thoracic region: Secondary | ICD-10-CM | POA: Diagnosis not present

## 2024-03-04 DIAGNOSIS — M9908 Segmental and somatic dysfunction of rib cage: Secondary | ICD-10-CM

## 2024-03-04 DIAGNOSIS — M545 Low back pain, unspecified: Secondary | ICD-10-CM

## 2024-03-04 DIAGNOSIS — M25532 Pain in left wrist: Secondary | ICD-10-CM

## 2024-03-04 DIAGNOSIS — G4700 Insomnia, unspecified: Secondary | ICD-10-CM | POA: Diagnosis not present

## 2024-03-04 NOTE — Patient Instructions (Signed)
 Try Gamma-Aminobutyric Acid (GABA)

## 2024-03-04 NOTE — Patient Instructions (Addendum)
 Bracing day and night for 2 weeks then at night for another 2 weeks Exercises  See me again in 8 weeks

## 2024-03-04 NOTE — Assessment & Plan Note (Signed)
 We discussed Topamax

## 2024-03-04 NOTE — Assessment & Plan Note (Addendum)
 Try Gamma-Aminobutyric Acid (GABA) Form was filled out

## 2024-03-04 NOTE — Assessment & Plan Note (Signed)
 Discussed HEP hip abductor strength

## 2024-03-04 NOTE — Progress Notes (Signed)
 I am not yet all worked Sydney [name] and close the working anything so they course paperwork they are asking for discharge from a shift FMLA so I do not know if you know anything about this but I would asking for the list for  Subjective:  Patient ID: Sydney [name], female    DOB: 1966/07/19  Age: 58 y.o. MRN: [id number]  CC: Insomnia (Ongoing insomnia. Notes that when she can get sleep she feels she is sleeping for a bit longer (around an hours worth). Still mostly sleeping in the daytime though. Patient wanting to know if any of the GLP-1 medications also have the ability to help with insomnia)   HPI Sydney [name] presents for insomnia, stress and anxiety  Outpatient Medications Prior to Visit  Medication Sig Dispense Refill   chlorhexidine (HIBICLENS) 4 % external liquid Apply topically daily as needed. 473 mL 0   Cholecalciferol (VITAMIN D3) 50 MCG (2000 UT) capsule Take 1 capsule (2,000 Units total) by mouth daily. 100 capsule 3   clindamycin (CLEOCIN T) 1 % lotion Apply topically 2 (two) times daily. On face rash 60 mL 0   Cyanocobalamin (B-12) 1000 MCG TABS Take by mouth.     cyclobenzaprine (FLEXERIL) 5 MG tablet Take 1-2 tablets (5-10 mg total) by mouth at bedtime as needed for muscle spasms. 60 tablet 2   doxycycline (VIBRA-TABS) 100 MG tablet Take 1 tablet (100 mg total) by mouth 2 (two) times daily. 60 tablet 2   escitalopram (LEXAPRO) 5 MG tablet Take 1 tablet (5 mg total) by mouth daily. 90 tablet 1   Fe Bisgly-Succ-C-Thre-B12-FA (IRON 21/7 PO) Take by mouth.     finasteride (PROSCAR) 5 MG tablet 1/4 tab daily for hair loss 30 tablet 3   hydrochlorothiazide (HYDRODIURIL) 25 MG tablet Take 1 tablet by mouth once daily 90 tablet 3   Ivermectin 1 % CREA SMARTSIG:sparingly Topical Daily     Liraglutide -Weight Management (SAXENDA) 18 MG/3ML SOPN Start 0.6 mg per day for 1 week;  increase the dose by 0.6 mg each week until the full maintenance dose of 3 mg is reached. 3 mL 5   losartan  (COZAAR) 100 MG tablet Take 1 tablet by mouth once daily 90 tablet 3   Pyridoxine HCl (B-6 PO) Take by mouth.     QUEtiapine (SEROQUEL) 100 MG tablet Take 1 tablet (100 mg total) by mouth at bedtime. For insomnia 30 tablet 5   No facility-administered medications prior to visit.    ROS: Review of Systems  Constitutional:  Negative for activity change, appetite change, chills, fatigue and unexpected weight change.  HENT:  Negative for congestion, mouth sores and sinus pressure.   Eyes:  Negative for visual disturbance.  Respiratory:  Negative for cough and chest tightness.   Gastrointestinal:  Negative for abdominal pain and nausea.  Genitourinary:  Negative for difficulty urinating, frequency and vaginal pain.  Musculoskeletal:  Negative for back pain and gait problem.  Skin:  Negative for pallor and rash.  Neurological:  Negative for dizziness, tremors, weakness, numbness and headaches.  Psychiatric/Behavioral:  Positive for decreased concentration, dysphoric mood and sleep disturbance. Negative for confusion, self-injury and suicidal ideas. The patient is nervous/anxious.     Objective:  BP 112/70   Pulse 67   Temp 98.7 F (37.1 C)   Ht 5\' 2"  (1.575 m)   Wt 175 lb 3.2 oz (79.5 kg)   LMP 05/04/2012   SpO2 98%   BMI 32.04 kg/m   BP Readings  from Last 3 Encounters:  03/04/24 112/70  03/04/24 (!) 128/92  02/06/24 100/60    Wt Readings from Last 3 Encounters:  03/04/24 175 lb 3.2 oz (79.5 kg)  03/04/24 173 lb (78.5 kg)  02/06/24 174 lb 6.4 oz (79.1 kg)    Physical Exam Constitutional:      General: She is not in acute distress.    Appearance: Normal appearance. She is well-developed.  HENT:     Head: Normocephalic.     Right Ear: External ear normal.     Left Ear: External ear normal.     Nose: Nose normal.  Eyes:     General:        Right eye: No discharge.        Left eye: No discharge.     Conjunctiva/sclera: Conjunctivae normal.     Pupils: Pupils are  equal, round, and reactive to light.  Neck:     Thyroid: No thyromegaly.     Vascular: No JVD.     Trachea: No tracheal deviation.  Cardiovascular:     Rate and Rhythm: Normal rate and regular rhythm.     Heart sounds: Normal heart sounds.  Pulmonary:     Effort: No respiratory distress.     Breath sounds: No stridor. No wheezing.  Abdominal:     General: Bowel sounds are normal. There is no distension.     Palpations: Abdomen is soft. There is no mass.     Tenderness: There is no abdominal tenderness. There is no guarding or rebound.  Musculoskeletal:        General: No tenderness.     Cervical back: Normal range of motion and neck supple. No rigidity.  Lymphadenopathy:     Cervical: No cervical adenopathy.  Skin:    Findings: No erythema or rash.  Neurological:     Cranial Nerves: No cranial nerve deficit.     Motor: No abnormal muscle tone.     Coordination: Coordination normal.     Deep Tendon Reflexes: Reflexes normal.  Psychiatric:        Behavior: Behavior normal.        Thought Content: Thought content normal.        Judgment: Judgment normal.     Lab Results  Component Value Date   WBC 4.4 03/27/2023   HGB 13.1 03/27/2023   HCT 38.6 03/27/2023   PLT 287.0 03/27/2023   GLUCOSE 98 03/27/2023   CHOL 186 01/26/2021   TRIG 211.0 (H) 01/26/2021   HDL 61.80 01/26/2021   LDLDIRECT 97.0 01/26/2021   LDLCALC 67 03/24/2018   ALT 16 03/27/2023   AST 15 03/27/2023   NA 140 03/27/2023   K 3.4 (L) 03/27/2023   CL 99 03/27/2023   CREATININE 0.83 03/27/2023   BUN 15 03/27/2023   CO2 34 (H) 03/27/2023   TSH 1.18 03/27/2023   HGBA1C 5.9 01/30/2022    CT CARDIAC SCORING (SELF PAY ONLY) Addendum Date: 05/12/2021 ADDENDUM REPORT: 05/12/2021 21:34 CLINICAL DATA:  Cardiovascular disease risk stratification EXAM: CT Coronary Calcium Score TECHNIQUE: A gated, non-contrast computed tomography scan of the heart was performed using 3mm slice thickness. Axial images were  analyzed on a dedicated workstation. Calcium scoring of the coronary arteries was performed using the Agatston method. FINDINGS: Coronary Calcium Score: Left main: 0 Left anterior descending artery: 0 Left circumflex artery: 0 Right coronary artery: 0 Total: 0 Pericardium: Normal. Ascending Aorta: Not well visualized for measurement. Non-cardiac: See separate report from [facility]. IMPRESSION:  Coronary calcium score of 0. RECOMMENDATIONS: Coronary artery calcium (CAC) score is a strong predictor of incident coronary heart disease (CHD) and provides predictive information beyond traditional risk factors. CAC scoring is reasonable to use in the decision to withhold, postpone, or initiate statin therapy in intermediate-risk or selected borderline-risk asymptomatic adults (age 11-75 years and LDL-C >=70 to <190 mg/dL) who do not have diabetes or established atherosclerotic cardiovascular disease (ASCVD).* In intermediate-risk (10-year ASCVD risk >=7.5% to <20%) adults or selected borderline-risk (10-year ASCVD risk >=5% to <7.5%) adults in whom a CAC score is measured for the purpose of making a treatment decision the following recommendations have been made: If CAC=0, it is reasonable to withhold statin therapy and reassess in 5 to 10 years, as long as higher risk conditions are absent (diabetes mellitus, family history of premature CHD in first degree relatives (males <55 years; females <65 years), cigarette smoking, or LDL >=190 mg/dL). If CAC is 1 to 99, it is reasonable to initiate statin therapy for patients >=65 years of age. If CAC is >=100 or >=75th percentile, it is reasonable to initiate statin therapy at any age. Cardiology referral should be considered for patients with CAC scores >=400 or >=75th percentile. *2018 AHA/ACC/AACVPR/AAPA/ABC/ACPM/ADA/AGS/APhA/ASPC/NLA/PCNA Guideline on the Management of Blood Cholesterol: A Report of the American College of Cardiology/American Heart Association Task  Force on Clinical Practice Guidelines. J Am Coll Cardiol. 2019;73(24):3168-3209. Electronically Signed   By: Weston Brass   On: 05/12/2021 21:34   Result Date: 05/12/2021 EXAM: OVER-READ INTERPRETATION  CT CHEST The following report is an over-read performed by radiologist Dr. Jeronimo Greaves of [facility] on 05/12/2021. This over-read does not include interpretation of cardiac or coronary anatomy or pathology. The calcium score interpretation by the cardiologist is attached. COMPARISON:  Chest radiograph 07/30/2007 FINDINGS: Vascular: Normal aortic caliber. Mediastinum/Nodes: No imaged thoracic adenopathy. Lungs/Pleura: No pleural fluid. Lingular scarring or subsegmental atelectasis. Upper Abdomen: Normal imaged portions of the liver, spleen, stomach. Musculoskeletal: No acute osseous abnormality. Upper to midthoracic spondylosis. IMPRESSION: No acute findings in the imaged extracardiac chest. Electronically Signed: By: Jeronimo Greaves M.D. On: 05/12/2021 16:23    Assessment & Plan:   Problem List Items Addressed This Visit     Insomnia - Primary   Try Gamma-Aminobutyric Acid (GABA) We discussed Topamax      Situational anxiety   Try Gamma-Aminobutyric Acid (GABA) Form was filled out      Obesity   We discussed Topamax        FMLA addition was filled out   No orders of the defined types were placed in this encounter.     Follow-up: Return in about 3 months (around 06/03/2024) for a follow-up visit.  Sonda Primes, MD

## 2024-03-04 NOTE — Assessment & Plan Note (Signed)
 Continues to have some difficulty with the carpal tunnel syndrome.  Patient still wants to hold on any injection.  Encouraged her to go back to more of the bracing again.  Discussed with patient about the stretches and work with Event organiser again.  Differential includes cervical radiculopathy but I think it is highly unlikely.  We discussed icing regimen, home exercises, which activities to do and which ones to avoid.  Increase activity slowly.  Follow-up again in 6 to 8 weeks and if worsening can consider injections.

## 2024-03-04 NOTE — Assessment & Plan Note (Signed)
 Chronic, with mild exacerbation.  Has responded extremely well to osteopathic manipulation in the past and hopeful that I will make a big difference again.  Discussed icing regimen, home exercises, follow-up again in 6 to 8 weeks otherwise

## 2024-03-04 NOTE — Assessment & Plan Note (Addendum)
 Try Gamma-Aminobutyric Acid (GABA) We discussed Topamax

## 2024-03-11 DIAGNOSIS — F411 Generalized anxiety disorder: Secondary | ICD-10-CM | POA: Diagnosis not present

## 2024-03-31 DIAGNOSIS — F411 Generalized anxiety disorder: Secondary | ICD-10-CM | POA: Diagnosis not present

## 2024-04-16 DIAGNOSIS — F411 Generalized anxiety disorder: Secondary | ICD-10-CM | POA: Diagnosis not present

## 2024-04-17 NOTE — Progress Notes (Signed)
 [facility] [address] [place] [address] Phone: [phone] Subjective:   IBryan Caprio, am serving as a scribe for Dr. Ronnell Coins.  I'm seeing this patient by the request  of:  Plotnikov, Oakley Bellman, MD  CC: back and neck pain follow up   [id number]  Sydney [name] is a 58 y.o. female coming in with complaint of back and neck pain. OMT on 03/04/2024. Follow up on wrist pain. Patient states sporadic pain in wrist.  Medications patient has been prescribed:   Taking:       Reviewed prior external information including notes and imaging from previsou exam, outside providers and external EMR if available.   As well as notes that were available from care everywhere and other healthcare systems.  Past medical history, social, surgical and family history all reviewed in electronic medical record.  No pertanent information unless stated regarding to the chief complaint.   Past Medical History:  Diagnosis Date   Anemia    Anxiety    Chronic kidney disease    Diabetes mellitus without complication (HCC)    borderline diabetic   Hypertension     Allergies  Allergen Reactions   Shellfish Allergy Hives    Facial urticaria   Enalapril Maleate     REACTION: cough     Review of Systems:  No headache, visual changes, nausea, vomiting, diarrhea, constipation, dizziness, abdominal pain, skin rash, fevers, chills, night sweats, weight loss, swollen lymph nodes, body aches, joint swelling, chest pain, shortness of breath, mood changes. POSITIVE muscle aches  Objective  Blood pressure 124/80, pulse 70, height 5\' 2"  (1.575 m), weight 173 lb (78.5 kg), last menstrual period 05/04/2012, SpO2 96%.   General: No apparent distress alert and oriented x3 mood and affect normal, dressed appropriately.  HEENT: Pupils equal, extraocular movements intact  Respiratory: Patient's speak in full sentences and does not appear short of breath   Cardiovascular: No lower extremity edema, non tender, no erythema  Gait relatively normal MSK:  Back mild loss lordosis.  Significant tightness of the neck noted.  Patient does have some crepitus neck exam does have some mild loss of lordosis.  Patient also has what appears to be some tenderness noted as well over the lateral median nerve positive Tinel's bilaterally.  Osteopathic findings  C6 flexed rotated and side bent left T3 extended rotated and side bent right inhaled rib T6 extended rotated and side bent left L2 flexed rotated and side bent right Sacrum right on right  Limited muscular skeletal ultrasound was performed and interpreted by Ronnell Coins, M   Limited ultrasound shows the patient does have a tight retinaculum at the wrist that is consistent with possible compression of the median nerve.  Hypoechoic changes of the median nerve also noted     Assessment and Plan:  NECK PAIN Continues to respond relatively well to osteopathic manipulation.  Discussed which activities to do and which ones to avoid.  Increase activity slowly.  Discussed icing regimen.  Follow-up again in 6 to 8 weeks.    Nonallopathic problems  Decision today to treat with OMT was based on Physical Exam  After verbal consent patient was treated with HVLA, ME, FPR techniques in cervical, rib, thoracic, lumbar, and sacral  areas  Patient tolerated the procedure well with improvement in symptoms  Patient given exercises, stretches and lifestyle modifications  See medications in patient instructions if given  Patient will follow up in 4-8 weeks  The above documentation has been reviewed and is accurate and complete Hall Birchard M Denny Mccree, DO          Note: This dictation was prepared with Dragon dictation along with smaller phrase technology. Any transcriptional errors that result from this process are unintentional.

## 2024-04-20 ENCOUNTER — Ambulatory Visit: Admitting: Family Medicine

## 2024-04-20 ENCOUNTER — Other Ambulatory Visit: Payer: Self-pay

## 2024-04-20 VITALS — BP 124/80 | HR 70 | Ht 62.0 in | Wt 173.0 lb

## 2024-04-20 DIAGNOSIS — M25532 Pain in left wrist: Secondary | ICD-10-CM

## 2024-04-20 DIAGNOSIS — M9908 Segmental and somatic dysfunction of rib cage: Secondary | ICD-10-CM | POA: Diagnosis not present

## 2024-04-20 DIAGNOSIS — M9901 Segmental and somatic dysfunction of cervical region: Secondary | ICD-10-CM

## 2024-04-20 DIAGNOSIS — M9903 Segmental and somatic dysfunction of lumbar region: Secondary | ICD-10-CM

## 2024-04-20 DIAGNOSIS — G5601 Carpal tunnel syndrome, right upper limb: Secondary | ICD-10-CM

## 2024-04-20 DIAGNOSIS — M25531 Pain in right wrist: Secondary | ICD-10-CM

## 2024-04-20 DIAGNOSIS — M9904 Segmental and somatic dysfunction of sacral region: Secondary | ICD-10-CM | POA: Diagnosis not present

## 2024-04-20 DIAGNOSIS — M542 Cervicalgia: Secondary | ICD-10-CM

## 2024-04-20 DIAGNOSIS — M9902 Segmental and somatic dysfunction of thoracic region: Secondary | ICD-10-CM | POA: Diagnosis not present

## 2024-04-20 NOTE — Assessment & Plan Note (Signed)
 Continues to respond relatively well to osteopathic manipulation.  Discussed which activities to do and which ones to avoid.  Increase activity slowly.  Discussed icing regimen.  Follow-up again in 6 to 8 weeks.

## 2024-04-20 NOTE — Patient Instructions (Signed)
 Good to see you! Let us  know what Friday works for you for wrist injections (ok to double injection only) See you again in 2 months

## 2024-04-20 NOTE — Assessment & Plan Note (Signed)
 Discussed with patient about different treatment options.  We discussed the potential for surgical intervention with the symptoms she is having versus potential injection.  Patient would like to consider the injection but would like to come back at another time to try.

## 2024-05-13 DIAGNOSIS — F411 Generalized anxiety disorder: Secondary | ICD-10-CM | POA: Diagnosis not present

## 2024-05-15 DIAGNOSIS — Z713 Dietary counseling and surveillance: Secondary | ICD-10-CM | POA: Diagnosis not present

## 2024-06-03 ENCOUNTER — Encounter: Payer: Self-pay | Admitting: Internal Medicine

## 2024-06-03 ENCOUNTER — Ambulatory Visit: Admitting: Internal Medicine

## 2024-06-03 VITALS — BP 118/78 | HR 56 | Temp 98.1°F | Ht 62.0 in | Wt 175.0 lb

## 2024-06-03 DIAGNOSIS — G4700 Insomnia, unspecified: Secondary | ICD-10-CM

## 2024-06-03 DIAGNOSIS — R635 Abnormal weight gain: Secondary | ICD-10-CM | POA: Diagnosis not present

## 2024-06-03 DIAGNOSIS — E559 Vitamin D deficiency, unspecified: Secondary | ICD-10-CM

## 2024-06-03 DIAGNOSIS — I10 Essential (primary) hypertension: Secondary | ICD-10-CM

## 2024-06-03 MED ORDER — TIRZEPATIDE-WEIGHT MANAGEMENT 10 MG/0.5ML ~~LOC~~ SOLN: 10.0000 mg | SUBCUTANEOUS | 3 refills | Status: DC

## 2024-06-03 NOTE — Progress Notes (Signed)
 Subjective:  Patient ID: Sydney [name], female    DOB: 1966/06/12  Age: 58 y.o. MRN: [id number]  CC: Medical Management of Chronic Issues (3 mnth f/u)   HPI Rhett Najera presents for obesity, HTN, insomnia  Outpatient Medications Prior to Visit  Medication Sig Dispense Refill   chlorhexidine  (HIBICLENS ) 4 % external liquid Apply topically daily as needed. 473 mL 0   Cholecalciferol (VITAMIN D3) 50 MCG (2000 UT) capsule Take 1 capsule (2,000 Units total) by mouth daily. 100 capsule 3   clindamycin  (CLEOCIN  T) 1 % lotion Apply topically 2 (two) times daily. On face rash 60 mL 0   Cyanocobalamin  (B-12) 1000 MCG TABS Take by mouth.     cyclobenzaprine  (FLEXERIL ) 5 MG tablet Take 1-2 tablets (5-10 mg total) by mouth at bedtime as needed for muscle spasms. 60 tablet 2   doxycycline  (VIBRA -TABS) 100 MG tablet Take 1 tablet (100 mg total) by mouth 2 (two) times daily. 60 tablet 2   escitalopram  (LEXAPRO ) 5 MG tablet Take 1 tablet (5 mg total) by mouth daily. 90 tablet 1   Fe Bisgly-Succ-C-Thre-B12-FA (IRON 21/7 PO) Take by mouth.     finasteride  (PROSCAR ) 5 MG tablet 1/4 tab daily for hair loss 30 tablet 3   hydrochlorothiazide  (HYDRODIURIL ) 25 MG tablet Take 1 tablet by mouth once daily 90 tablet 3   Ivermectin 1 % CREA SMARTSIG:sparingly Topical Daily     losartan  (COZAAR ) 100 MG tablet Take 1 tablet by mouth once daily 90 tablet 3   Pyridoxine  HCl (B-6 PO) Take by mouth.     Liraglutide  -Weight Management (SAXENDA ) 18 MG/3ML SOPN Start 0.6 mg per day for 1 week;  increase the dose by 0.6 mg each week until the full maintenance dose of 3 mg is reached. 3 mL 5   QUEtiapine  (SEROQUEL ) 100 MG tablet Take 1 tablet (100 mg total) by mouth at bedtime. For insomnia 30 tablet 5   No facility-administered medications prior to visit.    ROS: Review of Systems  Constitutional:  Positive for unexpected weight change. Negative for activity change, appetite change, chills and fatigue.  HENT:   Negative for congestion, mouth sores and sinus pressure.   Eyes:  Negative for visual disturbance.  Respiratory:  Negative for cough and chest tightness.   Gastrointestinal:  Negative for abdominal pain and nausea.  Genitourinary:  Negative for difficulty urinating, frequency and vaginal pain.  Musculoskeletal:  Negative for back pain and gait problem.  Skin:  Negative for pallor and rash.  Neurological:  Negative for dizziness, tremors, weakness, numbness and headaches.  Psychiatric/Behavioral:  Positive for sleep disturbance. Negative for confusion and suicidal ideas. The patient is nervous/anxious.     Objective:  BP 118/78   Pulse (!) 56   Temp 98.1 F (36.7 C) (Oral)   Ht 5' 2 (1.575 m)   Wt 175 lb (79.4 kg)   LMP 05/04/2012   SpO2 97%   BMI 32.01 kg/m   BP Readings from Last 3 Encounters:  06/03/24 118/78  04/20/24 124/80  03/04/24 112/70    Wt Readings from Last 3 Encounters:  06/03/24 175 lb (79.4 kg)  04/20/24 173 lb (78.5 kg)  03/04/24 175 lb 3.2 oz (79.5 kg)    Physical Exam Constitutional:      General: She is not in acute distress.    Appearance: She is well-developed. She is obese.  HENT:     Head: Normocephalic.     Right Ear: External ear normal.  Left Ear: External ear normal.     Nose: Nose normal.  Eyes:     General:        Right eye: No discharge.        Left eye: No discharge.     Conjunctiva/sclera: Conjunctivae normal.     Pupils: Pupils are equal, round, and reactive to light.  Neck:     Thyroid : No thyromegaly.     Vascular: No JVD.     Trachea: No tracheal deviation.  Cardiovascular:     Rate and Rhythm: Normal rate and regular rhythm.     Heart sounds: Normal heart sounds.  Pulmonary:     Effort: No respiratory distress.     Breath sounds: No stridor. No wheezing.  Abdominal:     General: Bowel sounds are normal. There is no distension.     Palpations: Abdomen is soft. There is no mass.     Tenderness: There is no abdominal  tenderness. There is no guarding or rebound.  Musculoskeletal:        General: No tenderness.     Cervical back: Normal range of motion and neck supple. No rigidity.  Lymphadenopathy:     Cervical: No cervical adenopathy.  Skin:    Findings: No erythema or rash.  Neurological:     Cranial Nerves: No cranial nerve deficit.     Motor: No abnormal muscle tone.     Coordination: Coordination normal.     Deep Tendon Reflexes: Reflexes normal.  Psychiatric:        Behavior: Behavior normal.        Thought Content: Thought content normal.        Judgment: Judgment normal.     Lab Results  Component Value Date   WBC 4.4 03/27/2023   HGB 13.1 03/27/2023   HCT 38.6 03/27/2023   PLT 287.0 03/27/2023   GLUCOSE 98 03/27/2023   CHOL 186 01/26/2021   TRIG 211.0 (H) 01/26/2021   HDL 61.80 01/26/2021   LDLDIRECT 97.0 01/26/2021   LDLCALC 67 03/24/2018   ALT 16 03/27/2023   AST 15 03/27/2023   NA 140 03/27/2023   K 3.4 (L) 03/27/2023   CL 99 03/27/2023   CREATININE 0.83 03/27/2023   BUN 15 03/27/2023   CO2 34 (H) 03/27/2023   TSH 1.18 03/27/2023   HGBA1C 5.9 01/30/2022    CT CARDIAC SCORING (SELF PAY ONLY) Addendum Date: 05/12/2021 ADDENDUM REPORT: 05/12/2021 21:34 CLINICAL DATA:  Cardiovascular disease risk stratification EXAM: CT Coronary Calcium Score TECHNIQUE: A gated, non-contrast computed tomography scan of the heart was performed using 3mm slice thickness. Axial images were analyzed on a dedicated workstation. Calcium scoring of the coronary arteries was performed using the Agatston method. FINDINGS: Coronary Calcium Score: Left main: 0 Left anterior descending artery: 0 Left circumflex artery: 0 Right coronary artery: 0 Total: 0 Pericardium: Normal. Ascending Aorta: Not well visualized for measurement. Non-cardiac: See separate report from [facility]. IMPRESSION: Coronary calcium score of 0. RECOMMENDATIONS: Coronary artery calcium (CAC) score is a strong predictor of  incident coronary heart disease (CHD) and provides predictive information beyond traditional risk factors. CAC scoring is reasonable to use in the decision to withhold, postpone, or initiate statin therapy in intermediate-risk or selected borderline-risk asymptomatic adults (age 65-75 years and LDL-C >=70 to <190 mg/dL) who do not have diabetes or established atherosclerotic cardiovascular disease (ASCVD).* In intermediate-risk (10-year ASCVD risk >=7.5% to <20%) adults or selected borderline-risk (10-year ASCVD risk >=5% to <7.5%) adults in whom  a CAC score is measured for the purpose of making a treatment decision the following recommendations have been made: If CAC=0, it is reasonable to withhold statin therapy and reassess in 5 to 10 years, as long as higher risk conditions are absent (diabetes mellitus, family history of premature CHD in first degree relatives (males <55 years; females <65 years), cigarette smoking, or LDL >=190 mg/dL). If CAC is 1 to 99, it is reasonable to initiate statin therapy for patients >=53 years of age. If CAC is >=100 or >=75th percentile, it is reasonable to initiate statin therapy at any age. Cardiology referral should be considered for patients with CAC scores >=400 or >=75th percentile. *2018 AHA/ACC/AACVPR/AAPA/ABC/ACPM/ADA/AGS/APhA/ASPC/NLA/PCNA Guideline on the Management of Blood Cholesterol: A Report of the American College of Cardiology/American Heart Association Task Force on Clinical Practice Guidelines. J Am Coll Cardiol. 2019;73(24):3168-3209. Electronically Signed   By: Soyla Merck   On: 05/12/2021 21:34   Result Date: 05/12/2021 EXAM: OVER-READ INTERPRETATION  CT CHEST The following report is an over-read performed by radiologist Dr. Rockey Kilts of [facility] on 05/12/2021. This over-read does not include interpretation of cardiac or coronary anatomy or pathology. The calcium score interpretation by the cardiologist is attached. COMPARISON:  Chest  radiograph 07/30/2007 FINDINGS: Vascular: Normal aortic caliber. Mediastinum/Nodes: No imaged thoracic adenopathy. Lungs/Pleura: No pleural fluid. Lingular scarring or subsegmental atelectasis. Upper Abdomen: Normal imaged portions of the liver, spleen, stomach. Musculoskeletal: No acute osseous abnormality. Upper to midthoracic spondylosis. IMPRESSION: No acute findings in the imaged extracardiac chest. Electronically Signed: By: Rockey Kilts M.D. On: 05/12/2021 16:23    Assessment & Plan:   Problem List Items Addressed This Visit     Essential hypertension - Primary   On HCTZ      Insomnia   Continue with good sleep hygiene.  Working from home helps      Weight gain   Tirzepatide  10 mg Viagra was prescribed for money saving purpose to use 2.5 mg weekly      Vitamin D  deficiency   Continue with vitamin D          Meds ordered this encounter  Medications   tirzepatide  10 MG/0.5ML injection vial    Sig: Inject 10 mg into the skin once a week.    Dispense:  10 mL    Refill:  3      Follow-up: Return for a follow-up visit.  Marolyn Noel, MD

## 2024-06-09 DIAGNOSIS — F411 Generalized anxiety disorder: Secondary | ICD-10-CM | POA: Diagnosis not present

## 2024-06-09 DIAGNOSIS — N760 Acute vaginitis: Secondary | ICD-10-CM | POA: Diagnosis not present

## 2024-06-16 NOTE — Progress Notes (Signed)
 [name] [address] [place] [address] Phone: [phone] Subjective:   Sydney [name], am serving as a scribe for Dr. Arthea Claudene.  I'm seeing this patient by the request  of:  Plotnikov, Sydney GAILS, MD  CC: wrist, neck and back pain   YEP:Dlagzrupcz  Sydney [name] is a 58 y.o. female coming in with complaint of back and neck pain. OMT on 04/20/2024. Alos seen for B wrist pain. Patient states wrist are sore and tender. Tingling on the R side from shoulder into neck.  Medications patient has been prescribed:   Taking:         Reviewed prior external information including notes and imaging from previsou exam, outside providers and external EMR if available.   As well as notes that were available from care everywhere and other healthcare systems.  Past medical history, social, surgical and family history all reviewed in electronic medical record.  No pertanent information unless stated regarding to the chief complaint.   Past Medical History:  Diagnosis Date   Anemia    Anxiety    Chronic kidney disease    Diabetes mellitus without complication (HCC)    borderline diabetic   Hypertension     Allergies  Allergen Reactions   Shellfish Allergy Hives    Facial urticaria   Enalapril Maleate     REACTION: cough     Review of Systems:  No headache, visual changes, nausea, vomiting, diarrhea, constipation, dizziness, abdominal pain, skin rash, fevers, chills, night sweats, weight loss, swollen lymph nodes, body aches, joint swelling, chest pain, shortness of breath, mood changes. POSITIVE muscle aches  Objective  Blood pressure 118/76, pulse 78, height 5' 2 (1.575 m), last menstrual period 05/04/2012, SpO2 99%.   General: No apparent distress alert and oriented x3 mood and affect normal, dressed appropriately.  HEENT: Pupils equal, extraocular movements intact  Respiratory: Patient's speak in full sentences and does not  appear short of breath  Cardiovascular: No lower extremity edema, non tender, no erythema  Gait MSK:  Back does have some loss of lordosis noted.  Neck exam does have some tightness noted.  Seems to be more on the right side.  Osteopathic findings  C2 flexed rotated and side bent right C6 flexed rotated and side bent left T3 extended rotated and side bent right inhaled rib T6 extended rotated and side bent left L2 flexed rotated and side bent right Sacrum right on right       Assessment and Plan:  NECK PAIN Patient does have neck pain.  Discussed icing regimen and home exercises, does seem to be more tightness with the scalenes but will get repeat x-rays to further evaluate for any other changes and bony abnormality that could be potentially contributing.  Discussed icing regimen and home exercises.  Discussed which activities to do and which ones to avoid.  Increase activity slowly.  Follow-up again in 6 to 8 weeks.    Nonallopathic problems  Decision today to treat with OMT was based on Physical Exam  After verbal consent patient was treated with HVLA, ME, FPR techniques in cervical, rib, thoracic, lumbar, and sacral  areas avoided HVLA on the cervical region  Patient tolerated the procedure well with improvement in symptoms  Patient given exercises, stretches and lifestyle modifications  See medications in patient instructions if given  Patient will follow up in 4-8 weeks    The above documentation has been reviewed and is accurate and complete Sydney [name]  Sydney Sharps, DO          Note: This dictation was prepared with Dragon dictation along with smaller phrase technology. Any transcriptional errors that result from this process are unintentional.

## 2024-06-21 ENCOUNTER — Encounter: Payer: Self-pay | Admitting: Internal Medicine

## 2024-06-21 NOTE — Assessment & Plan Note (Signed)
 Continue with vitamin D

## 2024-06-21 NOTE — Assessment & Plan Note (Signed)
 Continue with good sleep hygiene.  Working from home helps

## 2024-06-21 NOTE — Assessment & Plan Note (Signed)
 Tirzepatide  10 mg Viagra was prescribed for money saving purpose to use 2.5 mg weekly

## 2024-06-21 NOTE — Assessment & Plan Note (Signed)
 On HCTZ

## 2024-06-23 ENCOUNTER — Ambulatory Visit: Admitting: Family Medicine

## 2024-06-23 ENCOUNTER — Ambulatory Visit (INDEPENDENT_AMBULATORY_CARE_PROVIDER_SITE_OTHER)

## 2024-06-23 ENCOUNTER — Encounter: Payer: Self-pay | Admitting: Family Medicine

## 2024-06-23 VITALS — BP 118/76 | HR 78 | Ht 62.0 in

## 2024-06-23 DIAGNOSIS — M9908 Segmental and somatic dysfunction of rib cage: Secondary | ICD-10-CM | POA: Diagnosis not present

## 2024-06-23 DIAGNOSIS — M9903 Segmental and somatic dysfunction of lumbar region: Secondary | ICD-10-CM

## 2024-06-23 DIAGNOSIS — M9901 Segmental and somatic dysfunction of cervical region: Secondary | ICD-10-CM

## 2024-06-23 DIAGNOSIS — M9902 Segmental and somatic dysfunction of thoracic region: Secondary | ICD-10-CM

## 2024-06-23 DIAGNOSIS — M542 Cervicalgia: Secondary | ICD-10-CM

## 2024-06-23 DIAGNOSIS — M9904 Segmental and somatic dysfunction of sacral region: Secondary | ICD-10-CM | POA: Diagnosis not present

## 2024-06-23 DIAGNOSIS — R252 Cramp and spasm: Secondary | ICD-10-CM | POA: Diagnosis not present

## 2024-06-23 DIAGNOSIS — M47812 Spondylosis without myelopathy or radiculopathy, cervical region: Secondary | ICD-10-CM | POA: Diagnosis not present

## 2024-06-23 NOTE — Assessment & Plan Note (Signed)
 Patient does have neck pain.  Discussed icing regimen and home exercises, does seem to be more tightness with the scalenes but will get repeat x-rays to further evaluate for any other changes and bony abnormality that could be potentially contributing.  Discussed icing regimen and home exercises.  Discussed which activities to do and which ones to avoid.  Increase activity slowly.  Follow-up again in 6 to 8 weeks.

## 2024-06-23 NOTE — Patient Instructions (Addendum)
 Do prescribed exercises at least 3x a week ASICS Gel-Nimbus See you again in 10-12 weeks

## 2024-06-23 NOTE — Assessment & Plan Note (Signed)
 Describes leg cramping to a certain degree.  This seems with her being in the posterior knee and with the dancing she is doing.  Possibility of popliteal tendinitis.  Discussed icing regimen and home exercises.  Discussed which activities to do in which ones to avoid.  Increase activity slowly otherwise.  Again in 6 to 8 weeks

## 2024-06-26 DIAGNOSIS — L718 Other rosacea: Secondary | ICD-10-CM | POA: Diagnosis not present

## 2024-06-29 ENCOUNTER — Ambulatory Visit: Payer: Self-pay | Admitting: Family Medicine

## 2024-07-14 DIAGNOSIS — F411 Generalized anxiety disorder: Secondary | ICD-10-CM | POA: Diagnosis not present

## 2024-07-17 DIAGNOSIS — Z713 Dietary counseling and surveillance: Secondary | ICD-10-CM | POA: Diagnosis not present

## 2024-08-12 ENCOUNTER — Encounter: Payer: Self-pay | Admitting: Internal Medicine

## 2024-08-12 ENCOUNTER — Ambulatory Visit: Admitting: Internal Medicine

## 2024-08-12 VITALS — BP 118/76 | HR 58 | Temp 98.0°F | Ht 62.0 in | Wt 160.6 lb

## 2024-08-12 DIAGNOSIS — E66812 Obesity, class 2: Secondary | ICD-10-CM | POA: Diagnosis not present

## 2024-08-12 DIAGNOSIS — I10 Essential (primary) hypertension: Secondary | ICD-10-CM

## 2024-08-12 DIAGNOSIS — Z6835 Body mass index (BMI) 35.0-35.9, adult: Secondary | ICD-10-CM

## 2024-08-12 DIAGNOSIS — Z23 Encounter for immunization: Secondary | ICD-10-CM | POA: Diagnosis not present

## 2024-08-12 DIAGNOSIS — G4700 Insomnia, unspecified: Secondary | ICD-10-CM | POA: Diagnosis not present

## 2024-08-12 MED ORDER — PHENTERMINE HCL 37.5 MG PO TABS: 37.5000 mg | ORAL_TABLET | Freq: Every day | ORAL | 2 refills | Status: AC

## 2024-08-12 NOTE — Progress Notes (Signed)
 Subjective:  Patient ID: Sydney [name], female    DOB: Sep 01, 1966  Age: 58 y.o. MRN: [id number]  CC: Follow-up (Patient stated she got Zepbound  and it was too expensive and is asking for something cheaper. Has changed diet. )   HPI Sydney [name] presents for obesity, hypertension, sleep disorder  Outpatient Medications Prior to Visit  Medication Sig Dispense Refill   Cholecalciferol (VITAMIN D3) 50 MCG (2000 UT) capsule Take 1 capsule (2,000 Units total) by mouth daily. 100 capsule 3   Cyanocobalamin  (B-12) 1000 MCG TABS Take by mouth.     Fe Bisgly-Succ-C-Thre-B12-FA (IRON 21/7 PO) Take by mouth.     finasteride  (PROSCAR ) 5 MG tablet 1/4 tab daily for hair loss 30 tablet 3   hydrochlorothiazide  (HYDRODIURIL ) 25 MG tablet Take 1 tablet by mouth once daily 90 tablet 3   losartan  (COZAAR ) 100 MG tablet Take 1 tablet by mouth once daily 90 tablet 3   Pyridoxine  HCl (B-6 PO) Take by mouth.     chlorhexidine  (HIBICLENS ) 4 % external liquid Apply topically daily as needed. (Patient not taking: Reported on 08/12/2024) 473 mL 0   clindamycin  (CLEOCIN  T) 1 % lotion Apply topically 2 (two) times daily. On face rash 60 mL 0   cyclobenzaprine  (FLEXERIL ) 5 MG tablet Take 1-2 tablets (5-10 mg total) by mouth at bedtime as needed for muscle spasms. 60 tablet 2   doxycycline  (VIBRA -TABS) 100 MG tablet Take 1 tablet (100 mg total) by mouth 2 (two) times daily. 60 tablet 2   escitalopram  (LEXAPRO ) 5 MG tablet Take 1 tablet (5 mg total) by mouth daily. 90 tablet 1   Ivermectin 1 % CREA SMARTSIG:sparingly Topical Daily     tirzepatide  10 MG/0.5ML injection vial Inject 10 mg into the skin once a week. (Patient not taking: Reported on 08/12/2024) 10 mL 3   No facility-administered medications prior to visit.    ROS: Review of Systems  Constitutional:  Negative for activity change, appetite change, chills, fatigue and unexpected weight change.  HENT:  Negative for congestion, mouth sores and sinus  pressure.   Eyes:  Negative for visual disturbance.  Respiratory:  Negative for cough and chest tightness.   Gastrointestinal:  Negative for abdominal pain and nausea.  Genitourinary:  Negative for difficulty urinating, frequency and vaginal pain.  Musculoskeletal:  Negative for back pain and gait problem.  Skin:  Negative for pallor and rash.  Neurological:  Negative for dizziness, tremors, weakness, numbness and headaches.  Psychiatric/Behavioral:  Positive for sleep disturbance. Negative for confusion and suicidal ideas. The patient is nervous/anxious.     Objective:  BP 118/76   Pulse (!) 58   Temp 98 F (36.7 C) (Oral)   Ht 5' 2 (1.575 m)   Wt 160 lb 9.6 oz (72.8 kg)   LMP 05/04/2012   SpO2 95%   BMI 29.37 kg/m   BP Readings from Last 3 Encounters:  08/12/24 118/76  06/23/24 118/76  06/03/24 118/78    Wt Readings from Last 3 Encounters:  08/12/24 160 lb 9.6 oz (72.8 kg)  06/03/24 175 lb (79.4 kg)  04/20/24 173 lb (78.5 kg)    Physical Exam Constitutional:      General: She is not in acute distress.    Appearance: She is well-developed.  HENT:     Head: Normocephalic.     Right Ear: External ear normal.     Left Ear: External ear normal.     Nose: Nose normal.  Eyes:  General:        Right eye: No discharge.        Left eye: No discharge.     Conjunctiva/sclera: Conjunctivae normal.     Pupils: Pupils are equal, round, and reactive to light.  Neck:     Thyroid : No thyromegaly.     Vascular: No JVD.     Trachea: No tracheal deviation.  Cardiovascular:     Rate and Rhythm: Normal rate and regular rhythm.     Heart sounds: Normal heart sounds.  Pulmonary:     Effort: No respiratory distress.     Breath sounds: No stridor. No wheezing.  Abdominal:     General: Bowel sounds are normal. There is no distension.     Palpations: Abdomen is soft. There is no mass.     Tenderness: There is no abdominal tenderness. There is no guarding or rebound.   Musculoskeletal:        General: No tenderness.     Cervical back: Normal range of motion and neck supple. No rigidity.  Lymphadenopathy:     Cervical: No cervical adenopathy.  Skin:    Findings: No erythema or rash.  Neurological:     Cranial Nerves: No cranial nerve deficit.     Motor: No abnormal muscle tone.     Coordination: Coordination normal.     Deep Tendon Reflexes: Reflexes normal.  Psychiatric:        Behavior: Behavior normal.        Thought Content: Thought content normal.        Judgment: Judgment normal.     Lab Results  Component Value Date   WBC 4.4 03/27/2023   HGB 13.1 03/27/2023   HCT 38.6 03/27/2023   PLT 287.0 03/27/2023   GLUCOSE 98 03/27/2023   CHOL 186 01/26/2021   TRIG 211.0 (H) 01/26/2021   HDL 61.80 01/26/2021   LDLDIRECT 97.0 01/26/2021   LDLCALC 67 03/24/2018   ALT 16 03/27/2023   AST 15 03/27/2023   NA 140 03/27/2023   K 3.4 (L) 03/27/2023   CL 99 03/27/2023   CREATININE 0.83 03/27/2023   BUN 15 03/27/2023   CO2 34 (H) 03/27/2023   TSH 1.18 03/27/2023   HGBA1C 5.9 01/30/2022    CT CARDIAC SCORING (SELF PAY ONLY) Addendum Date: 05/12/2021 ADDENDUM REPORT: 05/12/2021 21:34 CLINICAL DATA:  Cardiovascular disease risk stratification EXAM: CT Coronary Calcium Score TECHNIQUE: A gated, non-contrast computed tomography scan of the heart was performed using 3mm slice thickness. Axial images were analyzed on a dedicated workstation. Calcium scoring of the coronary arteries was performed using the Agatston method. FINDINGS: Coronary Calcium Score: Left main: 0 Left anterior descending artery: 0 Left circumflex artery: 0 Right coronary artery: 0 Total: 0 Pericardium: Normal. Ascending Aorta: Not well visualized for measurement. Non-cardiac: See separate report from [facility]. IMPRESSION: Coronary calcium score of 0. RECOMMENDATIONS: Coronary artery calcium (CAC) score is a strong predictor of incident coronary heart disease (CHD) and  provides predictive information beyond traditional risk factors. CAC scoring is reasonable to use in the decision to withhold, postpone, or initiate statin therapy in intermediate-risk or selected borderline-risk asymptomatic adults (age 61-75 years and LDL-C >=70 to <190 mg/dL) who do not have diabetes or established atherosclerotic cardiovascular disease (ASCVD).* In intermediate-risk (10-year ASCVD risk >=7.5% to <20%) adults or selected borderline-risk (10-year ASCVD risk >=5% to <7.5%) adults in whom a CAC score is measured for the purpose of making a treatment decision the following recommendations have been  made: If CAC=0, it is reasonable to withhold statin therapy and reassess in 5 to 10 years, as long as higher risk conditions are absent (diabetes mellitus, family history of premature CHD in first degree relatives (males <55 years; females <65 years), cigarette smoking, or LDL >=190 mg/dL). If CAC is 1 to 99, it is reasonable to initiate statin therapy for patients >=50 years of age. If CAC is >=100 or >=75th percentile, it is reasonable to initiate statin therapy at any age. Cardiology referral should be considered for patients with CAC scores >=400 or >=75th percentile. *2018 AHA/ACC/AACVPR/AAPA/ABC/ACPM/ADA/AGS/APhA/ASPC/NLA/PCNA Guideline on the Management of Blood Cholesterol: A Report of the American College of Cardiology/American Heart Association Task Force on Clinical Practice Guidelines. J Am Coll Cardiol. 2019;73(24):3168-3209. Electronically Signed   By: Soyla Merck   On: 05/12/2021 21:34   Result Date: 05/12/2021 EXAM: OVER-READ INTERPRETATION  CT CHEST The following report is an over-read performed by radiologist Dr. Rockey Kilts of [facility] on 05/12/2021. This over-read does not include interpretation of cardiac or coronary anatomy or pathology. The calcium score interpretation by the cardiologist is attached. COMPARISON:  Chest radiograph 07/30/2007 FINDINGS: Vascular:  Normal aortic caliber. Mediastinum/Nodes: No imaged thoracic adenopathy. Lungs/Pleura: No pleural fluid. Lingular scarring or subsegmental atelectasis. Upper Abdomen: Normal imaged portions of the liver, spleen, stomach. Musculoskeletal: No acute osseous abnormality. Upper to midthoracic spondylosis. IMPRESSION: No acute findings in the imaged extracardiac chest. Electronically Signed: By: Rockey Kilts M.D. On: 05/12/2021 16:23    Assessment & Plan:   Problem List Items Addressed This Visit     Essential hypertension   On HCTZ      Insomnia   Continue with good sleep hygiene.  Working from home helps      Obesity   Zepbound  was too expensive I will restart phentermine .  Risks and benefits discussed      Relevant Medications   phentermine  (ADIPEX-P ) 37.5 MG tablet   Other Visit Diagnoses       Immunization due    -  Primary   Relevant Orders   Flu vaccine trivalent PF, 6mos and older(Flulaval,Afluria,Fluarix,Fluzone) (Completed)         Meds ordered this encounter  Medications   phentermine  (ADIPEX-P ) 37.5 MG tablet    Sig: Take 1 tablet (37.5 mg total) by mouth daily before breakfast.    Dispense:  30 tablet    Refill:  2      Follow-up: Return in about 3 months (around 11/11/2024) for a follow-up visit.  Marolyn Noel, MD

## 2024-08-23 NOTE — Assessment & Plan Note (Signed)
 On HCTZ

## 2024-08-23 NOTE — Assessment & Plan Note (Signed)
 Zepbound  was too expensive I will restart phentermine .  Risks and benefits discussed

## 2024-08-23 NOTE — Assessment & Plan Note (Signed)
 Continue with good sleep hygiene.  Working from home helps

## 2024-08-27 NOTE — Progress Notes (Deleted)
  Sydney [name] [address] [place] [address] Phone: [phone] Subjective:    I'm seeing this patient by the request  of:  Plotnikov, Karlynn GAILS, MD  CC:   YEP:Sydney [name]  Sydney [name] is a 58 y.o. female coming in with complaint of back and neck pain. OMT on 06/23/2024. Patient states   Medications patient has been prescribed:   Taking:         Reviewed prior external information including notes and imaging from previsou exam, outside providers and external EMR if available.   As well as notes that were available from care everywhere and other healthcare systems.  Past medical history, social, surgical and family history all reviewed in electronic medical record.  No pertanent information unless stated regarding to the chief complaint.   Past Medical History:  Diagnosis Date   Anemia    Anxiety    Chronic kidney disease    Diabetes mellitus without complication (HCC)    borderline diabetic   Hypertension     Allergies  Allergen Reactions   Shellfish Allergy Hives    Facial urticaria   Patient stated she does not have any allergies   Enalapril Maleate     REACTION: cough     Review of Systems:  No headache, visual changes, nausea, vomiting, diarrhea, constipation, dizziness, abdominal pain, skin rash, fevers, chills, night sweats, weight loss, swollen lymph nodes, body aches, joint swelling, chest pain, shortness of breath, mood changes. POSITIVE muscle aches  Objective  Last menstrual period 05/04/2012.   General: No apparent distress alert and oriented x3 mood and affect normal, dressed appropriately.  HEENT: Pupils equal, extraocular movements intact  Respiratory: Patient's speak in full sentences and does not appear short of breath  Cardiovascular: No lower extremity edema, non tender, no erythema  Gait MSK:  Back   Osteopathic findings  C2 flexed rotated and side bent right C6 flexed rotated and side bent  left T3 extended rotated and side bent right inhaled rib T9 extended rotated and side bent left L2 flexed rotated and side bent right Sacrum right on right       Assessment and Plan:  No problem-specific Assessment & Plan notes found for this encounter.    Nonallopathic problems  Decision today to treat with OMT was based on Physical Exam  After verbal consent patient was treated with HVLA, ME, FPR techniques in cervical, rib, thoracic, lumbar, and sacral  areas  Patient tolerated the procedure well with improvement in symptoms  Patient given exercises, stretches and lifestyle modifications  See medications in patient instructions if given  Patient will follow up in 4-8 weeks             Note: This dictation was prepared with Dragon dictation along with smaller phrase technology. Any transcriptional errors that result from this process are unintentional.

## 2024-09-01 ENCOUNTER — Ambulatory Visit: Admitting: Family Medicine

## 2024-09-03 ENCOUNTER — Ambulatory Visit: Admitting: Internal Medicine

## 2024-09-23 DIAGNOSIS — Z713 Dietary counseling and surveillance: Secondary | ICD-10-CM | POA: Diagnosis not present

## 2024-09-25 NOTE — Progress Notes (Deleted)
  [name] [address] [place] [address] Phone: [phone] Subjective:    I'm seeing this patient by the request  of:  Plotnikov, Karlynn GAILS, MD  CC:   YEP:Dlagzrupcz  Sydney [name] is a 58 y.o. female coming in with complaint of back and neck pain. OMT on 06/23/2024. Patient states   Medications patient has been prescribed:   Taking:         Reviewed prior external information including notes and imaging from previsou exam, outside providers and external EMR if available.   As well as notes that were available from care everywhere and other healthcare systems.  Past medical history, social, surgical and family history all reviewed in electronic medical record.  No pertanent information unless stated regarding to the chief complaint.   Past Medical History:  Diagnosis Date   Anemia    Anxiety    Chronic kidney disease    Diabetes mellitus without complication (HCC)    borderline diabetic   Hypertension     Allergies  Allergen Reactions   Shellfish Allergy Hives    Facial urticaria   Patient stated she does not have any allergies   Enalapril Maleate     REACTION: cough     Review of Systems:  No headache, visual changes, nausea, vomiting, diarrhea, constipation, dizziness, abdominal pain, skin rash, fevers, chills, night sweats, weight loss, swollen lymph nodes, body aches, joint swelling, chest pain, shortness of breath, mood changes. POSITIVE muscle aches  Objective  Last menstrual period 05/04/2012.   General: No apparent distress alert and oriented x3 mood and affect normal, dressed appropriately.  HEENT: Pupils equal, extraocular movements intact  Respiratory: Patient's speak in full sentences and does not appear short of breath  Cardiovascular: No lower extremity edema, non tender, no erythema  Gait MSK:  Back   Osteopathic findings  C2 flexed rotated and side bent right C6 flexed rotated and side bent  left T3 extended rotated and side bent right inhaled rib T9 extended rotated and side bent left L2 flexed rotated and side bent right Sacrum right on right       Assessment and Plan:  No problem-specific Assessment & Plan notes found for this encounter.    Nonallopathic problems  Decision today to treat with OMT was based on Physical Exam  After verbal consent patient was treated with HVLA, ME, FPR techniques in cervical, rib, thoracic, lumbar, and sacral  areas  Patient tolerated the procedure well with improvement in symptoms  Patient given exercises, stretches and lifestyle modifications  See medications in patient instructions if given  Patient will follow up in 4-8 weeks             Note: This dictation was prepared with Dragon dictation along with smaller phrase technology. Any transcriptional errors that result from this process are unintentional.

## 2024-10-01 ENCOUNTER — Ambulatory Visit: Admitting: Family Medicine

## 2024-10-01 DIAGNOSIS — Z6829 Body mass index (BMI) 29.0-29.9, adult: Secondary | ICD-10-CM | POA: Diagnosis not present

## 2024-10-01 DIAGNOSIS — Z1231 Encounter for screening mammogram for malignant neoplasm of breast: Secondary | ICD-10-CM | POA: Diagnosis not present

## 2024-10-01 DIAGNOSIS — Z01419 Encounter for gynecological examination (general) (routine) without abnormal findings: Secondary | ICD-10-CM | POA: Diagnosis not present

## 2024-10-01 DIAGNOSIS — N760 Acute vaginitis: Secondary | ICD-10-CM | POA: Diagnosis not present

## 2024-10-01 DIAGNOSIS — Z124 Encounter for screening for malignant neoplasm of cervix: Secondary | ICD-10-CM | POA: Diagnosis not present

## 2024-10-01 DIAGNOSIS — Z1151 Encounter for screening for human papillomavirus (HPV): Secondary | ICD-10-CM | POA: Diagnosis not present

## 2024-10-28 DIAGNOSIS — N950 Postmenopausal bleeding: Secondary | ICD-10-CM | POA: Diagnosis not present

## 2024-11-05 ENCOUNTER — Encounter: Payer: Self-pay | Admitting: Internal Medicine

## 2024-11-05 ENCOUNTER — Ambulatory Visit: Admitting: Internal Medicine

## 2024-11-05 VITALS — BP 118/86 | HR 61 | Ht 62.0 in | Wt 163.8 lb

## 2024-11-05 DIAGNOSIS — I10 Essential (primary) hypertension: Secondary | ICD-10-CM | POA: Diagnosis not present

## 2024-11-05 DIAGNOSIS — G4700 Insomnia, unspecified: Secondary | ICD-10-CM | POA: Diagnosis not present

## 2024-11-05 DIAGNOSIS — Z0000 Encounter for general adult medical examination without abnormal findings: Secondary | ICD-10-CM | POA: Diagnosis not present

## 2024-11-05 DIAGNOSIS — Z6835 Body mass index (BMI) 35.0-35.9, adult: Secondary | ICD-10-CM

## 2024-11-05 LAB — URINALYSIS
Bilirubin Urine: NEGATIVE
Hgb urine dipstick: NEGATIVE
Leukocytes,Ua: NEGATIVE
Nitrite: NEGATIVE
Specific Gravity, Urine: 1.02 (ref 1.000–1.030)
Total Protein, Urine: NEGATIVE
Urine Glucose: NEGATIVE
Urobilinogen, UA: 1 (ref 0.0–1.0)
pH: 6 (ref 5.0–8.0)

## 2024-11-05 LAB — CBC WITH DIFFERENTIAL/PLATELET
Basophils Absolute: 0 K/uL (ref 0.0–0.1)
Basophils Relative: 0.4 % (ref 0.0–3.0)
Eosinophils Absolute: 0.1 K/uL (ref 0.0–0.7)
Eosinophils Relative: 1.7 % (ref 0.0–5.0)
HCT: 41.3 % (ref 36.0–46.0)
Hemoglobin: 13.7 g/dL (ref 12.0–15.0)
Lymphocytes Relative: 42.7 % (ref 12.0–46.0)
Lymphs Abs: 1.7 K/uL (ref 0.7–4.0)
MCHC: 33.1 g/dL (ref 30.0–36.0)
MCV: 87 fl (ref 78.0–100.0)
Monocytes Absolute: 0.5 K/uL (ref 0.1–1.0)
Monocytes Relative: 12.4 % — ABNORMAL HIGH (ref 3.0–12.0)
Neutro Abs: 1.7 K/uL (ref 1.4–7.7)
Neutrophils Relative %: 42.8 % — ABNORMAL LOW (ref 43.0–77.0)
Platelets: 270 K/uL (ref 150.0–400.0)
RBC: 4.75 Mil/uL (ref 3.87–5.11)
RDW: 13.8 % (ref 11.5–15.5)
WBC: 4 K/uL (ref 4.0–10.5)

## 2024-11-05 LAB — LIPID PANEL
Cholesterol: 181 mg/dL (ref 0–200)
HDL: 81.4 mg/dL (ref >39.00)
LDL Cholesterol: 85 mg/dL (ref 0–99)
NonHDL: 99.69
Total CHOL/HDL Ratio: 2
Triglycerides: 72 mg/dL (ref 0.0–149.0)
VLDL: 14.4 mg/dL (ref 0.0–40.0)

## 2024-11-05 LAB — COMPREHENSIVE METABOLIC PANEL WITH GFR
ALT: 19 U/L (ref 0–35)
AST: 19 U/L (ref 0–37)
Albumin: 4.4 g/dL (ref 3.5–5.2)
Alkaline Phosphatase: 73 U/L (ref 39–117)
BUN: 21 mg/dL (ref 6–23)
CO2: 35 meq/L — ABNORMAL HIGH (ref 19–32)
Calcium: 9.8 mg/dL (ref 8.4–10.5)
Chloride: 100 meq/L (ref 96–112)
Creatinine, Ser: 1.01 mg/dL (ref 0.40–1.20)
GFR: 62.28 mL/min (ref >60.00)
Glucose, Bld: 83 mg/dL (ref 70–99)
Potassium: 4.1 meq/L (ref 3.5–5.1)
Sodium: 141 meq/L (ref 135–145)
Total Bilirubin: 0.4 mg/dL (ref 0.2–1.2)
Total Protein: 7.3 g/dL (ref 6.0–8.3)

## 2024-11-05 LAB — TSH: TSH: 1.27 u[IU]/mL (ref 0.35–5.50)

## 2024-11-05 MED ORDER — PHENOBARBITAL 15 MG PO TABS: 15.0000 mg | ORAL_TABLET | Freq: Every day | ORAL | 5 refills | Status: AC

## 2024-11-05 NOTE — Assessment & Plan Note (Signed)
 Zepbound  was too expensive We can use phentermine .  Risks and benefits discussed

## 2024-11-05 NOTE — Progress Notes (Unsigned)
 Subjective:  Patient ID: Sydney [name], female    DOB: 1966/08/31  Age: 58 y.o. MRN: [id number]  CC: Medical Management of Chronic Issues   HPI Sydney [name] presents for insomnia, stress, HTN  Outpatient Medications Prior to Visit  Medication Sig Dispense Refill   Cholecalciferol (VITAMIN D3) 50 MCG (2000 UT) capsule Take 1 capsule (2,000 Units total) by mouth daily. 100 capsule 3   clindamycin  (CLEOCIN  T) 1 % lotion Apply topically 2 (two) times daily. On face rash 60 mL 0   Cyanocobalamin  (B-12) 1000 MCG TABS Take by mouth.     cyclobenzaprine  (FLEXERIL ) 5 MG tablet Take 1-2 tablets (5-10 mg total) by mouth at bedtime as needed for muscle spasms. 60 tablet 2   escitalopram  (LEXAPRO ) 5 MG tablet Take 1 tablet (5 mg total) by mouth daily. 90 tablet 1   Fe Bisgly-Succ-C-Thre-B12-FA (IRON 21/7 PO) Take by mouth.     finasteride  (PROSCAR ) 5 MG tablet 1/4 tab daily for hair loss 30 tablet 3   hydrochlorothiazide  (HYDRODIURIL ) 25 MG tablet Take 1 tablet by mouth once daily 90 tablet 3   Ivermectin 1 % CREA SMARTSIG:sparingly Topical Daily     losartan  (COZAAR ) 100 MG tablet Take 1 tablet by mouth once daily 90 tablet 3   phentermine  (ADIPEX-P ) 37.5 MG tablet Take 1 tablet (37.5 mg total) by mouth daily before breakfast. 30 tablet 2   Pyridoxine  HCl (B-6 PO) Take by mouth.     doxycycline  (VIBRA -TABS) 100 MG tablet Take 1 tablet (100 mg total) by mouth 2 (two) times daily. 60 tablet 2   chlorhexidine  (HIBICLENS ) 4 % external liquid Apply topically daily as needed. 473 mL 0   tirzepatide  10 MG/0.5ML injection vial Inject 10 mg into the skin once a week. (Patient not taking: Reported on 11/05/2024) 10 mL 3   No facility-administered medications prior to visit.    ROS: Review of Systems  Constitutional:  Negative for activity change, appetite change, chills, fatigue and unexpected weight change.  HENT:  Negative for congestion, mouth sores and sinus pressure.   Eyes:  Negative for  visual disturbance.  Respiratory:  Negative for cough and chest tightness.   Gastrointestinal:  Negative for abdominal pain and nausea.  Genitourinary:  Negative for difficulty urinating, frequency and vaginal pain.  Musculoskeletal:  Negative for back pain and gait problem.  Skin:  Negative for pallor and rash.  Neurological:  Negative for dizziness, tremors, weakness, numbness and headaches.  Psychiatric/Behavioral:  Positive for sleep disturbance. Negative for confusion and suicidal ideas. The patient is not nervous/anxious.     Objective:  BP 118/86   Pulse 61   Ht 5' 2 (1.575 m)   Wt 163 lb 12.8 oz (74.3 kg)   LMP 05/04/2012   SpO2 98%   BMI 29.96 kg/m   BP Readings from Last 3 Encounters:  11/12/24 118/82  11/05/24 118/86  08/12/24 118/76    Wt Readings from Last 3 Encounters:  11/12/24 166 lb (75.3 kg)  11/05/24 163 lb 12.8 oz (74.3 kg)  08/12/24 160 lb 9.6 oz (72.8 kg)    Physical Exam Constitutional:      General: She is not in acute distress.    Appearance: Normal appearance. She is well-developed.  HENT:     Head: Normocephalic.     Right Ear: External ear normal.     Left Ear: External ear normal.     Nose: Nose normal.  Eyes:     General:  Right eye: No discharge.        Left eye: No discharge.     Conjunctiva/sclera: Conjunctivae normal.     Pupils: Pupils are equal, round, and reactive to light.  Neck:     Thyroid : No thyromegaly.     Vascular: No JVD.     Trachea: No tracheal deviation.  Cardiovascular:     Rate and Rhythm: Normal rate and regular rhythm.     Heart sounds: Normal heart sounds.  Pulmonary:     Effort: No respiratory distress.     Breath sounds: No stridor. No wheezing.  Abdominal:     General: Bowel sounds are normal. There is no distension.     Palpations: Abdomen is soft. There is no mass.     Tenderness: There is no abdominal tenderness. There is no guarding or rebound.  Musculoskeletal:        General: No  tenderness.     Cervical back: Normal range of motion and neck supple. No rigidity.  Lymphadenopathy:     Cervical: No cervical adenopathy.  Skin:    Findings: No erythema or rash.  Neurological:     Cranial Nerves: No cranial nerve deficit.     Motor: No abnormal muscle tone.     Coordination: Coordination normal.     Deep Tendon Reflexes: Reflexes normal.  Psychiatric:        Behavior: Behavior normal.        Thought Content: Thought content normal.        Judgment: Judgment normal.     Lab Results  Component Value Date   WBC 4.0 11/05/2024   HGB 13.7 11/05/2024   HCT 41.3 11/05/2024   PLT 270.0 11/05/2024   GLUCOSE 83 11/05/2024   CHOL 181 11/05/2024   TRIG 72.0 11/05/2024   HDL 81.40 11/05/2024   LDLDIRECT 97.0 01/26/2021   LDLCALC 85 11/05/2024   ALT 19 11/05/2024   AST 19 11/05/2024   NA 141 11/05/2024   K 4.1 11/05/2024   CL 100 11/05/2024   CREATININE 1.01 11/05/2024   BUN 21 11/05/2024   CO2 35 (H) 11/05/2024   TSH 1.27 11/05/2024   HGBA1C 5.9 01/30/2022    CT CARDIAC SCORING (SELF PAY ONLY) Addendum Date: 05/12/2021 ADDENDUM REPORT: 05/12/2021 21:34 CLINICAL DATA:  Cardiovascular disease risk stratification EXAM: CT Coronary Calcium Score TECHNIQUE: A gated, non-contrast computed tomography scan of the heart was performed using 3mm slice thickness. Axial images were analyzed on a dedicated workstation. Calcium scoring of the coronary arteries was performed using the Agatston method. FINDINGS: Coronary Calcium Score: Left main: 0 Left anterior descending artery: 0 Left circumflex artery: 0 Right coronary artery: 0 Total: 0 Pericardium: Normal. Ascending Aorta: Not well visualized for measurement. Non-cardiac: See separate report from [facility]. IMPRESSION: Coronary calcium score of 0. RECOMMENDATIONS: Coronary artery calcium (CAC) score is a strong predictor of incident coronary heart disease (CHD) and provides predictive information beyond traditional  risk factors. CAC scoring is reasonable to use in the decision to withhold, postpone, or initiate statin therapy in intermediate-risk or selected borderline-risk asymptomatic adults (age 12-75 years and LDL-C >=70 to <190 mg/dL) who do not have diabetes or established atherosclerotic cardiovascular disease (ASCVD).* In intermediate-risk (10-year ASCVD risk >=7.5% to <20%) adults or selected borderline-risk (10-year ASCVD risk >=5% to <7.5%) adults in whom a CAC score is measured for the purpose of making a treatment decision the following recommendations have been made: If CAC=0, it is reasonable to withhold statin therapy  and reassess in 5 to 10 years, as long as higher risk conditions are absent (diabetes mellitus, family history of premature CHD in first degree relatives (males <55 years; females <65 years), cigarette smoking, or LDL >=190 mg/dL). If CAC is 1 to 99, it is reasonable to initiate statin therapy for patients >=54 years of age. If CAC is >=100 or >=75th percentile, it is reasonable to initiate statin therapy at any age. Cardiology referral should be considered for patients with CAC scores >=400 or >=75th percentile. *2018 AHA/ACC/AACVPR/AAPA/ABC/ACPM/ADA/AGS/APhA/ASPC/NLA/PCNA Guideline on the Management of Blood Cholesterol: A Report of the American College of Cardiology/American Heart Association Task Force on Clinical Practice Guidelines. J Am Coll Cardiol. 2019;73(24):3168-3209. Electronically Signed   By: Soyla Merck   On: 05/12/2021 21:34   Result Date: 05/12/2021 EXAM: OVER-READ INTERPRETATION  CT CHEST The following report is an over-read performed by radiologist Dr. Rockey Kilts of [facility] on 05/12/2021. This over-read does not include interpretation of cardiac or coronary anatomy or pathology. The calcium score interpretation by the cardiologist is attached. COMPARISON:  Chest radiograph 07/30/2007 FINDINGS: Vascular: Normal aortic caliber. Mediastinum/Nodes: No imaged  thoracic adenopathy. Lungs/Pleura: No pleural fluid. Lingular scarring or subsegmental atelectasis. Upper Abdomen: Normal imaged portions of the liver, spleen, stomach. Musculoskeletal: No acute osseous abnormality. Upper to midthoracic spondylosis. IMPRESSION: No acute findings in the imaged extracardiac chest. Electronically Signed: By: Rockey Kilts M.D. On: 05/12/2021 16:23    Assessment & Plan:   Problem List Items Addressed This Visit     Essential hypertension   On HCTZ      Insomnia   Continue with good sleep hygiene.  Working from home helps. Will try Phenobarbital  at hs.  Potential benefits of a long term barbiturates use as well as potential risks and complications (i.e. Somnolence, constipation and others) were explained to the patient and were aknowledged.       Obesity   Zepbound  was too expensive We can use phentermine .  Risks and benefits discussed      Situational anxiety   We tried Lexapro       Well adult exam - Primary   Relevant Medications   PHENObarbital  (LUMINAL) 15 MG tablet   Other Relevant Orders   TSH (Completed)   Urinalysis (Completed)   CBC with Differential/Platelet (Completed)   Lipid panel (Completed)   Comprehensive metabolic panel with GFR (Completed)      Meds ordered this encounter  Medications   PHENObarbital  (LUMINAL) 15 MG tablet    Sig: Take 1-2 tablets (15-30 mg total) by mouth at bedtime.    Dispense:  60 tablet    Refill:  5      Follow-up: Return in about 3 months (around 02/03/2025) for a follow-up visit.  Marolyn Noel, MD

## 2024-11-05 NOTE — Patient Instructions (Signed)
 [facility]

## 2024-11-06 DIAGNOSIS — J3089 Other allergic rhinitis: Secondary | ICD-10-CM | POA: Diagnosis not present

## 2024-11-06 DIAGNOSIS — Z91013 Allergy to seafood: Secondary | ICD-10-CM | POA: Diagnosis not present

## 2024-11-09 ENCOUNTER — Ambulatory Visit: Payer: Self-pay | Admitting: Internal Medicine

## 2024-11-10 NOTE — Progress Notes (Unsigned)
  [name] [address] [place] [address] Phone: [phone] Subjective:    I'm seeing this patient by the request  of:  Plotnikov, Karlynn GAILS, MD  CC:   YEP:Dlagzrupcz  Sydney [name] is a 58 y.o. female coming in with complaint of back and neck pain. OMT on 06/23/2024. Patient states   Medications patient has been prescribed:   Taking:         Reviewed prior external information including notes and imaging from previsou exam, outside providers and external EMR if available.   As well as notes that were available from care everywhere and other healthcare systems.  Past medical history, social, surgical and family history all reviewed in electronic medical record.  No pertanent information unless stated regarding to the chief complaint.   Past Medical History:  Diagnosis Date   Anemia    Anxiety    Chronic kidney disease    Diabetes mellitus without complication (HCC)    borderline diabetic   Hypertension     Allergies  Allergen Reactions   Shellfish Allergy Hives    Facial urticaria   Patient stated she does not have any allergies   Enalapril Maleate     REACTION: cough     Review of Systems:  No headache, visual changes, nausea, vomiting, diarrhea, constipation, dizziness, abdominal pain, skin rash, fevers, chills, night sweats, weight loss, swollen lymph nodes, body aches, joint swelling, chest pain, shortness of breath, mood changes. POSITIVE muscle aches  Objective  Last menstrual period 05/04/2012.   General: No apparent distress alert and oriented x3 mood and affect normal, dressed appropriately.  HEENT: Pupils equal, extraocular movements intact  Respiratory: Patient's speak in full sentences and does not appear short of breath  Cardiovascular: No lower extremity edema, non tender, no erythema  Gait MSK:  Back   Osteopathic findings  C2 flexed rotated and side bent right C6 flexed rotated and side bent  left T3 extended rotated and side bent right inhaled rib T9 extended rotated and side bent left L2 flexed rotated and side bent right Sacrum right on right       Assessment and Plan:  No problem-specific Assessment & Plan notes found for this encounter.    Nonallopathic problems  Decision today to treat with OMT was based on Physical Exam  After verbal consent patient was treated with HVLA, ME, FPR techniques in cervical, rib, thoracic, lumbar, and sacral  areas  Patient tolerated the procedure well with improvement in symptoms  Patient given exercises, stretches and lifestyle modifications  See medications in patient instructions if given  Patient will follow up in 4-8 weeks             Note: This dictation was prepared with Dragon dictation along with smaller phrase technology. Any transcriptional errors that result from this process are unintentional.

## 2024-11-11 ENCOUNTER — Ambulatory Visit: Admitting: Internal Medicine

## 2024-11-12 ENCOUNTER — Encounter: Payer: Self-pay | Admitting: Family Medicine

## 2024-11-12 ENCOUNTER — Ambulatory Visit: Admitting: Family Medicine

## 2024-11-12 VITALS — BP 118/82 | HR 73 | Ht 62.0 in | Wt 166.0 lb

## 2024-11-12 DIAGNOSIS — M9901 Segmental and somatic dysfunction of cervical region: Secondary | ICD-10-CM

## 2024-11-12 DIAGNOSIS — G5603 Carpal tunnel syndrome, bilateral upper limbs: Secondary | ICD-10-CM

## 2024-11-12 DIAGNOSIS — M19049 Primary osteoarthritis, unspecified hand: Secondary | ICD-10-CM

## 2024-11-12 DIAGNOSIS — M9908 Segmental and somatic dysfunction of rib cage: Secondary | ICD-10-CM | POA: Diagnosis not present

## 2024-11-12 DIAGNOSIS — M542 Cervicalgia: Secondary | ICD-10-CM

## 2024-11-12 DIAGNOSIS — M9904 Segmental and somatic dysfunction of sacral region: Secondary | ICD-10-CM | POA: Diagnosis not present

## 2024-11-12 DIAGNOSIS — M9903 Segmental and somatic dysfunction of lumbar region: Secondary | ICD-10-CM | POA: Diagnosis not present

## 2024-11-12 DIAGNOSIS — M9902 Segmental and somatic dysfunction of thoracic region: Secondary | ICD-10-CM

## 2024-11-12 DIAGNOSIS — R200 Anesthesia of skin: Secondary | ICD-10-CM

## 2024-11-12 NOTE — Assessment & Plan Note (Signed)
 Known CMC arthritis but unfortunately patient is having more hand pain.  Does do a lot of repetitive activity and carpal tunnel is within the differential.  Also known moderate arthritic changes of the neck.  Nerve conduction study of the cervical spine ordered at this time.  Discussed which activities to do and which ones to avoid.  Increase activity slowly.  Follow-up again in 6 to 8 weeks.

## 2024-11-12 NOTE — Assessment & Plan Note (Signed)
 Patient has a strong family history.  Nerve conduction study of the bilateral upper extremities.  See if patient depends on severity and if any injections are necessary.  Also has degenerative disc of the cervical spine that can be contributing.

## 2024-11-12 NOTE — Assessment & Plan Note (Signed)
 Chronic degenerative disc disease.  Discussed icing regimen and home exercises.  Discussed which activities to do and which ones to avoid.  Attempted osteopathic manipulation with some benefit.

## 2024-11-12 NOTE — Patient Instructions (Addendum)
 [facility] See me again 2 months

## 2024-11-13 DIAGNOSIS — Z713 Dietary counseling and surveillance: Secondary | ICD-10-CM | POA: Diagnosis not present

## 2024-11-17 DIAGNOSIS — N950 Postmenopausal bleeding: Secondary | ICD-10-CM | POA: Diagnosis not present

## 2024-11-17 NOTE — Assessment & Plan Note (Signed)
 On HCTZ

## 2024-11-17 NOTE — Assessment & Plan Note (Signed)
 We tried Lexapro 

## 2024-11-17 NOTE — Assessment & Plan Note (Signed)
 Continue with good sleep hygiene.  Working from home helps. Will try Phenobarbital  at hs.  Potential benefits of a long term barbiturates use as well as potential risks and complications (i.e. Somnolence, constipation and others) were explained to the patient and were aknowledged.

## 2024-11-19 DIAGNOSIS — F411 Generalized anxiety disorder: Secondary | ICD-10-CM | POA: Diagnosis not present

## 2024-11-20 DIAGNOSIS — R9431 Abnormal electrocardiogram [ECG] [EKG]: Secondary | ICD-10-CM | POA: Diagnosis not present

## 2024-11-20 DIAGNOSIS — R079 Chest pain, unspecified: Secondary | ICD-10-CM | POA: Diagnosis not present

## 2024-12-01 ENCOUNTER — Other Ambulatory Visit: Payer: Self-pay

## 2024-12-01 ENCOUNTER — Encounter: Payer: Self-pay | Admitting: Neurology

## 2024-12-01 DIAGNOSIS — R202 Paresthesia of skin: Secondary | ICD-10-CM

## 2024-12-05 ENCOUNTER — Emergency Department (HOSPITAL_BASED_OUTPATIENT_CLINIC_OR_DEPARTMENT_OTHER)

## 2024-12-05 ENCOUNTER — Other Ambulatory Visit: Payer: Self-pay

## 2024-12-05 ENCOUNTER — Encounter (HOSPITAL_BASED_OUTPATIENT_CLINIC_OR_DEPARTMENT_OTHER): Payer: Self-pay | Admitting: Emergency Medicine

## 2024-12-05 ENCOUNTER — Emergency Department (HOSPITAL_BASED_OUTPATIENT_CLINIC_OR_DEPARTMENT_OTHER)
Admission: EM | Admit: 2024-12-05 | Discharge: 2024-12-05 | Disposition: A | Discharge status: Home / Self Care | Attending: Emergency Medicine | Admitting: Emergency Medicine

## 2024-12-05 DIAGNOSIS — R079 Chest pain, unspecified: Secondary | ICD-10-CM | POA: Insufficient documentation

## 2024-12-05 DIAGNOSIS — Z79899 Other long term (current) drug therapy: Secondary | ICD-10-CM | POA: Diagnosis not present

## 2024-12-05 DIAGNOSIS — M549 Dorsalgia, unspecified: Secondary | ICD-10-CM | POA: Insufficient documentation

## 2024-12-05 DIAGNOSIS — I129 Hypertensive chronic kidney disease with stage 1 through stage 4 chronic kidney disease, or unspecified chronic kidney disease: Secondary | ICD-10-CM | POA: Diagnosis not present

## 2024-12-05 DIAGNOSIS — E1122 Type 2 diabetes mellitus with diabetic chronic kidney disease: Secondary | ICD-10-CM | POA: Insufficient documentation

## 2024-12-05 DIAGNOSIS — N189 Chronic kidney disease, unspecified: Secondary | ICD-10-CM | POA: Insufficient documentation

## 2024-12-05 LAB — CBC WITH DIFFERENTIAL/PLATELET
Abs Immature Granulocytes: 0 K/uL (ref 0.00–0.07)
Basophils Absolute: 0 K/uL (ref 0.0–0.1)
Basophils Relative: 1 %
Eosinophils Absolute: 0.1 K/uL (ref 0.0–0.5)
Eosinophils Relative: 1 %
HCT: 40.7 % (ref 36.0–46.0)
Hemoglobin: 13.7 g/dL (ref 12.0–15.0)
Immature Granulocytes: 0 %
Lymphocytes Relative: 42 %
Lymphs Abs: 1.9 K/uL (ref 0.7–4.0)
MCH: 28.9 pg (ref 26.0–34.0)
MCHC: 33.7 g/dL (ref 30.0–36.0)
MCV: 85.9 fL (ref 80.0–100.0)
Monocytes Absolute: 0.5 K/uL (ref 0.1–1.0)
Monocytes Relative: 11 %
Neutro Abs: 2 K/uL (ref 1.7–7.7)
Neutrophils Relative %: 45 %
Platelets: 281 K/uL (ref 150–400)
RBC: 4.74 MIL/uL (ref 3.87–5.11)
RDW: 12.3 % (ref 11.5–15.5)
WBC: 4.4 K/uL (ref 4.0–10.5)
nRBC: 0 % (ref 0.0–0.2)

## 2024-12-05 LAB — BASIC METABOLIC PANEL WITH GFR
Anion gap: 10 (ref 5–15)
BUN: 19 mg/dL (ref 6–20)
CO2: 30 mmol/L (ref 22–32)
Calcium: 9.7 mg/dL (ref 8.9–10.3)
Chloride: 100 mmol/L (ref 98–111)
Creatinine, Ser: 0.73 mg/dL (ref 0.44–1.00)
GFR, Estimated: >60 mL/min
Glucose, Bld: 99 mg/dL (ref 70–99)
Potassium: 3.7 mmol/L (ref 3.5–5.1)
Sodium: 140 mmol/L (ref 135–145)

## 2024-12-05 LAB — TROPONIN T, HIGH SENSITIVITY: Troponin T High Sensitivity: <15 ng/L (ref 0–19)

## 2024-12-05 MED ORDER — METHOCARBAMOL 500 MG PO TABS: 500.0000 mg | ORAL_TABLET | Freq: Two times a day (BID) | ORAL | 0 refills | Status: AC

## 2024-12-05 NOTE — ED Notes (Signed)
 Discharge instructions reviewed with patient. Patient verbalizes understanding, no further questions at this time. Medications/prescriptions and follow up information provided. No acute distress noted at time of departure.

## 2024-12-05 NOTE — ED Triage Notes (Signed)
 Pt c/o LT CP intermittently x 1 month; pain moves around to shoulder blade, LT side neck and down to LT side abd; denies other sxs associated with this

## 2024-12-05 NOTE — ED Provider Notes (Signed)
 " [facility] Provider Note  CSN: [id number] Arrival date & time: 12/05/24 1555  Chief Complaint(s) Chest Pain  HPI Sydney [name] is a 59 y.o. female who is here today for intermittent chest and back pain over the last 3 weeks.  Patient denies any inciting event, but states she did help someone move some things around but is unsure if this was the cause.  She has not had cough, shortness of breath.  She denies any alleviating or exacerbating factors.  She has been taking NSAIDs at home.   Past Medical History Past Medical History:  Diagnosis Date   Anemia    Anxiety    Chronic kidney disease    Diabetes mellitus without complication (HCC)    borderline diabetic   Hypertension    Patient Active Problem List   Diagnosis Date Noted   Obesity (BMI 30.0-34.9) 11/21/2023   Lumbar pain 03/27/2023   Seafood allergy 12/27/2022   Anemia 12/27/2022   Carpal tunnel syndrome 12/01/2022   Dermatitis 08/20/2021   Family history of early CAD 02/22/2021   Memory problem 02/22/2021   Grief 02/01/2021   Obesity 12/21/2020   Nonallopathic lesion of cervical region 07/26/2020   Abnormal cervical Papanicolaou smear 11/19/2019   Fracture of radial head, right, closed 04/08/2019   GERD (gastroesophageal reflux disease) 01/26/2019   Elbow contusion 01/26/2019   CMC arthritis 10/21/2018   Vitamin D  deficiency 09/22/2018   Cough 02/04/2017   Rash of face 07/23/2016   Well adult exam 06/25/2016   Situational anxiety 04/02/2016   URI (upper respiratory infection) 01/23/2016   Patellofemoral syndrome of both knees 01/03/2016   Weight gain 12/28/2015   Weakness generalized 05/24/2015   Nonallopathic lesion of lumbosacral region 05/16/2015   Nonallopathic lesion of thoracic region 05/16/2015   SI (sacroiliac) joint dysfunction 03/28/2015   Nonallopathic lesion of sacral region 03/28/2015   Impingement syndrome of left shoulder 03/28/2015   Leg  cramping 09/29/2014   Acute meniscal tear of right knee 07/28/2014   Insomnia 03/13/2013   Blurred vision 03/13/2013   NECK PAIN 07/27/2009   CHEST WALL PAIN 07/27/2009   FIBROIDS, UTERUS 11/18/2007   Essential hypertension 11/18/2007   Home Medication(s) Prior to Admission medications  Medication Sig Start Date End Date Taking? Authorizing Provider  methocarbamol  (ROBAXIN ) 500 MG tablet Take 1 tablet (500 mg total) by mouth 2 (two) times daily. 12/05/24  Yes Mannie Pac T, DO  chlorhexidine  (HIBICLENS ) 4 % external liquid Apply topically daily as needed. 08/13/23   Plotnikov, Aleksei V, MD  Cholecalciferol (VITAMIN D3) 50 MCG (2000 UT) capsule Take 1 capsule (2,000 Units total) by mouth daily. 08/15/22   Plotnikov, Aleksei V, MD  clindamycin  (CLEOCIN  T) 1 % lotion Apply topically 2 (two) times daily. On face rash 08/28/23   Plotnikov, Karlynn GAILS, MD  Cyanocobalamin  (B-12) 1000 MCG TABS Take by mouth.    [provider]  cyclobenzaprine  (FLEXERIL ) 5 MG tablet Take 1-2 tablets (5-10 mg total) by mouth at bedtime as needed for muscle spasms. 11/06/23   Plotnikov, Karlynn GAILS, MD  escitalopram  (LEXAPRO ) 5 MG tablet Take 1 tablet (5 mg total) by mouth daily. 08/15/22   Plotnikov, Aleksei V, MD  Fe Bisgly-Succ-C-Thre-B12-FA (IRON 21/7 PO) Take by mouth.    [provider]  finasteride  (PROSCAR ) 5 MG tablet 1/4 tab daily for hair loss 08/17/21   Plotnikov, Karlynn GAILS, MD  hydrochlorothiazide  (HYDRODIURIL ) 25 MG tablet Take 1 tablet by mouth once daily  02/21/24   Plotnikov, Aleksei V, MD  Ivermectin 1 % CREA SMARTSIG:sparingly Topical Daily 10/24/23   [provider]  losartan  (COZAAR ) 100 MG tablet Take 1 tablet by mouth once daily 02/21/24   Plotnikov, Aleksei V, MD  PHENObarbital  (LUMINAL) 15 MG tablet Take 1-2 tablets (15-30 mg total) by mouth at bedtime. 11/05/24   Plotnikov, Aleksei V, MD  phentermine  (ADIPEX-P ) 37.5 MG tablet Take 1 tablet (37.5 mg total) by mouth daily  before breakfast. 08/12/24 11/12/24  Plotnikov, Aleksei V, MD  Pyridoxine  HCl (B-6 PO) Take by mouth.    [provider]                                                                                                                                    Past Surgical History Past Surgical History:  Procedure Laterality Date   utrine firoid emboliztion     17 years ago   Family History Family History  Problem Relation Age of Onset   Colon polyps Mother    Heart disease Brother 58       Brother died in 03/04/25- ?MI at 22   Colon cancer Maternal Grandmother    Esophageal cancer Neg Hx    Rectal cancer Neg Hx    Stomach cancer Neg Hx     Social History Social History[1] Allergies Shellfish allergy and Enalapril maleate  Review of Systems Review of Systems  Physical Exam Vital Signs  I have reviewed the triage vital signs BP (!) 136/92 (BP Location: Right Arm)   Pulse (!) 56   Temp 98.2 F (36.8 C) (Oral)   Resp 16   LMP 05/04/2012   SpO2 99%   Physical Exam Vitals and nursing note reviewed.  Cardiovascular:     Rate and Rhythm: Normal rate and regular rhythm.     Pulses:          Radial pulses are 2+ on the right side and 2+ on the left side.     Heart sounds: Normal heart sounds.  Pulmonary:     Effort: Pulmonary effort is normal.     Breath sounds: No decreased breath sounds or wheezing.  Abdominal:     Palpations: Abdomen is soft.  Musculoskeletal:        General: Normal range of motion.  Skin:    General: Skin is warm and dry.  Neurological:     General: No focal deficit present.     Mental Status: She is alert.     ED Results and Treatments Labs (all labs ordered are listed, but only abnormal results are displayed) Labs Reviewed  BASIC METABOLIC PANEL WITH GFR  CBC WITH DIFFERENTIAL/PLATELET  TROPONIN T, HIGH SENSITIVITY  Radiology DG Chest 2 View Result Date: 12/05/2024 EXAM: 2 VIEW(S) XRAY OF THE CHEST 12/05/2024 04:25:34 PM COMPARISON: Comparison with 07/30/2007. CLINICAL HISTORY: Left chest pain intermittently for 1 month. FINDINGS: LUNGS AND PLEURA: No focal pulmonary opacity. No pleural effusion. No pneumothorax. HEART AND MEDIASTINUM: Heart size and pulmonary vascularity are normal. Mediastinal contours appear intact. BONES AND SOFT TISSUES: No acute osseous abnormality. IMPRESSION: 1. No acute cardiopulmonary abnormality. Electronically signed by: Elsie Gravely MD 12/05/2024 05:01 PM EST RP Workstation: [id number]    Pertinent labs & imaging results that were available during my care of the patient were reviewed by me and considered in my medical decision making (see MDM for details).  Medications Ordered in ED Medications - No data to display                                                                                                                                   Procedures Procedures  (including critical care time)  Medical Decision Making / ED Course   This patient presents to the ED for concern of intermittent chest and back pain, this involves an extensive number of treatment options, and is a complaint that carries with it a high risk of complications and morbidity.  The differential diagnosis includes musculoskeletal pain, less likely ACS, less likely PE, less likely dissection, less likely pleurisy, less likely pneumonia.  MDM: Patient overall looks well.  She has reassuring physical exam.  Her vital signs are normal.  My independent review of the patient's EKG shows no ST segment depressions or elevations, no T wave inversions, no evidence of acute ischemia.  My dependent review the patient's chest x-ray shows no pneumonia.  Patient's I send her troponin negative.  Given that her symptoms have been ongoing for 3 weeks I do not believe you require delta.  Patient seen primarily  concerned that she could be having either a stroke or an MI.  I discussed why these were both low risk with her.  She is going to follow-up with her PCP.  Will send her prescription for muscle relaxer.   Additional history obtained:  -External records from outside source obtained and reviewed including: Chart review including previous notes, labs, imaging, consultation notes   Lab Tests: -I ordered, reviewed, and interpreted labs.   The pertinent results include:   Labs Reviewed  BASIC METABOLIC PANEL WITH GFR  CBC WITH DIFFERENTIAL/PLATELET  TROPONIN T, HIGH SENSITIVITY      EKG my independent review of the patient's EKG shows no ST segment depressions or elevations, no T wave inversions, no evidence of acute ischemia.  EKG Interpretation Date/Time:  Saturday December 05 2024 16:05:36 EST Ventricular Rate:  61 PR Interval:  162 QRS Duration:  93 QT Interval:  442 QTC Calculation: 446 R Axis:   80  Text Interpretation: Sinus rhythm Confirmed by Gravely Pac [phone]) on 12/05/2024 6:27:57 PM  Imaging Studies ordered: I ordered imaging studies including chest x-ray I independently visualized and interpreted imaging. I agree with the radiologist interpretation   Medicines ordered and prescription drug management: Meds ordered this encounter  Medications   methocarbamol  (ROBAXIN ) 500 MG tablet    Sig: Take 1 tablet (500 mg total) by mouth 2 (two) times daily.    Dispense:  20 tablet    Refill:  0    -I have reviewed the patients home medicines and have made adjustments as needed    Cardiac Monitoring: The patient was maintained on a cardiac monitor.  I personally viewed and interpreted the cardiac monitored which showed an underlying rhythm of: Normal sinus rhythm  Social Determinants of Health:  Factors impacting patients care include: Lack of Access to primary care   Reevaluation: After the interventions noted above, I reevaluated the patient and  found that they have :improved  Co morbidities that complicate the patient evaluation  Past Medical History:  Diagnosis Date   Anemia    Anxiety    Chronic kidney disease    Diabetes mellitus without complication (HCC)    borderline diabetic   Hypertension          Final Clinical Impression(s) / ED Diagnoses Final diagnoses:  Nonspecific chest pain     @PCDICTATION @     [1]  Social History Tobacco Use   Smoking status: Never   Smokeless tobacco: Never  Substance Use Topics   Alcohol use: No   Drug use: No     Mannie Pac T, DO 12/05/24 1843  "

## 2024-12-05 NOTE — Discharge Instructions (Addendum)
 I have sent a prescription for Robaxin  which is a muscle relaxer.  You may use this 2 times per day.  Do not drive or operate machinery if you are taking Robaxin  as it can make you sleepy.  I also recommend that you take 1000 mg of Tylenol every 8 hours, 400 mg of ibuprofen every 6 hours.  Insert muscle relaxer  Your blood work and EKG today were normal.  Your chest x-ray was normal.  Follow-up with your primary care doctor in 1 week.

## 2025-01-13 ENCOUNTER — Ambulatory Visit: Admitting: Family Medicine

## 2025-02-04 ENCOUNTER — Ambulatory Visit: Admitting: Internal Medicine

## 2025-02-11 ENCOUNTER — Encounter: Payer: Self-pay | Admitting: Neurology
